# Patient Record
Sex: Female | Born: 1940 | Race: White | Hispanic: No | Marital: Married | Smoking: Never smoker
Health system: Southern US, Academic
[De-identification: ages and names within clinical notes are randomized; demographics above are authoritative.]

## PROBLEM LIST (undated history)

## (undated) DIAGNOSIS — I493 Ventricular premature depolarization: Secondary | ICD-10-CM

## (undated) DIAGNOSIS — I4891 Unspecified atrial fibrillation: Secondary | ICD-10-CM

## (undated) DIAGNOSIS — I480 Paroxysmal atrial fibrillation: Secondary | ICD-10-CM

## (undated) DIAGNOSIS — N189 Chronic kidney disease, unspecified: Secondary | ICD-10-CM

## (undated) DIAGNOSIS — Z87898 Personal history of other specified conditions: Secondary | ICD-10-CM

## (undated) DIAGNOSIS — M069 Rheumatoid arthritis, unspecified: Secondary | ICD-10-CM

## (undated) DIAGNOSIS — E782 Mixed hyperlipidemia: Secondary | ICD-10-CM

## (undated) DIAGNOSIS — M1990 Unspecified osteoarthritis, unspecified site: Secondary | ICD-10-CM

## (undated) DIAGNOSIS — Z8669 Personal history of other diseases of the nervous system and sense organs: Secondary | ICD-10-CM

## (undated) DIAGNOSIS — Z8739 Personal history of other diseases of the musculoskeletal system and connective tissue: Secondary | ICD-10-CM

## (undated) DIAGNOSIS — Z87442 Personal history of urinary calculi: Secondary | ICD-10-CM

## (undated) DIAGNOSIS — G4730 Sleep apnea, unspecified: Secondary | ICD-10-CM

## (undated) DIAGNOSIS — D649 Anemia, unspecified: Secondary | ICD-10-CM

## (undated) DIAGNOSIS — M810 Age-related osteoporosis without current pathological fracture: Secondary | ICD-10-CM

## (undated) DIAGNOSIS — I10 Essential (primary) hypertension: Secondary | ICD-10-CM

## (undated) DIAGNOSIS — N200 Calculus of kidney: Secondary | ICD-10-CM

## (undated) DIAGNOSIS — K5790 Diverticulosis of intestine, part unspecified, without perforation or abscess without bleeding: Secondary | ICD-10-CM

## (undated) DIAGNOSIS — L900 Lichen sclerosus et atrophicus: Secondary | ICD-10-CM

## (undated) DIAGNOSIS — E079 Disorder of thyroid, unspecified: Secondary | ICD-10-CM

## (undated) DIAGNOSIS — I482 Chronic atrial fibrillation, unspecified: Secondary | ICD-10-CM

## (undated) DIAGNOSIS — M797 Fibromyalgia: Secondary | ICD-10-CM

## (undated) HISTORY — PX: WRIST ARTHROSCOPY: SHX838

## (undated) HISTORY — PX: TONSILLECTOMY: SUR1361

## (undated) HISTORY — DX: Essential (primary) hypertension: I10

## (undated) HISTORY — DX: Age-related osteoporosis without current pathological fracture: M81.0

## (undated) HISTORY — DX: Personal history of other diseases of the nervous system and sense organs: Z86.69

## (undated) HISTORY — DX: Chronic kidney disease, unspecified: N18.9

## (undated) HISTORY — DX: Sleep apnea, unspecified: G47.30

## (undated) HISTORY — PX: HX PARATHYROIDECTOMY: SHX19

## (undated) HISTORY — PX: HX TUBAL LIGATION: SHX77

## (undated) HISTORY — DX: Personal history of other specified conditions: Z87.898

## (undated) HISTORY — DX: Paroxysmal atrial fibrillation: I48.0

## (undated) HISTORY — DX: Disorder of thyroid, unspecified: E07.9

## (undated) HISTORY — DX: Anemia, unspecified: D64.9

## (undated) HISTORY — DX: Rheumatoid arthritis, unspecified: M06.9

## (undated) HISTORY — PX: LITHOTRIPSY: SUR834

## (undated) HISTORY — DX: Personal history of urinary calculi: Z87.442

## (undated) HISTORY — DX: Unspecified osteoarthritis, unspecified site: M19.90

## (undated) HISTORY — DX: Unspecified atrial fibrillation: I48.91

## (undated) HISTORY — DX: Diverticulosis of intestine, part unspecified, without perforation or abscess without bleeding: K57.90

## (undated) HISTORY — DX: Lichen sclerosus et atrophicus: L90.0

## (undated) HISTORY — DX: Calculus of kidney: N20.0

## (undated) HISTORY — PX: HX TONSIL AND ADENOIDECTOMY: SHX28

## (undated) HISTORY — PX: HX PARTIAL HYSTERECTOMY: SHX80

## (undated) HISTORY — DX: Ventricular premature depolarization: I49.3

## (undated) HISTORY — DX: Personal history of other diseases of the musculoskeletal system and connective tissue: Z87.39

## (undated) HISTORY — PX: HX PARTIAL THYROIDECTOMY: SHX18

## (undated) HISTORY — PX: HX WRIST FRACTURE TX: SHX121

## (undated) HISTORY — PX: HX CYST INCISION AND DRAINAGE: SHX14

## (undated) HISTORY — DX: Mixed hyperlipidemia: E78.2

---

## 1978-12-01 HISTORY — PX: HX HYSTERECTOMY: SHX81

## 2000-12-01 HISTORY — PX: HX BREAST BIOPSY: SHX20

## 2010-10-22 ENCOUNTER — Encounter (INDEPENDENT_AMBULATORY_CARE_PROVIDER_SITE_OTHER): Payer: Medicare Other | Admitting: Endocrinology, Diabetes, & Metabolism

## 2010-11-04 NOTE — Progress Notes (Signed)
[facility]  [address]  [place] [address]  [phone]    Patient Name: Cynthia Barrera  MRN# [id number]    Date of Service: 11/05/2010    Chief Complaint: pt states has been on plaquenil x 10 years. Stopped taking meds after she starting having problems with her vision. Stopped it back in 9/11. Pt states that back in the spring time she got new RX with little help. Over the summer she notice seeing light and dark spots all the time. Pt notice having problems see in the dark. Had to stop driving at nighttime.     Past History  No current outpatient prescriptions on file.       Allergies not on file  No past medical history on file.  No past surgical history on file.  Family History  No family history on file.  Social History  History   Substance Use Topics   . Smoking status: Not on file   . Smokeless tobacco: Not on file   . Alcohol Use: Not on file       Review of [place], [place] 11/05/2010, 1:08 PM    Terisa Starr, MD 11/04/2010, 6:19 PM    A/P  Pt on Plaquenil, here for ganzfeld ERG per dr Lowell Guitar. 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   Has kidney problems, too much calcium in the blood with kidneystones. Could have predisposed to ? Toxicity, do not know what function is.   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    Pt has no family members with retinal dystrophies.   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  F/u with me in the summer, she prefers to come here, EOG and [place], will need GONIO upon return Letter to dr Lowell Guitar,  . Terisa Starr, MD 11/05/2010, 4:55 PM

## 2010-11-05 ENCOUNTER — Ambulatory Visit
Admission: RE | Admit: 2010-11-05 | Discharge: 2010-11-05 | Disposition: A | Discharge status: Home / Self Care | Payer: Medicare Other | Source: Ambulatory Visit | Attending: Ophthalmology | Admitting: Ophthalmology

## 2010-11-05 ENCOUNTER — Ambulatory Visit (HOSPITAL_COMMUNITY)
Admission: RE | Admit: 2010-11-05 | Discharge: 2010-11-05 | Disposition: A | Discharge status: Home / Self Care | Payer: Medicare Other | Source: Ambulatory Visit

## 2010-11-05 ENCOUNTER — Encounter (INDEPENDENT_AMBULATORY_CARE_PROVIDER_SITE_OTHER): Payer: Self-pay | Admitting: Ophthalmology

## 2010-11-05 ENCOUNTER — Ambulatory Visit (HOSPITAL_COMMUNITY)
Admission: RE | Admit: 2010-11-05 | Discharge: 2010-11-05 | Disposition: A | Discharge status: Home / Self Care | Payer: Medicare Other | Source: Ambulatory Visit | Attending: Ophthalmology | Admitting: Ophthalmology

## 2010-11-05 ENCOUNTER — Ambulatory Visit (INDEPENDENT_AMBULATORY_CARE_PROVIDER_SITE_OTHER): Payer: Medicare Other | Admitting: Ophthalmology

## 2010-11-05 DIAGNOSIS — M810 Age-related osteoporosis without current pathological fracture: Secondary | ICD-10-CM | POA: Insufficient documentation

## 2010-11-05 DIAGNOSIS — N200 Calculus of kidney: Secondary | ICD-10-CM | POA: Insufficient documentation

## 2010-11-05 DIAGNOSIS — E039 Hypothyroidism, unspecified: Secondary | ICD-10-CM | POA: Insufficient documentation

## 2010-11-05 DIAGNOSIS — I10 Essential (primary) hypertension: Secondary | ICD-10-CM | POA: Insufficient documentation

## 2010-11-05 DIAGNOSIS — Z5181 Encounter for therapeutic drug level monitoring: Secondary | ICD-10-CM | POA: Insufficient documentation

## 2010-11-05 DIAGNOSIS — E785 Hyperlipidemia, unspecified: Secondary | ICD-10-CM | POA: Insufficient documentation

## 2010-11-05 DIAGNOSIS — H5359 Other color vision deficiencies: Secondary | ICD-10-CM | POA: Insufficient documentation

## 2010-11-05 NOTE — Procedures (Signed)
Flying saucer sign, subtle RPE  Changes OU. No CME, no ERM, VMT OU. 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 Bull's eye appearance.Terisa Starr, MD 11/05/2010, 4:12 PM

## 2010-11-05 NOTE — Procedures (Signed)
Electrophysiology does show almost flat scotopic responses, about 15 % of normal. Generally normal for peak times. Light adapted within normal limits for amplitudes but delayed peak times. Terisa Starr, MD 11/05/2010, 6:36 PM

## 2010-11-05 NOTE — Procedures (Signed)
Blunted reflex OU. Normal optic nerve, no pigment migration, no bone spics.   Neil Crouch, MD 11/05/2010, 5:33 PM    I saw and examined the patient.  I reviewed the resident's note.  I agree with the findings and plan of care as documented in the resident's note.  Any exceptions/additions are edited/noted.    Terisa Starr, MD 11/05/2010, 6:31 PM

## 2010-12-12 ENCOUNTER — Ambulatory Visit (INDEPENDENT_AMBULATORY_CARE_PROVIDER_SITE_OTHER): Payer: Self-pay | Admitting: Ophthalmology

## 2011-01-06 ENCOUNTER — Encounter (INDEPENDENT_AMBULATORY_CARE_PROVIDER_SITE_OTHER): Payer: Medicare Other | Admitting: Ophthalmology

## 2011-01-20 ENCOUNTER — Encounter (INDEPENDENT_AMBULATORY_CARE_PROVIDER_SITE_OTHER): Payer: Medicare Other | Admitting: Endocrinology, Diabetes, & Metabolism

## 2011-01-24 ENCOUNTER — Encounter (INDEPENDENT_AMBULATORY_CARE_PROVIDER_SITE_OTHER): Payer: Medicare Other | Admitting: Endocrinology, Diabetes, & Metabolism

## 2011-04-25 ENCOUNTER — Encounter (INDEPENDENT_AMBULATORY_CARE_PROVIDER_SITE_OTHER): Payer: Medicare Other | Admitting: Endocrinology, Diabetes, & Metabolism

## 2011-05-08 ENCOUNTER — Encounter (INDEPENDENT_AMBULATORY_CARE_PROVIDER_SITE_OTHER): Payer: Medicare Other | Admitting: Endocrinology, Diabetes, & Metabolism

## 2011-05-09 ENCOUNTER — Encounter (INDEPENDENT_AMBULATORY_CARE_PROVIDER_SITE_OTHER): Payer: Medicare Other | Admitting: Endocrinology, Diabetes, & Metabolism

## 2011-05-13 ENCOUNTER — Encounter (INDEPENDENT_AMBULATORY_CARE_PROVIDER_SITE_OTHER): Payer: Medicare Other | Admitting: Ophthalmology

## 2011-05-20 ENCOUNTER — Encounter (INDEPENDENT_AMBULATORY_CARE_PROVIDER_SITE_OTHER): Payer: Medicare Other | Admitting: Ophthalmology

## 2011-08-07 ENCOUNTER — Encounter (INDEPENDENT_AMBULATORY_CARE_PROVIDER_SITE_OTHER): Payer: Self-pay | Admitting: Endocrinology, Diabetes, & Metabolism

## 2011-08-15 ENCOUNTER — Encounter (INDEPENDENT_AMBULATORY_CARE_PROVIDER_SITE_OTHER): Payer: Self-pay | Admitting: Endocrinology, Diabetes, & Metabolism

## 2011-08-15 ENCOUNTER — Ambulatory Visit (INDEPENDENT_AMBULATORY_CARE_PROVIDER_SITE_OTHER): Payer: Medicare Other | Admitting: Endocrinology, Diabetes, & Metabolism

## 2011-08-15 VITALS — BP 124/78 | HR 68 | Ht 63.0 in | Wt 179.4 lb

## 2011-08-15 MED ORDER — IBUPROFEN 200 MG TABLET
200.0000 mg | ORAL_TABLET | Freq: Two times a day (BID) | ORAL | Status: DC
Start: 2011-08-15 — End: 2012-09-17

## 2011-08-15 MED ORDER — CARVEDILOL 25 MG TABLET
25.0000 mg | ORAL_TABLET | Freq: Two times a day (BID) | ORAL | Status: DC
Start: 2011-08-15 — End: 2011-12-03

## 2011-08-15 MED ORDER — DILTIAZEM CD 240 MG CAPSULE,EXTENDED RELEASE 24 HR
240.0000 mg | ORAL_CAPSULE | Freq: Every day | ORAL | Status: DC
Start: 2011-08-15 — End: 2011-12-03

## 2011-08-15 MED ORDER — SYNTHROID 100 MCG TABLET
100.0000 ug | ORAL_TABLET | Freq: Every day | ORAL | Status: DC
Start: 2011-08-15 — End: 2011-12-03

## 2011-08-15 NOTE — Progress Notes (Signed)
1. Hypertension:    The patient returns for routine follow-up with home blood pressure recordings variable but generally acceptable on continued carvedilol 25 mg BID, Cardizem CD 240 mg and lisinopril 40 mg Qam. Some of the variability may be attributed to persistent use of ibuprofen 200 mg BID which has been utilized in place of plaquenil    2. Hypothyroid, continued Synthroid 100 mcg Qam, stable    3. 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 Plaquenil toxicity:    Cynthia Barrera notes no significant change in visual problems but feels that there may be some worsening. She is scheduled to return for follow-up at [facility] next week with a report to follow.    5. ? Glucose intolerance, random glucose 129 mg/dl    The study was on a non-fasting sample but raises minor concerns due to associated risk factors. Cynthia Barrera will monitor fasting and 2h post-prandial glucoses at home

## 2011-11-14 ENCOUNTER — Encounter (INDEPENDENT_AMBULATORY_CARE_PROVIDER_SITE_OTHER): Payer: Medicare Other | Admitting: Endocrinology, Diabetes, & Metabolism

## 2011-12-03 ENCOUNTER — Ambulatory Visit (INDEPENDENT_AMBULATORY_CARE_PROVIDER_SITE_OTHER): Payer: Medicare Other | Admitting: Endocrinology, Diabetes, & Metabolism

## 2011-12-03 MED ORDER — LISINOPRIL 40 MG TABLET
40.0000 mg | ORAL_TABLET | Freq: Every day | ORAL | Status: DC
Start: 2011-12-03 — End: 2012-03-12

## 2011-12-03 MED ORDER — SYNTHROID 100 MCG TABLET
100.0000 ug | ORAL_TABLET | Freq: Every day | ORAL | Status: DC
Start: 2011-12-03 — End: 2012-03-12

## 2011-12-03 MED ORDER — DILTIAZEM CD 240 MG CAPSULE,EXTENDED RELEASE 24 HR
240.0000 mg | ORAL_CAPSULE | Freq: Every day | ORAL | Status: DC
Start: 2011-12-03 — End: 2012-03-12

## 2011-12-03 MED ORDER — CARVEDILOL 25 MG TABLET
25.0000 mg | ORAL_TABLET | Freq: Two times a day (BID) | ORAL | Status: DC
Start: 2011-12-03 — End: 2012-10-11

## 2011-12-03 NOTE — Progress Notes (Signed)
1. Hypertension:   The patient returns for routine follow-up with home monitoring stable with generally normal pressures and occasional elevations thought to be secondary to increased salt intake such as ham, chocolate covered pretzels, etc. during the recent holidays. She continues carvedilol 25 mg BID, Cardizem CD 240 mg and lisinopril 40 mg Qam. Some of the variability may be attributed to persistent use of ibuprofen 200 mg BID which has been utilized in place of plaquenil. Weight was stable.   2. Hypothyroid, continued Synthroid 100 mcg Qam, stable   3. 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 Plaquenil toxicity:   Cynthia Barrera notes no significant change in visual problems but feels that there may be some worsening. She is scheduled to return for follow-up at [facility] next week with a report to follow (not received). Cynthia Barrera stated that there was "no change" and will return on a yearly basis with her next appointment in September 2013.  5. ? Glucose intolerance, random glucose 129 mg/dl   The study was on a non-fasting sample but raises minor concerns due to associated risk factors. Cynthia Barrera will monitor fasting and 2h post-prandial glucoses at home. The glucose log was reported anecdotally and considered "normal" by the patient. We'll continue weekly home monitoring and recording but also obtain HbA1c with the next lab draw.

## 2012-03-03 ENCOUNTER — Encounter (INDEPENDENT_AMBULATORY_CARE_PROVIDER_SITE_OTHER): Payer: Medicare Other | Admitting: Endocrinology, Diabetes, & Metabolism

## 2012-03-12 ENCOUNTER — Encounter (INDEPENDENT_AMBULATORY_CARE_PROVIDER_SITE_OTHER): Payer: Self-pay | Admitting: Endocrinology, Diabetes, & Metabolism

## 2012-03-12 ENCOUNTER — Ambulatory Visit (INDEPENDENT_AMBULATORY_CARE_PROVIDER_SITE_OTHER): Payer: Medicare Other | Admitting: Endocrinology, Diabetes, & Metabolism

## 2012-03-12 VITALS — BP 144/86 | HR 73 | Ht 64.0 in | Wt 182.0 lb

## 2012-03-12 MED ORDER — DILTIAZEM CD 240 MG CAPSULE,EXTENDED RELEASE 24 HR
240.0000 mg | ORAL_CAPSULE | Freq: Every day | ORAL | Status: DC
Start: 2012-03-12 — End: 2013-02-14

## 2012-03-12 MED ORDER — SYNTHROID 100 MCG TABLET
100.0000 ug | ORAL_TABLET | Freq: Every day | ORAL | Status: DC
Start: 2012-03-12 — End: 2013-02-14

## 2012-03-12 MED ORDER — LISINOPRIL 40 MG TABLET
40.0000 mg | ORAL_TABLET | Freq: Every day | ORAL | Status: DC
Start: 2012-03-12 — End: 2013-02-11

## 2012-03-12 NOTE — Progress Notes (Signed)
 1. Hypertension:   The patient returns for routine follow-up with home monitoring stable with generally normal pressures and occasional elevations thought to be secondary to increased salt intake such as ham, chocolate covered pretzels, etc. during the recent holidays. She continues carvedilol  25 mg BID, Cardizem  CD 240 mg and lisinopril  40 mg Qam. Some of the variability may be attributed to persistent use of ibuprofen  200 mg BID which has been utilized in place of plaquenil. Weight was stable but reflected a two pound increase.  2. Hypothyroid, continued Synthroid  100 mcg Qam, stable   3. Hyperparathyroid, stable, serum Ca++ 10.3 mg/dl   4. Plaquenil toxicity:   Sayana notes no significant change in visual problems but feels that there may be some worsening. She is scheduled to return for follow-up at [facility] with her next appointment in September 2013.  5. ? Glucose intolerance, random glucose 129 mg/dl   The study was on a non-fasting sample but raises minor concerns due to associated risk factors. Kalyani will monitor fasting and 2h post-prandial glucoses at home. The glucose log was reported anecdotally and considered normal by the patient. We'll continue weekly home monitoring and recording noting that HbA1c was reported as 5.8% and BMP was essentially normal with a random glucose of 89 mg/dl.  6. Osteoporosis:  DEXA scan obtained recently was less than ideal interpretation wise but was consistent with osteoporosis with a T score of -2.8 in the left hip area. She also had follow-up mammography with additional follow-up pending due to grey area in the left breast.

## 2012-06-11 ENCOUNTER — Encounter (INDEPENDENT_AMBULATORY_CARE_PROVIDER_SITE_OTHER): Payer: Self-pay | Admitting: Endocrinology, Diabetes, & Metabolism

## 2012-06-21 ENCOUNTER — Encounter (INDEPENDENT_AMBULATORY_CARE_PROVIDER_SITE_OTHER): Payer: Medicare Other | Admitting: Endocrinology, Diabetes, & Metabolism

## 2012-07-29 ENCOUNTER — Encounter (INDEPENDENT_AMBULATORY_CARE_PROVIDER_SITE_OTHER): Payer: Medicare Other | Admitting: Endocrinology, Diabetes, & Metabolism

## 2012-09-17 ENCOUNTER — Encounter (INDEPENDENT_AMBULATORY_CARE_PROVIDER_SITE_OTHER): Payer: Self-pay | Admitting: Endocrinology, Diabetes, & Metabolism

## 2012-09-17 ENCOUNTER — Ambulatory Visit (INDEPENDENT_AMBULATORY_CARE_PROVIDER_SITE_OTHER): Payer: Medicare Other | Admitting: Endocrinology, Diabetes, & Metabolism

## 2012-09-17 VITALS — BP 128/66 | HR 79 | Ht 64.0 in | Wt 180.6 lb

## 2012-09-17 NOTE — Progress Notes (Signed)
 1. Hypertension:   The patient returns for routine follow-up with home monitoring stable with generally normal pressures and occasional elevations thought to be secondary to increased salt intake such as ham, chocolate covered pretzels, etc. during the recent holidays. She continues carvedilol  25 mg BID, Cardizem  CD 240 mg and lisinopril  40 mg Qam. Some of the variability may be attributed to persistent use of ibuprofen  200 mg BID which has been utilized in place of plaquenil. Weight was stable but reflected a two pound increase.  2. Hypothyroid, continued Synthroid  100 mcg Qam, stable with annual studies to be obtained today  3. Hyperparathyroid, stable, serum Ca++ 10.3 mg/dl, status post renal calculi last year  4. Plaquenil toxicity:   Koriana notes no significant change in visual problems but feels that there may be some worsening. She is scheduled to return for follow-up at [facility] with her next appointment in September 2013.  5. ? Glucose intolerance, random glucose 129 mg/dl   The study was on a non-fasting sample but raises minor concerns due to associated risk factors. Shamona will monitor fasting and 2h post-prandial glucoses at home. The glucose log was reported anecdotally and considered normal by the patient. We'll continue weekly home monitoring and recording noting that HbA1c was reported as 5.8% and BMP was essentially normal with a random glucose of 89 mg/dl.  6. Osteoporosis:  DEXA scan obtained recently with less than an ideal interpretation but was consistent with osteoporosis with a T score of -2.8 in the left hip area. She also had follow-up mammography with additional follow-up pending due to grey area in the left breast. Dr. Dagostine suggested she discuss supplements with me during this visit. Vitamin D  levels should be measured but I seriously doubt that calcium supplement is indicated due to established hyperparathyroidism and a recent history of renal calculi

## 2012-10-09 ENCOUNTER — Other Ambulatory Visit (INDEPENDENT_AMBULATORY_CARE_PROVIDER_SITE_OTHER): Payer: Self-pay | Admitting: Endocrinology, Diabetes, & Metabolism

## 2013-02-11 ENCOUNTER — Other Ambulatory Visit (INDEPENDENT_AMBULATORY_CARE_PROVIDER_SITE_OTHER): Payer: Self-pay | Admitting: Endocrinology, Diabetes, & Metabolism

## 2013-02-11 MED ORDER — LISINOPRIL 40 MG TABLET
40.0000 mg | ORAL_TABLET | Freq: Every day | ORAL | Status: DC
Start: 2013-02-11 — End: 2013-08-02

## 2013-02-14 ENCOUNTER — Encounter (INDEPENDENT_AMBULATORY_CARE_PROVIDER_SITE_OTHER): Payer: Self-pay | Admitting: Endocrinology, Diabetes, & Metabolism

## 2013-02-14 ENCOUNTER — Ambulatory Visit (INDEPENDENT_AMBULATORY_CARE_PROVIDER_SITE_OTHER): Payer: Medicare Other | Admitting: Endocrinology, Diabetes, & Metabolism

## 2013-02-14 VITALS — BP 150/82 | HR 79 | Ht 64.0 in | Wt 174.4 lb

## 2013-02-14 NOTE — Progress Notes (Signed)
1. Hypertension:   The patient returns after recent hospitalization at [facility] for apparent bronchitis/pneumonia with the development of new onset atrial fibrillation. As a consequence several medications were changed including discontinuation of carvedilol, cardizem CD and lisinopril. Her husband stopped by the office on Friday (3/14) noting significant BP elevation and not feeling well. I restarted lisinopril and questioned why metoprolol had been substituted for carvedilol. ECHOcardiogram revealed an ejection fraction of 55% without valvular lesions. Medications prescribed for "bronchitis" included promethazine, benzonatate, loratadine and cefuroxime. Note also that serum K+ was 3.2 mEq/L on admission likely due to chronic furosemide use. She has been referred to Dr. Delia Chimes for further evaluation but his office wanted clarification of thyroid status before evaluation. EKG in the office was consistent with sinus bradycardia.  Chest X-ray raised the question of a right middle lobe and lingular infiltrate at [facility] but certainly did not confirm pneumonia. She has now completed her antibiotic course, feels better but still complains of a dry, non-productive cough. Repeat chest PA-lateral will be obtained today.  2. Hypothyroid:  Synthroid was reduced from 100 mcg Qam to 75 mcg Qam based on [facility] studies including TSH 0.474 mIU/mL (normal 0.370-4.420) and free thyroxine 2.10 ng/dL (normal 0.75-2.00). This is a considerably greater reduction than I would have recommended if a reduction was indicated at all. Since she has now taken the 75 mcg dosage for 2 weeks we'll defer testing until an additional six weeks have passed then re-evaluate.  3. Hyperparathyroid, stable, serum Ca++ 10.0 mg/dl, status post renal calculi last year  4. Plaquenil toxicity:    Storme notes no significant change in visual problems but feels that there may be some worsening. She is scheduled to return for follow-up at [facility] with her next appointment in September 2013.  5. ? Glucose intolerance, random glucose 129 mg/dl   The study was on a non-fasting sample but raises minor concerns due to associated risk factors. Mitsy will monitor fasting and 2h post-prandial glucoses at home. The glucose log was reported anecdotally and considered "normal" by the patient. We'll continue weekly home monitoring and recording noting that HbA1c was reported as 5.8% and BMP was essentially normal with a random glucose of 89 mg/dl.  6. Osteoporosis:  DEXA scan obtained recently with less than an ideal interpretation but was consistent with osteoporosis with a T score of -2.8 in the left hip area. She also had follow-up mammography with additional follow-up pending due to "grey" area in the left breast. Dr. Frederic Jericho suggested she discuss "supplements" with me during this visit. Vitamin D levels should be measured but I seriously doubt that calcium supplement is indicated due to established hyperparathyroidism and a recent history of renal calculi

## 2013-03-07 ENCOUNTER — Ambulatory Visit (INDEPENDENT_AMBULATORY_CARE_PROVIDER_SITE_OTHER): Payer: Medicare Other | Admitting: Endocrinology, Diabetes, & Metabolism

## 2013-03-07 ENCOUNTER — Encounter (INDEPENDENT_AMBULATORY_CARE_PROVIDER_SITE_OTHER): Payer: Self-pay | Admitting: Endocrinology, Diabetes, & Metabolism

## 2013-03-07 VITALS — BP 124/70 | HR 55 | Resp 20 | Ht 64.0 in | Wt 172.8 lb

## 2013-03-07 MED ORDER — LEVOTHYROXINE 75 MCG TABLET
75.0000 ug | ORAL_TABLET | Freq: Every day | ORAL | Status: DC
Start: 2013-03-07 — End: 2014-05-16

## 2013-03-07 NOTE — Progress Notes (Signed)
1. Hypertension:   The patient returns after recent hospitalization at [facility] for hypertensive urgency, atrial fibrillation and persistent nephrolithiasis. She has also had at least two falls recently that appeared to be orthostatic in nature. The most recent occurred in the hospital parking lot where she fell face forward when getting out of her car. As a consequence, medications have again been revised with initiation of clonidine 0.1 mg TID, sotalol 80 mg BID and digoxin 0.25 mg QD. Lisinopril 40 mg Qam was continued as was HCTz 25 mg daily and amlodipine 5 mg.  On this regimen, BP control became "too good" and she held the noon dosage of clonidine and the evening dosage of sotalol.   She has been referred to Dr. Delia Chimes for further evaluation but his office wanted clarification of thyroid status before evaluation and has initiated a cardiac monitor which she is currently using. Atrial fibrillation diagnosed during a previous admission to [facility] for bronchitis has not been a problem and sinus rhythm has been maintained. EKG in the office was consistent with sinus bradycardia.  2. Hypothyroid:  Synthroid was reduced from 100 mcg Qam to 75 mcg Qam based on [facility] [place] studies including TSH 0.474 mIU/mL (normal 0.370-4.420) and free thyroxine 2.10 ng/dL (normal 0.75-2.00). This is a considerably greater reduction than I would have recommended if a reduction was indicated at all. Since she has now taken the 75 mcg dosage for 8 weeks - the TSH obtained during the April hospitalization will suffice - TSH was normal at 2.13 uIU/mL.  3. Hyperparathyroid, stable, serum Ca++ 10.0 mg/dl, status post renal calculi last year, Braleigh is a surgical candidate for all intents and purposes but to date has not elected this avenue:  4. Plaquenil toxicity:    Arthi notes no significant change in visual problems but feels that there may be some worsening. She is scheduled to return for follow-up at [facility] with her next appointment in September 2013.  5. ? Glucose intolerance, random glucose 129 mg/dl   The study was on a non-fasting sample but raises minor concerns due to associated risk factors. Merisha will monitor fasting and 2h post-prandial glucoses at home. The glucose log was reported anecdotally and considered "normal" by the patient. We'll continue weekly home monitoring and recording noting that HbA1c was reported as 5.8% and BMP was essentially normal with a random glucose of 89 mg/dl.  6. Osteoporosis:  DEXA scan obtained recently with less than an ideal interpretation but was consistent with osteoporosis with a T score of -2.8 in the left hip area. She also had follow-up mammography with additional follow-up pending due to "grey" area in the left breast. Dr. Frederic Jericho suggested she discuss "supplements" with me during this visit. Vitamin D levels should be measured but I seriously doubt that calcium supplement is indicated due to established hyperparathyroidism and a recent history of renal calculi

## 2013-03-10 ENCOUNTER — Telehealth (INDEPENDENT_AMBULATORY_CARE_PROVIDER_SITE_OTHER): Payer: Self-pay | Admitting: Endocrinology, Diabetes, & Metabolism

## 2013-03-10 DIAGNOSIS — M6250 Muscle wasting and atrophy, not elsewhere classified, unspecified site: Secondary | ICD-10-CM

## 2013-03-10 DIAGNOSIS — R079 Chest pain, unspecified: Secondary | ICD-10-CM

## 2013-03-10 DIAGNOSIS — E860 Dehydration: Secondary | ICD-10-CM

## 2013-03-10 DIAGNOSIS — R112 Nausea with vomiting, unspecified: Secondary | ICD-10-CM

## 2013-03-10 DIAGNOSIS — I4891 Unspecified atrial fibrillation: Secondary | ICD-10-CM

## 2013-03-10 DIAGNOSIS — I10 Essential (primary) hypertension: Secondary | ICD-10-CM

## 2013-03-10 DIAGNOSIS — R109 Unspecified abdominal pain: Secondary | ICD-10-CM

## 2013-03-10 DIAGNOSIS — K5900 Constipation, unspecified: Secondary | ICD-10-CM

## 2013-03-10 NOTE — Telephone Encounter (Signed)
Patient's daughter called and wanted you to know that she is back in the hospital at [facility] in [phone]. They told her that her heart rate is erratic and her blood pressure is all over the place. She also has some inflammation around her pancreas. Her daughter was asking about her renal function, but has been unable to find out if they have checked it or not.

## 2013-03-10 NOTE — Telephone Encounter (Signed)
Melanie:    Baelynn's renal function has been checked several times during her recent hospitalization. On 03/01/13, her BUN was 18, creatinine 0.7 and the estimated GFR > 60 then on 03/09/13, the BUN was 18, creatinine 1.0 and estimated GFR 55. These studies suggested mild renal impairment (CKD III).    Through this office, studies were ordered on 02/14/13 as well as 09/17/13 - the last two times I saw Aislynne in the office. Neither of these studies were obtained after being ordered.     [place]

## 2013-03-15 ENCOUNTER — Telehealth (INDEPENDENT_AMBULATORY_CARE_PROVIDER_SITE_OTHER): Payer: Self-pay | Admitting: Endocrinology, Diabetes, & Metabolism

## 2013-03-15 NOTE — Telephone Encounter (Signed)
Spoke to patient for hospital follow up. She is doing some better. She doesn't need anything at the present time. She is trying to find a family doctor, but hasn't found one yet. She is aware of her follow up appt.

## 2013-03-18 ENCOUNTER — Encounter (INDEPENDENT_AMBULATORY_CARE_PROVIDER_SITE_OTHER): Payer: Medicare Other | Admitting: Endocrinology, Diabetes, & Metabolism

## 2013-03-18 ENCOUNTER — Ambulatory Visit (INDEPENDENT_AMBULATORY_CARE_PROVIDER_SITE_OTHER): Payer: Medicare Other | Admitting: Endocrinology, Diabetes, & Metabolism

## 2013-03-18 VITALS — BP 140/82 | HR 80 | Ht 64.0 in | Wt 171.8 lb

## 2013-03-18 DIAGNOSIS — E213 Hyperparathyroidism, unspecified: Secondary | ICD-10-CM

## 2013-03-18 LAB — BASIC METABOLIC PANEL
BUN: 17 mg/dL — NL (ref 7–18)
CALCIUM: 10.2 mg/dL — ABNORMAL HIGH (ref 8.5–10.1)
CARBON DIOXIDE: 29 mmol/L — NL (ref 21–32)
CHLORIDE: 99 mmoL/L (ref 98–107)
CREATININE: 1.2 mg/dL — NL (ref 0.6–1.3)
ESTIMATED GLOMERULAR FILTRATION RATE: 44 mL/min (ref >60)
GLUCOSE,NONFAST: 153 mg/dL — ABNORMAL HIGH (ref 74–106)
POTASSIUM: 3.4 mmol/L — ABNORMAL LOW (ref 3.5–5.1)
SODIUM: 134 mmol/L — ABNORMAL LOW (ref 136–145)

## 2013-03-18 NOTE — Progress Notes (Signed)
 1. Hypertension:   The patient returns after recent hospitalization at [facility] for hypertensive urgency, atrial fibrillation and persistent nephrolithiasis. She has also had at least two falls recently that appeared to be orthostatic in nature. The most recent occurred in the hospital parking lot where she fell face forward when getting out of her car. As a consequence, medications have again been revised with initiation of clonidine  0.1 mg TID, sotalol  80 mg BID and digoxin 0.25 mg QD. Lisinopril  40 mg Qam was continued as was HCTz 25 mg daily and amlodipine  5 mg. On this regimen, BP control became too good and she held the noon dosage of clonidine  and the evening dosage of sotalol .   She has been referred to Dr. Maurie for further evaluation but his office wanted clarification of thyroid  status before evaluation and has initiated a cardiac monitor which she is currently using. Atrial fibrillation diagnosed during a previous admission to [facility] for bronchitis has not been a problem and sinus rhythm has been maintained. EKG in the office was consistent with sinus bradycardia.  Since the above was recorded, Cynthia Barrera was again evaluated per ED with dig toxicity documented. She has since discontinued digoxin and done considerably better. BP, if anything, have been lower and Cynthia Barrera keeps jiggling her medications. Her husband is keeping a list of what she is not taking on a day-to-day basis. Hopefully, we can stabilize the medications once again and maintain satisfactory BP control. I've asked Cynthia Barrera to bring her BP cuff to her next appointment for comparison.  2. Hypothyroid:  Synthroid  was reduced from 100 mcg Qam to 75 mcg Qam based on [facility] studies including TSH 0.474 mIU/mL (normal 0.370-4.420) and free thyroxine 2.10 ng/dL (normal 9.24-7.99). This is a considerably greater reduction than I would have recommended if a reduction was indicated at all. Since she has now taken the 75 mcg dosage  for 8 weeks - the TSH obtained during the April hospitalization will suffice - TSH was normal at 2.13 uIU/mL.  3. Hyperparathyroid, stable:  Serum Ca++ 10.0 mg/dl, status post renal calculi last year with a recurrent stone awaiting lithotripsy. There is no question that Cynthia Barrera is a surgical candidate for all intents and purposes but to date has not elected this avenue. We'll repeat serum calcium today and evaluate renal function with potential parathyroid  scan on return.  4. Plaquenil toxicity:   Cynthia Barrera notes no significant change in visual problems but feels that there may be some worsening. She is scheduled to return for follow-up at [facility] with consideration for cataract removal if feasible  5. ? Glucose intolerance, random glucose 129 mg/dl   The study was on a non-fasting sample but raises minor concerns due to associated risk factors. Cynthia Barrera will monitor fasting and 2h post-prandial glucoses at home. The glucose log was reported anecdotally and considered normal by the patient. We'll continue weekly home monitoring and recording noting that HbA1c was reported as 5.8% and BMP was essentially normal with a random glucose of 89 mg/dl.  6. Osteoporosis:  DEXA scan obtained recently with less than an ideal interpretation but was consistent with osteoporosis with a T score of -2.8 in the left hip area. She also had follow-up mammography with additional follow-up pending due to grey area in the left breast. Dr. Dagostine suggested she discuss supplements with me during this visit. Vitamin D  levels should be measured but I seriously doubt that calcium supplement is indicated due to established hyperparathyroidism and a recent history of renal calculi

## 2013-04-08 ENCOUNTER — Other Ambulatory Visit (INDEPENDENT_AMBULATORY_CARE_PROVIDER_SITE_OTHER): Payer: Self-pay | Admitting: Endocrinology, Diabetes, & Metabolism

## 2013-04-08 MED ORDER — METOPROLOL SUCCINATE ER 25 MG TABLET,EXTENDED RELEASE 24 HR
25.0000 mg | ORAL_TABLET | Freq: Every day | ORAL | Status: DC
Start: 2013-04-08 — End: 2013-08-02

## 2013-04-08 MED ORDER — SOTALOL 80 MG TABLET
80.0000 mg | ORAL_TABLET | Freq: Two times a day (BID) | ORAL | Status: DC
Start: 2013-04-08 — End: 2016-03-27

## 2013-04-08 MED ORDER — HYDROCHLOROTHIAZIDE 25 MG TABLET
25.0000 mg | ORAL_TABLET | Freq: Every day | ORAL | Status: DC
Start: 2013-04-08 — End: 2014-10-10

## 2013-04-29 ENCOUNTER — Encounter (INDEPENDENT_AMBULATORY_CARE_PROVIDER_SITE_OTHER): Payer: Self-pay | Admitting: Endocrinology, Diabetes, & Metabolism

## 2013-04-29 ENCOUNTER — Ambulatory Visit (INDEPENDENT_AMBULATORY_CARE_PROVIDER_SITE_OTHER): Payer: Medicare Other | Admitting: Endocrinology, Diabetes, & Metabolism

## 2013-04-29 VITALS — BP 130/78 | HR 60 | Ht 64.0 in | Wt 171.4 lb

## 2013-04-29 NOTE — Progress Notes (Signed)
 1. Hypertension:   The patient returns after recent hospitalization at [facility] for hypertensive urgency, atrial fibrillation and persistent nephrolithiasis. Since her last visit, with medication adjustment, she has done considerably better with stable home BP monitoring and no further falls. Arayna continues sotalol  80 mg BID, lisinopril  40 mg Qam and HCTz 25 mg daily. Note that clonidine  and amlodipine  have been stopped. On this regimen, BP control reviewed through her daily log has essentially returned to normal with only a few, transient exeptions. She continues follow-up with Dr. Maurie for further evaluation but his office wanted clarification of thyroid  status before evaluation and has initiated a cardiac monitor which she is currently using. Atrial fibrillation diagnosed during a previous admission to [facility] for bronchitis has not been a problem and sinus rhythm has been maintained.   2. Hypothyroid:  Synthroid  was reduced from 100 mcg Qam to 75 mcg Qam based on [facility] studies including TSH 0.474 mIU/mL (normal 0.370-4.420) and free thyroxine 2.10 ng/dL (normal 9.24-7.99). This is a considerably greater reduction than I would have recommended if a reduction was indicated at all. Since she has now taken the 75 mcg dosage for 8 weeks - the TSH obtained during the April hospitalization will suffice - TSH was normal at 2.13 uIU/mL. Further confirmation will be considered on return.  3. Hyperparathyroid, stable, serum Ca++ 10.2 mg/dl, status post renal calculi last year, Skyleigh is a surgical candidate for all intents and purposes but to date has not elected this avenue:  4. Plaquenil toxicity:   Dominic notes no significant change in visual problems but feels that there may be some worsening. She is scheduled to return for follow-up at [facility] with her next appointment in September 2013.  5. ? Glucose intolerance, random glucose 129 mg/dl   The study was on a non-fasting sample but raises  minor concerns due to associated risk factors. Heidee will monitor fasting and 2h post-prandial glucoses at home. The glucose log was reported anecdotally and considered normal by the patient. We'll continue weekly home monitoring and recording noting that HbA1c was reported as 5.8% and BMP was essentially normal with a random glucose of 89 mg/dl.  6. Osteoporosis:  DEXA scan obtained recently with less than an ideal interpretation but was consistent with osteoporosis with a T score of -2.8 in the left hip area. She also had follow-up mammography with additional follow-up pending due to grey area in the left breast. Dr. Dagostine suggested she discuss supplements with me during this visit. Vitamin D  levels should be measured but I seriously doubt that calcium supplement is indicated due to established hyperparathyroidism and a recent history of renal calculi.  7. Renal calculi, secondary to hyperparathyroidism:  Grier is scheduled to undergo urethral dilatation as well as lithotripsy within the next week with antiplatelet therapy to be discussed with Dr. Belle office.

## 2013-06-09 ENCOUNTER — Other Ambulatory Visit (INDEPENDENT_AMBULATORY_CARE_PROVIDER_SITE_OTHER): Payer: Self-pay | Admitting: Endocrinology, Diabetes, & Metabolism

## 2013-06-09 MED ORDER — CLONIDINE HCL 0.1 MG TABLET
0.10 mg | ORAL_TABLET | Freq: Two times a day (BID) | ORAL | Status: DC
Start: 2013-06-09 — End: 2014-05-16

## 2013-06-30 ENCOUNTER — Ambulatory Visit (INDEPENDENT_AMBULATORY_CARE_PROVIDER_SITE_OTHER): Payer: Self-pay | Admitting: Internal Medicine

## 2013-08-02 ENCOUNTER — Ambulatory Visit (INDEPENDENT_AMBULATORY_CARE_PROVIDER_SITE_OTHER): Payer: Medicare Other | Admitting: Endocrinology, Diabetes, & Metabolism

## 2013-08-02 ENCOUNTER — Encounter (INDEPENDENT_AMBULATORY_CARE_PROVIDER_SITE_OTHER): Payer: Self-pay | Admitting: Endocrinology, Diabetes, & Metabolism

## 2013-08-02 VITALS — BP 122/80 | HR 83 | Ht 64.0 in | Wt 174.4 lb

## 2013-08-02 LAB — BASIC METABOLIC PANEL
BUN: 28 mg/dL — ABNORMAL HIGH (ref 7–18)
CALCIUM: 10.1 mg/dL (ref 8.5–10.1)
CARBON DIOXIDE: 27 mmoL/L (ref 21–32)
CHLORIDE: 102 mmol/L (ref 98–107)
CREATININE: 1.3 mg/dL (ref 0.6–1.3)
ESTIMATED GLOMERULAR FILTRATION RATE: 40 mL/min (ref >60)
GLUCOSE,NONFAST: 78 mg/dL (ref 74–106)
POTASSIUM: 4 mmol/L (ref 3.5–5.1)
SODIUM: 136 mmoL/L (ref 136–145)

## 2013-08-02 LAB — HGA1C (HEMOGLOBIN A1C WITH EST AVG GLUCOSE): HEMOGLOBIN A1C: 6.5 %

## 2013-08-02 NOTE — Progress Notes (Signed)
1. Hypertension:   The patient returns after recent hospitalization at [facility] for hypertensive urgency, atrial fibrillation and persistent nephrolithiasis. Since her last visit, with medication adjustment, she has done considerably better with stable home BP monitoring and no further falls. Cynthia Barrera continues sotalol 80 mg BID, lisinopril 40 mg Qam and HCTz 25 mg daily. Note that clonidine and amlodipine have been stopped. On this regimen, BP control reviewed through her daily log has essentially returned to normal with only a few, transient exeptions. She continues follow-up with Dr. Delia Chimes for further evaluation but his office wanted clarification of thyroid status before evaluation and has initiated a cardiac monitor which she is currently using. Atrial fibrillation diagnosed during a previous admission to [place] for bronchitis has not been a problem and sinus rhythm has been maintained.   Dr. Georg Ruddle office stopped HCTz, Toprol and lisinopril and started Tenoretic 50/25 1/2 tablet Qam, Azor 10/40 mg daily and Aldactone 25 mg 1/2 tab daily but when these were taken there was a hypotensive response therefore she stopped them and returned to [facility] and sotalol with clonidine on a prn basis when BP's are greater than 150/90 mmHg. Blood pressure today was 122/80 mmHg. In addition to her decision to change therapies, another factor was the discontinuation of soft drinks, i.e., diet coke, with a reduced sodium load. .  2. Hypothyroid:    Synthroid was reduced from 100 mcg Qam to 75 mcg Qam based on [place] studies including TSH 0.474 mIU/mL (normal 0.370-4.420) and free thyroxine 2.10 ng/dL (normal 0.75-2.00). This is a considerably greater reduction than I would have recommended if a reduction was indicated at all. Since she has now taken the 75 mcg dosage for 8 weeks - the TSH obtained during the April hospitalization will suffice - TSH was normal at 2.13 uIU/mL. Further confirmation will be considered on return.  3. 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 Plaquenil toxicity:   Cynthia Barrera notes no significant change in visual problems but feels that there may be some worsening. She is scheduled to return for follow-up at [facility] with her next appointment in September 2013.  5. ? Glucose intolerance, random glucose 129 mg/dl   The study was on a non-fasting sample but raises minor concerns due to associated risk factors. Cynthia Barrera will monitor fasting and 2h post-prandial glucoses at home. The glucose log was reported anecdotally and considered "normal" by the patient. We'll continue weekly home monitoring and recording noting that HbA1c was reported as 5.8% and BMP was essentially normal with a random glucose of 89 mg/dl.  6. Osteoporosis:  DEXA scan obtained recently with less than an ideal interpretation but was consistent with osteoporosis with a T score of -2.8 in the left hip area. She also had follow-up mammography with additional follow-up pending due to "grey" area in the left breast. Dr. Frederic Jericho suggested she discuss "supplements" with me during this visit. Vitamin D levels should be measured but I seriously doubt that calcium supplement is indicated due to established hyperparathyroidism and a recent history of renal calculi. A repeat serum calcium will be obtained today.   7. Renal calculi, secondary to hyperparathyroidism:  Cynthia Barrera was scheduled to undergo urethral dilatation as well as lithotripsy with antiplatelet therapy to be discussed with Dr. Georg Ruddle office.

## 2013-09-15 ENCOUNTER — Ambulatory Visit (INDEPENDENT_AMBULATORY_CARE_PROVIDER_SITE_OTHER): Payer: Medicare Other | Admitting: Internal Medicine

## 2013-09-15 ENCOUNTER — Encounter (INDEPENDENT_AMBULATORY_CARE_PROVIDER_SITE_OTHER): Payer: Self-pay | Admitting: Internal Medicine

## 2013-09-15 VITALS — BP 160/78 | HR 61 | Ht 64.0 in | Wt 180.0 lb

## 2013-09-15 DIAGNOSIS — R5383 Other fatigue: Secondary | ICD-10-CM | POA: Insufficient documentation

## 2013-09-15 DIAGNOSIS — E213 Hyperparathyroidism, unspecified: Secondary | ICD-10-CM

## 2013-09-15 DIAGNOSIS — I10 Essential (primary) hypertension: Secondary | ICD-10-CM

## 2013-09-15 DIAGNOSIS — I4891 Unspecified atrial fibrillation: Secondary | ICD-10-CM

## 2013-09-15 DIAGNOSIS — E039 Hypothyroidism, unspecified: Secondary | ICD-10-CM

## 2013-09-15 DIAGNOSIS — G4700 Insomnia, unspecified: Secondary | ICD-10-CM

## 2013-09-15 DIAGNOSIS — R5381 Other malaise: Secondary | ICD-10-CM

## 2013-09-15 DIAGNOSIS — M810 Age-related osteoporosis without current pathological fracture: Secondary | ICD-10-CM

## 2013-09-15 NOTE — Progress Notes (Signed)
 [facility] MEDICINE AND SPECIALTY OFFICE  [facility]    Cynthia Barrera  Date of Service: 09/15/2013    Chief Complaint:   Chief Complaint   Patient presents with   . Fatigue       History  HPI  Here for new pt visit     With BM's have cramping for 1-2 hrs about q 2 weeks. Bowels soft. S/p c-scope about 3 yrs ago neg x tics. By dr henry.  Cramping worse with coconut, corn  And nuts increase cramping.  BM's q 2-3 days. Prior to dx of Afib was having BM's daily and abdominal cramping q2-3 months.    Bed at 11pm fall asleep immediately, get up at 730-8am. Post falling asleep awake up q2 hrs not sure why 3/4 of the time can fall back to sleep easily, rest of the time takes 2-3 hrs to fall asleep.      Awake in the am fatigue.  Fall asleep if watch tv regardless of what watching.  Does not think she snores.     Poor energy for past 1-2 yrs.  .    ROS    Examination  Vitals: BP 160/78  Pulse 61  Ht 1.626 m (5' 4)  Wt 81.647 kg (180 lb)  BMI 30.88 kg/m2  Physical Exam   Constitutional: She is oriented to person, place, and time. She appears well-developed and well-nourished.   HENT:   Head: Normocephalic and atraumatic.   Right Ear: External ear normal.   Left Ear: External ear normal.   Small post pharnxy   Eyes: Conjunctivae and EOM are normal. Pupils are equal, round, and reactive to light. Right eye exhibits no discharge. Left eye exhibits no discharge.   Neck: Normal range of motion. Neck supple. No JVD present. No thyromegaly present.   Cardiovascular: Normal rate, regular rhythm and normal heart sounds.    No murmur heard.  Pulm:  Effort normal and breath sounds normal.    There are no wheezes present.    Abdomen:   Bowel sounds are normal. She exhibits no mass. Soft. No tenderness. She has no rebound.   Ortho/Musculoskeletal:   Normal range of motion. She exhibits no edema.   Lymphadenopathy:     She has no cervical adenopathy.   Neurological: She is oriented to person, place, and time.   Vitals  reviewed.    .    Results  8/14 bmp normal x ca 11.1  4/14 INR 0.96 cbc normal 3/14 c/t/h/l 143/83/47/79  3/14 A1C 6/2 TSH 2/07  Diagnosis and Plan  1. Fatigue    2. HTN (hypertension)    3. Insomnia    4. Hypothyroid    5. Osteoporosis    6. Hyperparathyroidism    7. Atrial fibrillation    1. Fatigue  Unclear source but with excessive day time fatigue check labs and refer to pulm for sleep study in [place] due to pt from their and pt prefers  - METHYLMALONIC ACID (MMA), QUANTITATIVE, PLASMA; Future  - TSH; Future  - Refer to Pulmonology  - METHYLMALONIC ACID (MMA), QUANTITATIVE, PLASMA  - TSH    2. HTN (hypertension)  Increase today, but new pt visit and pt nervious will have her check BP at home and let me know results  - TSH; Future  - LIPID PANEL; Future  - TSH  - LIPID PANEL    3. Insomnia  See number 1  - Refer to Pulmonology    4. Hypothyroid  Recheck tsh  - METHYLMALONIC ACID (MMA), QUANTITATIVE, PLASMA; Future  - METHYLMALONIC ACID (MMA), QUANTITATIVE, PLASMA    5. Osteoporosis  States talked to dr grubb but at that point plan was not to place on anything.  Pt with hx of hyper calcemia with hx of renal stone, to see dr nada in dec to d/w him about surgery for parathyroid .     6. Hyperparathyroidism  To d/w dr nada in dec    7. Atrial fibrillation  Reg rate today monitor.   Orders Placed This Encounter   . METHYLMALONIC ACID (MMA), QUANTITATIVE, PLASMA   . TSH   . LIPID PANEL   . Refer to Pulmonology       Cynthia Stank, DO

## 2013-09-23 LAB — LIPID PANEL
CHOLESTEROL: 164 mg/dL
HDL-CHOLESTEROL: 64 mg/dL
LDL (CALCULATED): 85 mg/dL
TRIGLYCERIDES: 75 mg/dL
VLDL (CALCULATED): 15 mg/dL

## 2013-09-23 LAB — THYROID STIMULATING HORMONE (SENSITIVE TSH): TSH: 1.49 u[IU]/mL (ref 0.350–5.550)

## 2013-09-28 ENCOUNTER — Telehealth (INDEPENDENT_AMBULATORY_CARE_PROVIDER_SITE_OTHER): Payer: Self-pay | Admitting: Internal Medicine

## 2013-09-28 NOTE — Telephone Encounter (Signed)
mma increased at 0.82 called pt to take B12 OTC 2000mg  po daily x 1 month trhen daily x 1 month may help with energy pt agree

## 2013-11-01 ENCOUNTER — Encounter (INDEPENDENT_AMBULATORY_CARE_PROVIDER_SITE_OTHER): Payer: Self-pay | Admitting: Endocrinology, Diabetes, & Metabolism

## 2013-11-01 ENCOUNTER — Ambulatory Visit (INDEPENDENT_AMBULATORY_CARE_PROVIDER_SITE_OTHER): Payer: Medicare Other | Admitting: Endocrinology, Diabetes, & Metabolism

## 2013-11-01 VITALS — BP 142/86 | HR 79 | Ht 64.0 in | Wt 177.0 lb

## 2013-11-01 MED ORDER — LISINOPRIL 40 MG TABLET
40.0000 mg | ORAL_TABLET | Freq: Every day | ORAL | Status: DC
Start: 2013-11-01 — End: 2014-10-10

## 2013-11-01 NOTE — Progress Notes (Signed)
1. Hypertension:   The patient returns after recent hospitalization at [facility] for hypertensive urgency, atrial fibrillation and persistent nephrolithiasis. Since her last visit, with medication adjustment, she has done considerably better with stable home BP monitoring and no further falls. Cynthia Barrera continues sotalol 80 mg BID, lisinopril 40 mg Qam and HCTz 25 mg daily. Note that clonidine and amlodipine have been stopped. On this regimen, BP control reviewed through her daily log has essentially returned to normal with only a few, transient exeptions. She continues follow-up with Dr. Delia Chimes for further evaluation but his office wanted clarification of thyroid status before evaluation and has initiated a cardiac monitor which she is currently using. Atrial fibrillation diagnosed during a previous admission to [place] for bronchitis has not been a problem and sinus rhythm has been maintained.   Dr. Georg Ruddle office stopped HCTz, Toprol and lisinopril and started Tenoretic 50/25 1/2 tablet Qam, Azor 10/40 mg daily and Aldactone 25 mg 1/2 tab daily but when these were taken there was a hypotensive response therefore she stopped them and returned to [facility] and sotalol with clonidine on a prn basis when BP's are greater than 150/90 mmHg. Blood pressure today was 142/86 mmHg. In addition to her decision to change therapies, another factor was the discontinuation of soft drinks, i.e., diet coke, with a reduced sodium load. .  Home monitoring has been obtained as much as five times daily with the expected variation - the trend however was for higher pressures in the evening than the morning. Cynthia Barrera also continues to use clonidine as a prn medication although I do not feel this is indicated and may contribute to "rebound" effects. The medication has also contributed to dryness of the mucous membranes as well as drowziness. We'll resume lisinopril 40 mg Qam, monitor her BMP and discontinue clonidine as a prn med.  2.  Hypothyroid:   Synthroid was reduced from 100 mcg Qam to 75 mcg Qam based on [place] studies including TSH 0.474 mIU/mL (normal 0.370-4.420) and free thyroxine 2.10 ng/dL (normal 0.75-2.00). This is a considerably greater reduction than I would have recommended if a reduction was indicated at all. Since she has now taken the 75 mcg dosage for 8 weeks - the TSH obtained during the April hospitalization will suffice - TSH was normal at 2.13 uIU/mL. Further confirmation will be considered on return.  3. 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 Plaquenil toxicity:   Cynthia Barrera notes no significant change in visual problems but feels that there may be some worsening. She is scheduled to return for follow-up at [facility] with her next appointment in September 2013.  5. ? Glucose intolerance, random glucose 129 mg/dl   The study was on a non-fasting sample but raises minor concerns due to associated risk factors. Cynthia Barrera will monitor fasting and 2h post-prandial glucoses at home. The glucose log was reported anecdotally and considered "normal" by the patient. We'll continue weekly home monitoring and recording noting that HbA1c was reported as 5.8% and BMP was essentially normal with a random glucose of 89 mg/dl.  6. Osteoporosis:  DEXA scan obtained recently with less than an ideal interpretation but was consistent with osteoporosis with a T score of -2.8 in the left hip area. She also had follow-up mammography with additional follow-up pending due to "grey" area in the left breast. Dr. Frederic Jericho suggested she discuss "  supplements" with me during this visit. Vitamin D levels should be measured but I seriously doubt that calcium supplement is indicated due to established hyperparathyroidism and a recent history of renal calculi. A repeat serum calcium will be obtained  today.  7. Renal calculi, secondary to hyperparathyroidism:  Cynthia Barrera was scheduled to undergo urethral dilatation as well as lithotripsy with antiplatelet therapy to be discussed with Dr. Georg Ruddle office.

## 2013-12-29 ENCOUNTER — Ambulatory Visit (INDEPENDENT_AMBULATORY_CARE_PROVIDER_SITE_OTHER): Payer: Medicare Other | Admitting: Internal Medicine

## 2013-12-29 VITALS — BP 138/80 | HR 63 | Resp 18 | Ht 65.0 in | Wt 179.0 lb

## 2013-12-29 DIAGNOSIS — M179 Osteoarthritis of knee, unspecified: Secondary | ICD-10-CM

## 2013-12-29 DIAGNOSIS — E538 Deficiency of other specified B group vitamins: Principal | ICD-10-CM

## 2013-12-29 DIAGNOSIS — R5383 Other fatigue: Secondary | ICD-10-CM

## 2013-12-29 DIAGNOSIS — R5381 Other malaise: Secondary | ICD-10-CM

## 2013-12-29 DIAGNOSIS — IMO0002 Reserved for concepts with insufficient information to code with codable children: Secondary | ICD-10-CM

## 2013-12-29 DIAGNOSIS — M1710 Unilateral primary osteoarthritis, unspecified knee: Secondary | ICD-10-CM

## 2013-12-29 MED ORDER — DICLOFENAC 1 % TOPICAL GEL
CUTANEOUS | Status: DC
Start: 2013-12-29 — End: 2014-05-18

## 2013-12-31 NOTE — Progress Notes (Signed)
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  Had titration study, but could not tol wearing CPAP to attempt titration study so study d/ced.    Pt has been taking B12 and feels a little better.  Pt states her son ws recently admitted due to sever B12 dec sym now doing better, but on B12 shots.    Has pain in bilat knees mostly when go up or down stairs or on ramps.  No pain with walking on level ground.  .    ROS    Examination  Vitals: BP 138/80   Pulse 63   Resp 18   Ht 1.651 m (5\' 5" )   Wt 81.194 kg (179 lb)   BMI 29.79 kg/m2   SpO2 99%  Physical Exam   Constitutional: She appears well-developed and well-nourished.   HENT:   Head: Normocephalic and atraumatic.   Neck: Neck supple. No tracheal deviation present.   Cardiovascular: Normal rate, regular rhythm and normal heart sounds.    No murmur heard.  Pulm:  Effort normal and breath sounds normal.    There are no wheezes present.    Abdomen:   Bowel sounds are normal. Soft. No tenderness.   Ortho/Musculoskeletal:   She exhibits tenderness. She exhibits no edema.   DJD changes of bilat knee no effusion. Lig intact.   Lymphadenopathy:     She has no cervical adenopathy.   Vitals reviewed.    .    Results    Diagnosis and Plan  1. B12 deficiency    2. 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 B12 deficiency  On oral B12, but with hx of son with sever B12 def reqiuring shots will recheck to see if mma decreasing. If not then will need B12 shots  - METHYLMALONIC ACID (MMA), QUANTITATIVE, PLASMA; Future  - METHYLMALONIC ACID (MMA), QUANTITATIVE, PLASMA    2. 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  - diclofenac sodium (VOLTAREN) 1 % Gel; Apply 2 gm of cream to each knee 3 x day for pain  Dispense: 200 g; Refill: 11  Orders Placed This Encounter    METHYLMALONIC ACID (MMA), QUANTITATIVE, PLASMA    diclofenac sodium (VOLTAREN) 1 % Gel       Darleen Crocker, DO

## 2013-12-31 NOTE — Progress Notes (Deleted)
[place]  Date of Service: 12/31/2013    Chief Complaint:   Chief Complaint   Patient presents with    Skin Problem       History  HPI  New pt visit pt with hx as list in the [place] with recent angiogram to check previously coiled and stented cerebral anyriysam  Past History  Current Outpatient Prescriptions   Medication Sig    apixaban (ELIQUIS) 5 mg Oral Tablet Take 5 mg by mouth Twice daily    aspirin (ECOTRIN) 81 mg Oral Tablet, Delayed Release (E.C.) Take 81 mg by mouth Once a day    cloNIDine (CATAPRES) 0.1 mg Oral Tablet Take 1 Tab (0.1 mg total) by mouth Twice daily    diclofenac sodium (VOLTAREN) 1 % Gel Apply 2 gm of cream to each knee 3 x day for pain    hydrochlorothiazide (HYDRODIURIL) 25 mg Oral Tablet Take 1 Tab (25 mg total) by mouth Once a day    levothyroxine (SYNTHROID) 75 mcg Oral Tablet Take 1 Tab (75 mcg total) by mouth Once a day    lisinopril (PRINIVIL) 40 mg Oral Tablet Take 1 Tab (40 mg total) by mouth Once a day    sotalol (BETAPACE) 80 mg Oral Tablet Take 1 Tab (80 mg total) by mouth Twice daily     Allergies   Allergen Reactions    Bactrim [Sulfamethoxazole-Trimethoprim]     Ciprofloxacin     Digoxin     Plaquenil [Hydroxychloroquine]     Simvastatin      Past Medical History   Diagnosis Date    Hypertension     Thyroid disease     Kidney stones     Atrial fibrillation      Past Surgical History   Procedure Laterality Date    Hx tonsil and adenoidectomy      Hx partial hysterectomy      Hx tubal ligation      Hx wrist fracture tx Left      about 2004     Hx cyst incision and drainage       about 2000     Family History   Problem Relation Age of Onset    Thyroid Disease Sister     Thyroid Disease Brother     Diabetes Daughter      History     Social History    Marital Status: Married     Spouse Name: N/A     Number of Children: N/A    Years of Education: N/A     Social History Main Topics    Smoking  status: Never Smoker     Smokeless tobacco: Not on file    Alcohol Use: No    Drug Use: No    Sexual Activity: Not on file     Other Topics Concern    Not on file     Social History Narrative    No narrative on file       ROS    Examination  Vitals: BP 138/80   Pulse 63   Resp 18   Ht 1.651 m (5\' 5" )   Wt 81.194 kg (179 lb)   BMI 29.79 kg/m2   SpO2 99%  Physical Exam  .    Results    Diagnosis and Plan  1. B12 deficiency    2. 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

## 2013-12-31 NOTE — Progress Notes (Deleted)
[place] OFFICE  [place]  Date of Service: 12/31/2013    Chief Complaint:   Chief Complaint   Patient presents with    Skin Problem       History  HPI    .    ROS    Examination  Vitals: BP 138/80   Pulse 63   Resp 18   Ht 1.651 m (5\' 5" )   Wt 81.194 kg (179 lb)   BMI 29.79 kg/m2   SpO2 99%  Physical Exam  .    Results    Diagnosis and Plan  1. B12 deficiency    2. 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

## 2014-02-14 ENCOUNTER — Encounter (INDEPENDENT_AMBULATORY_CARE_PROVIDER_SITE_OTHER): Payer: Self-pay | Admitting: Endocrinology, Diabetes, & Metabolism

## 2014-02-14 ENCOUNTER — Ambulatory Visit (INDEPENDENT_AMBULATORY_CARE_PROVIDER_SITE_OTHER): Payer: Medicare Other | Admitting: Endocrinology, Diabetes, & Metabolism

## 2014-02-14 VITALS — BP 136/84 | HR 79 | Ht 65.0 in | Wt 179.8 lb

## 2014-02-14 DIAGNOSIS — E039 Hypothyroidism, unspecified: Secondary | ICD-10-CM

## 2014-02-14 DIAGNOSIS — I10 Essential (primary) hypertension: Secondary | ICD-10-CM

## 2014-02-14 NOTE — Progress Notes (Signed)
1. Hypertension:   The patient returns after recent hospitalization at [facility] for hypertensive urgency, atrial fibrillation and persistent nephrolithiasis. Since her previous visit, with medication adjustment, she has done considerably better with stable home BP monitoring and no further arrhythmias. Cynthia Barrera continues sotalol 80 mg BID, lisinopril 40 mg Qam and HCTz 25 mg daily. Note that clonidine and amlodipine have been stopped. On this regimen, BP control reviewed through her daily log has essentially returned to normal with only a few, transient exceptions (usually prior to bedtime). She continues follow-up with Dr. Delia Chimes with a pending exercise stress test. Atrial fibrillation diagnosed during a previous admission to [place] for bronchitis has not been a problem and sinus rhythm has been maintained.   Dr. Georg Ruddle office stopped HCTz, Toprol and lisinopril and started Tenoretic 50/25 1/2 tablet Qam, Azor 10/40 mg daily and Aldactone 25 mg 1/2 tab daily but when these were taken there was a hypotensive response therefore she stopped them and returned to [facility] and sotalol with clonidine on a prn basis when BP's are greater than 150/90 mmHg. Blood pressure today was 136/84 mmHg. In addition to her decision to change therapies, another factor was the discontinuation of soft drinks, i.e., diet coke, with a reduced sodium load. .  Home monitoring has been obtained as much as five times daily with the expected variation - the trend however was for higher pressures in the evening than the morning. Scout also continues to use clonidine as a prn medication although I do not feel this is indicated and may contribute to "rebound" effects. The medication has also contributed to dryness of the mucous membranes as well as drowziness. We'll resume lisinopril 40 mg Qam, monitor her BMP and discontinue clonidine as a prn med. Rather than add additional medications to control evening BP elevation, a rearrangement of  timingmay be as helpful. We'll continue Sotalol in the morning, HCTz at noon and lisinopril 40 mg Qpm with a potential "split" dosage.  2. Hypothyroid:   Synthroid was reduced from 100 mcg Qam to 75 mcg Qam based on [place] studies including TSH 0.474 mIU/mL (normal 0.370-4.420) and free thyroxine 2.10 ng/dL (normal 0.75-2.00). This is a considerably greater reduction than I would have recommended if a reduction was indicated at all. Since she has now taken the 75 mcg dosage for 8 weeks - the TSH obtained during the April hospitalization will suffice - TSH was normal at 2.13 uIU/mL. Further confirmation will be considered on return.  3. 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 Plaquenil toxicity:   Ernie notes no significant change in visual problems but feels that there may be some worsening. She is scheduled to return for follow-up at [facility] with her next appointment in September 2013.  5. ? Glucose intolerance, random glucose 129 mg/dl   The study was on a non-fasting sample but raises minor concerns due to associated risk factors. Angelyca will monitor fasting and 2h post-prandial glucoses at home. The glucose log was reported anecdotally and considered "normal" by the patient. We'll continue weekly home monitoring and recording noting that HbA1c was reported as 5.8% and BMP was essentially normal with a random glucose of 89 mg/dl.  6. Osteoporosis:  DEXA scan obtained recently with less than an ideal interpretation but was consistent with osteoporosis with a T score of -2.8 in the left hip area.  She also had follow-up mammography with additional follow-up pending due to "grey" area in the left breast. Dr. Frederic Jericho suggested she discuss "supplements" with me during this visit. Vitamin D levels should be measured but I seriously doubt that calcium  supplement is indicated due to established hyperparathyroidism and a recent history of renal calculi. A repeat serum calcium will be obtained today.  7. Renal calculi, secondary to hyperparathyroidism:  Cynthia Barrera was scheduled to undergo urethral dilatation as well as lithotripsy with antiplatelet therapy to be discussed with Dr. Georg Ruddle office.  8. Vitamin B12 deficiency:  Cynthia Barrera was recently started on oral B12 therapy after an elevated MMA level was obtained - improvement was evident on a repeat study therefore oral replacement will continue.  Comment:  Over the years we have demonstrated numerous circumstances of what can only be described as "white coat hypertension". The blood pressure elevation is transient and situational with resolution within 15-30 minutes. I am also concerned about the PRN use of clonidine particularly in the evening hours - we may eventually do better with a BID schedule.

## 2014-03-14 ENCOUNTER — Encounter (INDEPENDENT_AMBULATORY_CARE_PROVIDER_SITE_OTHER): Payer: Self-pay | Admitting: Family

## 2014-03-14 ENCOUNTER — Ambulatory Visit (INDEPENDENT_AMBULATORY_CARE_PROVIDER_SITE_OTHER): Payer: Medicare Other | Admitting: Family

## 2014-03-14 VITALS — BP 138/82 | HR 76 | Ht 65.0 in | Wt 180.6 lb

## 2014-03-14 DIAGNOSIS — R5383 Other fatigue: Secondary | ICD-10-CM

## 2014-03-14 DIAGNOSIS — R5381 Other malaise: Secondary | ICD-10-CM

## 2014-03-14 DIAGNOSIS — D509 Iron deficiency anemia, unspecified: Secondary | ICD-10-CM

## 2014-03-14 NOTE — Patient Instructions (Signed)
Go get labs ordered by Dr. Delia Chimes.   Will review tomorrow.  Return to office as needed.   Report to the Emergency Room with worsening symptoms.  Will consider sleep aid after discussing with Dr. Candiss Norse.

## 2014-03-14 NOTE — Progress Notes (Signed)
[place]  INTERNAL MEDICINE SPECIALTY [facility]  [facility] MEDICINE AND SPECIALTY OFFICE  [address] [place]  [address] [place] [address]  [phone]        Patient name:  Cynthia Barrera  MRN:  [id number]  DOB:  06/02/1941  DATE:  03/14/2014        Jeannetta Ellis  Date of Service: 03/14/2014    Chief Complaint:   Chief Complaint   Patient presents with    Fatigue       History  HPI  Patient has had been tired for about 2 months.  She went to Dr. Georg Ruddle office.  They went over stress test results. He said they said they were ok. She has brought her lab work with her from Dr. Delia Chimes.  He has ordered another CBC and TIBC, Ferritin, and Iron.  Result to Dr. Candiss Norse.  Patient states she is also not sleeping well.  She has started taking Ferrous Sulfate 325mg  po daily OTC, 3 days ago.  She reports her daughter has been diagnosed with breast cancer and is having a breast removed next week and she is anxious about that. Denies, fevers, unintentional weight loss, night sweats, bloody or tarry stools.  Last colonoscopy 2011.     Marland Kitchen    Review of Systems   Constitutional: Negative for fever and chills.   HENT: Negative for congestion.    Cardiovascular: Negative for chest pain and leg swelling.   Gastrointestinal: Negative for nausea, vomiting, diarrhea and blood in stool.   Neurological: Negative for dizziness and headaches.       Examination  Vitals: BP 138/82   Pulse 76   Ht 1.651 m (5\' 5" )   Wt 81.92 kg (180 lb 9.6 oz)   BMI 30.05 kg/m2  Physical Exam   Constitutional: She is oriented to person, place, and time. She appears well-developed and well-nourished.   HENT:   Head: Normocephalic.   Right Ear: External ear normal.   Left Ear: External ear normal.   Eyes: Conjunctivae are normal. Pupils are equal, round, and reactive to light.   Neck: Normal range of motion. Neck supple. No thyromegaly present.   Cardiovascular: Normal rate, regular rhythm and normal heart  sounds.    Pulm:  Effort normal and breath sounds normal.  Observations:  no respiratory distress.    There are no wheezes present.    Abdomen:   Bowel sounds are normal. She exhibits no distension. Soft. No tenderness.   Ortho/Musculoskeletal:   Normal range of motion.   Lymphadenopathy:     She has no cervical adenopathy.   Neurological: She is alert and oriented to person, place, and time.   Skin: Skin is warm and dry.   Psychiatric: She has a normal mood and affect.   Vitals reviewed.    .    Results    Diagnosis and Plan  1. Microcytic anemia    2. Fatigue    Go get labs ordered by Dr. Delia Chimes.   Will review tomorrow.  Return to office as needed.   Report to the Emergency Room with worsening symptoms.  Will consider sleep aid after discussing with Dr. Candiss Norse.        Helen Hashimoto, APRN

## 2014-03-16 ENCOUNTER — Telehealth (INDEPENDENT_AMBULATORY_CARE_PROVIDER_SITE_OTHER): Payer: Self-pay | Admitting: Internal Medicine

## 2014-03-16 DIAGNOSIS — F419 Anxiety disorder, unspecified: Secondary | ICD-10-CM

## 2014-03-16 DIAGNOSIS — R739 Hyperglycemia, unspecified: Secondary | ICD-10-CM

## 2014-03-16 DIAGNOSIS — R5383 Other fatigue: Secondary | ICD-10-CM

## 2014-03-16 DIAGNOSIS — K759 Inflammatory liver disease, unspecified: Secondary | ICD-10-CM

## 2014-03-16 MED ORDER — HYDROXYZINE HCL 25 MG TABLET
25.0000 mg | ORAL_TABLET | Freq: Three times a day (TID) | ORAL | Status: DC | PRN
Start: 2014-03-16 — End: 2014-07-13

## 2014-03-16 NOTE — Telephone Encounter (Signed)
Pt with lab work done on 03/14/14 with iron level increased to 395 tibc 456 and %SAT 87. AND H/H 9.5/30.1 with mcv 78 rdw 17.2 but transferrin 318 pt with normal indicis in 2014.  Pt with hx of iron def 30+ yrs ago when menstruating but not post menopausal.      Many of her meds have been changed in the past yrs due to the Afib i particular sotolol and eloquest     At this point will check labs for liver and hemplysis for surces of the above. If normal then will need heme ref else treatment as indicated.  Pt agreeabole to have lab work done    Pt also recently found out family member with breast ca and increased anxiety and request something for her nerves. Will order atarax.

## 2014-03-21 LAB — COMPREHENSIVE METABOLIC PANEL, NON-FASTING
ALBUMIN: 3.3 gm/dL — ABNORMAL LOW (ref 3.4–5.0)
ALKALINE PHOSPHATASE: 89 U/L (ref 45–117)
ALT (SGPT): 18 U/L (ref 13–56)
AST (SGOT): 12 U/L — ABNORMAL LOW (ref 15–37)
BILIRUBIN, TOTAL: 0.3 mg/dL (ref 0.2–1.0)
BUN: 25 mg/dL — ABNORMAL HIGH (ref 7–18)
CALCIUM: 10.2 mg/dL — ABNORMAL HIGH (ref 8.5–10.1)
CARBON DIOXIDE: 27 mmol/L (ref 21–32)
CHLORIDE: 105 mmol/L (ref 98–107)
CREATININE: 1.1 mg/dL (ref 0.6–1.3)
CREATININE: 1.1 mg/dL (ref 0.6–1.3)
ESTIMATED GLOMERULAR FILTRATION RATE: 49 mL/min (ref >60)
GLUCOSE,NONFAST: 119 mg/dL — ABNORMAL HIGH (ref 74–106)
POTASSIUM: 3.9 mmol/L (ref 3.5–5.1)
SODIUM: 140 mmol/L (ref 136–145)
TOTAL PROTEIN: 7.1 g/dL (ref 6.4–8.2)

## 2014-03-21 LAB — HAPTOGLOBIN, SERUM: HAPTOGLOBIN: 182

## 2014-03-21 LAB — HGA1C (HEMOGLOBIN A1C WITH EST AVG GLUCOSE)
HEMOGLOBIN A1C: 5.8 %
HEMOGLOBIN A1C: 5.8 %

## 2014-03-21 LAB — GAMMA GT: GAMMA GT: 19 U/L (ref 5–55)

## 2014-03-21 LAB — LDH: LDH: 190 U/L (ref 84–246)

## 2014-03-21 LAB — FERRITIN: FERRITIN: 13.1 ng/mL (ref 8.0–252.0)

## 2014-03-22 ENCOUNTER — Telehealth (INDEPENDENT_AMBULATORY_CARE_PROVIDER_SITE_OTHER): Payer: Self-pay | Admitting: Internal Medicine

## 2014-03-22 DIAGNOSIS — D649 Anemia, unspecified: Secondary | ICD-10-CM

## 2014-03-22 NOTE — Telephone Encounter (Signed)
Labs reveal ferritin low normal at 13 haptoglobin normal and ldh normal not c/w hemolysis.  With ferritin low normal not c/w iron overload. Rect count at 24 %    At this point will recheck iron level and tibc and % and refer to hemitologist since above values not c/w iron overload.    Pt not home left message of labs to do and refer to heme doc.

## 2014-03-23 ENCOUNTER — Telehealth (INDEPENDENT_AMBULATORY_CARE_PROVIDER_SITE_OTHER): Payer: Self-pay | Admitting: Internal Medicine

## 2014-03-23 LAB — IRON STUDIES
IRON BINDING CAPACITY: 418 ug/dL (ref 250–450)
IRON SATURATION: 9 % — ABNORMAL LOW (ref 15–50)
IRON: 39 ug/dL — ABNORMAL LOW (ref 50–170)
TRANSFERRIN: 313 mg/dL (ref 200–360)

## 2014-03-23 NOTE — Telephone Encounter (Signed)
Spoke to pt to relay blood work inconsistet for iron overload, pt willing to repeat iron level test to see if first value was in error. Since ferritin low normal at 13 range.

## 2014-03-24 ENCOUNTER — Telehealth (INDEPENDENT_AMBULATORY_CARE_PROVIDER_SITE_OTHER): Payer: Self-pay | Admitting: Internal Medicine

## 2014-03-24 DIAGNOSIS — D509 Iron deficiency anemia, unspecified: Secondary | ICD-10-CM

## 2014-03-24 MED ORDER — POLYSACCHARIDE IRON COMPLEX 150 MG IRON CAPSULE
150.0000 mg | ORAL_CAPSULE | Freq: Every day | ORAL | Status: DC
Start: 2014-03-24 — End: 2014-10-04

## 2014-03-24 NOTE — Telephone Encounter (Signed)
Repeat iron studies return consistent with iron def.  Pt does have a hx of getting constipated so d/c iron sulfate and changed to iron polysac.150 daily. Will recheck ferritin and cbc in 6 wks to ensure improving.  Pt agreeable to this.  Will also cancel heme consult since repeat lab work all consistent.

## 2014-05-16 ENCOUNTER — Other Ambulatory Visit (INDEPENDENT_AMBULATORY_CARE_PROVIDER_SITE_OTHER): Payer: Self-pay | Admitting: Endocrinology, Diabetes, & Metabolism

## 2014-05-16 DIAGNOSIS — I10 Essential (primary) hypertension: Secondary | ICD-10-CM

## 2014-05-16 DIAGNOSIS — E039 Hypothyroidism, unspecified: Secondary | ICD-10-CM

## 2014-05-16 MED ORDER — LEVOTHYROXINE 75 MCG TABLET
75.0000 ug | ORAL_TABLET | Freq: Every day | ORAL | Status: DC
Start: 2014-05-16 — End: 2015-06-16

## 2014-05-16 MED ORDER — CLONIDINE HCL 0.1 MG TABLET
0.1000 mg | ORAL_TABLET | Freq: Two times a day (BID) | ORAL | Status: DC
Start: 2014-05-16 — End: 2015-07-17

## 2014-05-18 ENCOUNTER — Encounter (INDEPENDENT_AMBULATORY_CARE_PROVIDER_SITE_OTHER): Payer: Medicare Other | Admitting: Endocrinology, Diabetes, & Metabolism

## 2014-05-18 ENCOUNTER — Encounter (INDEPENDENT_AMBULATORY_CARE_PROVIDER_SITE_OTHER): Payer: Self-pay | Admitting: Endocrinology, Diabetes, & Metabolism

## 2014-05-18 ENCOUNTER — Ambulatory Visit (INDEPENDENT_AMBULATORY_CARE_PROVIDER_SITE_OTHER): Payer: Medicare Other | Admitting: Endocrinology, Diabetes, & Metabolism

## 2014-05-18 VITALS — BP 132/76 | Ht 65.0 in | Wt 184.8 lb

## 2014-05-18 DIAGNOSIS — I10 Essential (primary) hypertension: Secondary | ICD-10-CM

## 2014-05-18 NOTE — Progress Notes (Signed)
1. Hypertension:   Since her previous visit, with medication adjustment, she has done considerably better with stable home BP monitoring and no further arrhythmias. Cynthia Barrera continues sotalol 80 mg BID, lisinopril 40 mg Qam and HCTz 25 mg daily. Note that clonidine and amlodipine have been stopped. On this regimen, BP control reviewed through her daily log has essentially returned to normal with only a few, transient exceptions (usually prior to bedtime). She continues follow-up with Dr. Delia Chimes with a pending exercise stress test. Atrial fibrillation diagnosed during a previous admission to [facility] for bronchitis has not been a problem and sinus rhythm has been maintained.   Dr. Georg Ruddle office stopped HCTz, Toprol and lisinopril and started Tenoretic 50/25 1/2 tablet Qam, Azor 10/40 mg daily and Aldactone 25 mg 1/2 tab daily but when these were taken there was a hypotensive response therefore she stopped them and returned to [facility] and sotalol with clonidine on a prn basis when BP's are greater than 150/90 mmHg. Blood pressure today was 136/84 mmHg. In addition to her decision to change therapies, another factor was the discontinuation of soft drinks, i.e., diet coke, with a reduced sodium load. .  Home monitoring has been obtained as much as five times daily with the expected variation - the trend however was for higher pressures in the evening than the morning. Cynthia Barrera also continues to use clonidine as a prn medication although I do not feel this is indicated and may contribute to "rebound" effects. The medication has also contributed to dryness of the mucous membranes as well as drowziness. We'll resume lisinopril 40 mg Qam, monitor her BMP and discontinue clonidine as a prn med. Rather than add additional medications to control evening BP elevation, a rearrangement of timing may be as helpful. We'll continue Sotalol in the morning, HCTz at noon and lisinopril 40 mg Qpm with a potential "split" dosage.  2.  Hypothyroid:   Synthroid was reduced from 100 mcg Qam to 75 mcg Qam based on [place] studies including TSH 0.474 mIU/mL (normal 0.370-4.420) and free thyroxine 2.10 ng/dL (normal 0.75-2.00). This is a considerably greater reduction than I would have recommended if a reduction was indicated at all. Since she has now taken the 75 mcg dosage for 8 weeks - the TSH obtained during the April hospitalization will suffice - TSH was normal at 2.13 uIU/mL. Further confirmation will be considered on return.  Thyroid examination was performed since Dr. Frederic Jericho felt there was a "change" - on exam, the prominence of both lobes was again noted with nodularity over the isthmic region appearing unchanged from previous exams. Thyroid ultrasound will be considered.  3. 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 Plaquenil toxicity:   Cynthia Barrera notes no significant change in visual problems but feels that there may be some worsening. She is scheduled to return for follow-up at [facility] with her next appointment in September 2013.  5. ? Glucose intolerance, random glucose 129 mg/dl   The study was on a non-fasting sample but raises minor concerns due to associated risk factors. Cynthia Barrera will monitor fasting and 2h post-prandial glucoses at home. The glucose log was reported anecdotally and considered "normal" by the patient. We'll continue weekly home monitoring and recording noting that HbA1c was reported as 5.8% and BMP was essentially normal with a random glucose of 89 mg/dl.  6. Osteoporosis:  DEXA  scan obtained recently with less than an ideal interpretation but was consistent with osteoporosis with a T score of -2.8 in the left hip area. She also had follow-up mammography with additional follow-up pending due to "grey" area in the left breast. Dr. Frederic Jericho suggested she  discuss "supplements" with me during this visit. Vitamin D levels should be measured but I seriously doubt that calcium supplement is indicated due to established hyperparathyroidism and a recent history of renal calculi. A repeat serum calcium will be obtained today.  7. Renal calculi, secondary to hyperparathyroidism:  Cynthia Barrera was scheduled to undergo urethral dilatation as well as lithotripsy with antiplatelet therapy to be discussed with Dr. Georg Ruddle office.  8. Vitamin B12 deficiency:  Cynthia Barrera was recently started on oral B12 therapy after an elevated MMA level was obtained - improvement was evident on a repeat study therefore oral replacement will continue.  Comment:  Over the years we have demonstrated numerous circumstances of what can only be described as "white coat hypertension". The blood pressure elevation is transient and situational with resolution within 15-30 minutes. An example of this was evident today with BP recorded 132/76 mmHg with Cynthia Barrera still "groggy" from anaesthesia for a cystoscopy this morning. I am also concerned about the PRN use of clonidine particularly in the evening hours - we may eventually do better with a BID schedule.

## 2014-07-06 ENCOUNTER — Encounter (INDEPENDENT_AMBULATORY_CARE_PROVIDER_SITE_OTHER): Payer: Medicare Other | Admitting: Internal Medicine

## 2014-07-13 ENCOUNTER — Ambulatory Visit (INDEPENDENT_AMBULATORY_CARE_PROVIDER_SITE_OTHER): Payer: Medicare Other | Admitting: Internal Medicine

## 2014-07-13 ENCOUNTER — Encounter (INDEPENDENT_AMBULATORY_CARE_PROVIDER_SITE_OTHER): Payer: Self-pay | Admitting: Internal Medicine

## 2014-07-13 VITALS — BP 124/76 | HR 66 | Resp 20 | Ht 64.5 in | Wt 186.0 lb

## 2014-07-13 DIAGNOSIS — F411 Generalized anxiety disorder: Secondary | ICD-10-CM

## 2014-07-13 DIAGNOSIS — R5381 Other malaise: Secondary | ICD-10-CM

## 2014-07-13 DIAGNOSIS — G4733 Obstructive sleep apnea (adult) (pediatric): Secondary | ICD-10-CM

## 2014-07-13 DIAGNOSIS — R5383 Other fatigue: Secondary | ICD-10-CM

## 2014-07-13 DIAGNOSIS — F419 Anxiety disorder, unspecified: Secondary | ICD-10-CM

## 2014-07-13 MED ORDER — HYDROXYZINE HCL 25 MG TABLET
25.0000 mg | ORAL_TABLET | Freq: Three times a day (TID) | ORAL | Status: DC | PRN
Start: 2014-07-13 — End: 2015-03-15

## 2014-07-13 MED ORDER — DULOXETINE 30 MG CAPSULE,DELAYED RELEASE
30.0000 mg | DELAYED_RELEASE_CAPSULE | Freq: Every day | ORAL | Status: DC
Start: 2014-07-13 — End: 2014-10-10

## 2014-07-13 NOTE — Patient Instructions (Signed)
Add fiber to diet such as Fibercon or metamucil to help with cramping with bowel movements.

## 2014-07-13 NOTE — Progress Notes (Signed)
[place]  Date of Service: 07/13/2014    Chief Complaint:   Chief Complaint   Patient presents with    Fatigue       History  HPI    Pt found to be iron def has been taking iron pills and tol them.    Since last visit saw urologist had two cystoscopes done both were neg for stones .  First cysto had uriatal dilatation.    Still feels tired all the time. Does have increase stressor since her daughter was dx with breast ca.    Note pt with sleep study positive for osa but could not tol mask for titration study.  Has been taking atarax at night time and feels as if she sleeps better when she takes it.  However still feels fatigue throughout the day.        Current Outpatient Prescriptions   Medication Sig    amLODIPine (NORVASC) 5 mg Oral Tablet Take 5 mg by mouth Once a day    apixaban (ELIQUIS) 5 mg Oral Tablet Take 5 mg by mouth Twice daily    aspirin (ECOTRIN) 81 mg Oral Tablet, Delayed Release (E.C.) Take 81 mg by mouth Once a day    cloNIDine HCl (CATAPRES) 0.1 mg Oral Tablet Take 1 Tab (0.1 mg total) by mouth Twice daily    DULoxetine (CYMBALTA DR) 30 mg Oral Capsule, Delayed Release(E.C.) Take 1 Cap (30 mg total) by mouth Once a day    hydrochlorothiazide (HYDRODIURIL) 25 mg Oral Tablet Take 1 Tab (25 mg total) by mouth Once a day    hydrOXYzine HCl (ATARAX) 25 mg Oral Tablet Take 1 Tab (25 mg total) by mouth Three times a day as needed for Itching or Anxiety    levothyroxine (SYNTHROID) 75 mcg Oral Tablet Take 1 Tab (75 mcg total) by mouth Once a day    lisinopril (PRINIVIL) 40 mg Oral Tablet Take 1 Tab (40 mg total) by mouth Once a day    polysaccharide iron complex (FERREX 150) 150 mg iron Oral Capsule Take 1 Cap (150 mg total) by mouth Once a day    sotalol (BETAPACE) 80 mg Oral Tablet Take 1 Tab (80 mg total) by mouth Twice daily       Review of Systems    Examination  Vitals: BP 124/76 mmHg   Pulse 66   Resp 20   Ht 1.638 m (5'  4.5")   Wt 84.369 kg (186 lb)   BMI 31.45 kg/m2   SpO2 93%  Physical Exam   Constitutional: She appears well-nourished.   HENT:   Head: Normocephalic and atraumatic.   Neck: Neck supple.   Cardiovascular: Normal rate, regular rhythm and normal heart sounds.    Pulmonary/Chest: Effort normal and breath sounds normal. She has no wheezes.   Abdominal: Soft. Bowel sounds are normal.   Musculoskeletal: She exhibits no edema.   Vitals reviewed.    Ortho Exam    Results  1/15 mma 0.31      4/15 ldh 190  retic 2.4  Fer 13.1  Lab Results   Component Value Date/Time    IRONBINDCAP 418 03/23/2014 02:25 PM    IRONSAT 9* 03/23/2014 02:25 PM   iron 39 trans [id number]    Basic Metabolic Profile    Lab Results   Component Value Date/Time    SODIUM 140 03/21/2014 09:50 AM    POTASSIUM 3.9 03/21/2014 09:50  AM    CHLORIDE 105 03/21/2014 09:50 AM    CO2 27 03/21/2014 09:50 AM    Lab Results   Component Value Date/Time    BUN 25* 03/21/2014 09:50 AM    CREATININE 1.1 03/21/2014 09:50 AM    GLUCOSENF 119* 03/21/2014 09:50 AM            CBC  Diff   No results found for: WBC, WBCJ, HGB, HCT, PLTCNT, SEDRATE, RBC, MCV, MCHC, MCH, RDW, MPV No results found for: PMNS, LYMPHOCYTES, EOSINOPHIL, MONOCYTES, BASOPHILS, PMNABS, LYMPHSABS, EOSABS, MONOSABS, BASOSABS         6/15 cbc wbc 8.1 h/h 10/ 31.5 plt 284 mcv 80 rdw 19.9  Which is improved from 4/15    4/15 cbc h/h 9.5/30 mcv 78  Diagnosis and Plan  1. Anxiety    2. Fatigue    1. Anxiety  Atarax is helping with anxiety sym but add Cymbalta to see if can tx underlie source.  - hydrOXYzine HCl (ATARAX) 25 mg Oral Tablet; Take 1 Tab (25 mg total) by mouth Three times a day as needed for Itching or Anxiety  Dispense: 60 Tab; Refill: 2  - DULoxetine (CYMBALTA DR) 30 mg Oral Capsule, Delayed Release(E.C.); Take 1 Cap (30 mg total) by mouth Once a day  Dispense: 30 Cap; Refill: 3    2. Fatigue  With current tx cbc improving as iron deficiently improving hopefully this will help with fatigue sym.  But doubt  will be fully resolved due to unable to tx mild osa .  - hydrOXYzine HCl (ATARAX) 25 mg Oral Tablet; Take 1 Tab (25 mg total) by mouth Three times a day as needed for Itching or Anxiety  Dispense: 60 Tab; Refill: 2  - DULoxetine (CYMBALTA DR) 30 mg Oral Capsule, Delayed Release(E.C.); Take 1 Cap (30 mg total) by mouth Once a day  Dispense: 30 Cap; Refill: 3    3. OSA (obstructive sleep apnea)  Not tol CPAP trial.  Avoid sleep meds do to not want to make osa worse. Using atarax currently would like to have d/c this hopefully post Cymbalta will be able to wean this off.   Orders Placed This Encounter    hydrOXYzine HCl (ATARAX) 25 mg Oral Tablet    DULoxetine (CYMBALTA DR) 30 mg Oral Capsule, Delayed Release(E.C.)       Darleen Crocker, DO

## 2014-08-24 ENCOUNTER — Encounter (INDEPENDENT_AMBULATORY_CARE_PROVIDER_SITE_OTHER): Payer: Medicare Other | Admitting: Endocrinology, Diabetes, & Metabolism

## 2014-09-04 ENCOUNTER — Encounter (INDEPENDENT_AMBULATORY_CARE_PROVIDER_SITE_OTHER): Payer: Self-pay | Admitting: Endocrinology, Diabetes, & Metabolism

## 2014-09-04 ENCOUNTER — Ambulatory Visit (INDEPENDENT_AMBULATORY_CARE_PROVIDER_SITE_OTHER): Payer: Medicare Other | Admitting: Endocrinology, Diabetes, & Metabolism

## 2014-09-04 VITALS — BP 124/72 | HR 64 | Ht 64.5 in | Wt 187.2 lb

## 2014-09-04 DIAGNOSIS — I10 Essential (primary) hypertension: Principal | ICD-10-CM

## 2014-09-04 NOTE — Progress Notes (Signed)
1. Hypertension, BP 124/72 mmHg :   Since her previous visit, with medication adjustment, she has done considerably better with stable home BP monitoring and no further arrhythmias. Cynthia Barrera continues sotalol 80 mg BID, lisinopril 40 mg Qam and HCTz 25 mg daily. Note that clonidine and amlodipine have been stopped. On this regimen, BP control reviewed through her daily log has essentially returned to normal with only a few, transient exceptions (usually prior to bedtime). She continues follow-up with Dr. Delia Chimes with a pending exercise stress test. Atrial fibrillation diagnosed during a previous admission to [place] for bronchitis has not been a problem and sinus rhythm has been maintained. Her current maintenance on Eliquis and ASA remains.  Dr. Georg Ruddle office stopped HCTz, Toprol and lisinopril and started Tenoretic 50/25 1/2 tablet Qam, Azor 10/40 mg daily and Aldactone 25 mg 1/2 tab daily but when these were taken there was a hypotensive response therefore she stopped them and returned to [facility] and sotalol with clonidine on a prn basis when BP's are greater than 150/90 mmHg. Blood pressure today was 136/84 mmHg. In addition to her decision to change therapies, another factor was the discontinuation of soft drinks, i.e., diet coke, with a reduced sodium load. .  Home monitoring has been obtained as much as five times daily with the expected variation - the trend however was for higher pressures in the evening than the morning. Cynthia Barrera also continues to use clonidine as a prn medication although I do not feel this is indicated and may contribute to "rebound" effects. The medication has also contributed to dryness of the mucous membranes as well as drowziness. We'll resume lisinopril 40 mg Qam, monitor her BMP and discontinue clonidine as a prn med. Rather than add additional medications to control evening BP elevation, a rearrangement of timing may be as helpful. We'll continue Sotalol in the morning, HCTz  at noon and lisinopril 40 mg Qpm with a potential "split" dosage.  2. Hypothyroid:   Synthroid was reduced from 100 mcg Qam to 75 mcg Qam based on [place] studies including TSH 0.474 mIU/mL (normal 0.370-4.420) and free thyroxine 2.10 ng/dL (normal 0.75-2.00). This is a considerably greater reduction than I would have recommended if a reduction was indicated at all. Since she has now taken the 75 mcg dosage for 8 weeks - the TSH obtained during the April hospitalization will suffice - TSH was normal at 2.13 uIU/mL. Further confirmation will be considered on return.  Thyroid examination was performed since Dr. Frederic Jericho felt there was a "change" - on exam, the prominence of both lobes was again noted with nodularity over the isthmic region appearing unchanged from previous exams. Thyroid ultrasound will be considered.  3. 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 Plaquenil toxicity:   Cynthia Barrera notes no significant change in visual problems but feels that there may be some worsening. She is scheduled to return for follow-up at [facility] with her next appointment in October 2015 with follow-up pending.  5. ? Glucose intolerance, random glucose 129 mg/dl   The study was on a non-fasting sample but raises minor concerns due to associated risk factors. Cynthia Barrera will monitor fasting and 2h post-prandial glucoses at home. The glucose log was reported anecdotally and considered "normal" by the patient. We'll continue weekly home monitoring and recording noting that HbA1c was reported as 5.8% and BMP was  essentially normal with a random glucose of 89 mg/dl.  6. Osteoporosis:  DEXA scan obtained recently with less than an ideal interpretation but was consistent with osteoporosis with a T score of -2.8 in the left hip area. She also had follow-up mammography with  additional follow-up pending due to "grey" area in the left breast. Dr. Frederic Jericho suggested she discuss "supplements" with me during this visit. Vitamin D levels should be measured but I seriously doubt that calcium supplement is indicated due to established hyperparathyroidism and a recent history of renal calculi. A repeat serum calcium will be obtained today.  7. Renal calculi, secondary to hyperparathyroidism:  Cynthia Barrera was scheduled to undergo urethral dilatation as well as lithotripsy with antiplatelet therapy to be discussed with Dr. Georg Ruddle office.  8. Vitamin B12 deficiency:  Cynthia Barrera was recently started on oral B12 therapy after an elevated MMA level was obtained - improvement was evident on a repeat study therefore oral replacement will continue.  Comment:  Cynthia Barrera's daughter recently was diagnosed with breast cancer and has gone through chemotherapy to date with a good response to date. This has been another source of "stress" but appears to be handled well.  Over the years we have demonstrated numerous circumstances of what can only be described as "white coat hypertension". The blood pressure elevation is transient and situational with resolution within 15-30 minutes. An example of this was seen previously with BP recorded 132/76 mmHg with Cynthia Barrera still "groggy" from anaesthesia for a cystoscopy this morning. I am also concerned about the PRN use of clonidine particularly in the evening hours - we may eventually do better with a BID schedule or not at all.

## 2014-10-04 ENCOUNTER — Other Ambulatory Visit (INDEPENDENT_AMBULATORY_CARE_PROVIDER_SITE_OTHER): Payer: Self-pay | Admitting: Internal Medicine

## 2014-10-10 ENCOUNTER — Ambulatory Visit (INDEPENDENT_AMBULATORY_CARE_PROVIDER_SITE_OTHER): Payer: Medicare Other | Admitting: Internal Medicine

## 2014-10-10 VITALS — BP 150/82 | HR 70 | Resp 20 | Ht 65.0 in | Wt 189.6 lb

## 2014-10-10 DIAGNOSIS — Z0000 Encounter for general adult medical examination without abnormal findings: Secondary | ICD-10-CM

## 2014-10-10 DIAGNOSIS — R0982 Postnasal drip: Secondary | ICD-10-CM

## 2014-10-10 DIAGNOSIS — D509 Iron deficiency anemia, unspecified: Secondary | ICD-10-CM

## 2014-10-10 DIAGNOSIS — E039 Hypothyroidism, unspecified: Secondary | ICD-10-CM

## 2014-10-10 DIAGNOSIS — I10 Essential (primary) hypertension: Secondary | ICD-10-CM

## 2014-10-10 DIAGNOSIS — F419 Anxiety disorder, unspecified: Secondary | ICD-10-CM

## 2014-10-10 DIAGNOSIS — R600 Localized edema: Secondary | ICD-10-CM | POA: Insufficient documentation

## 2014-10-10 DIAGNOSIS — E213 Hyperparathyroidism, unspecified: Secondary | ICD-10-CM

## 2014-10-10 DIAGNOSIS — E785 Hyperlipidemia, unspecified: Secondary | ICD-10-CM

## 2014-10-10 DIAGNOSIS — R5383 Other fatigue: Secondary | ICD-10-CM

## 2014-10-10 DIAGNOSIS — Z23 Encounter for immunization: Secondary | ICD-10-CM

## 2014-10-10 MED ORDER — LISINOPRIL 40 MG TABLET
40.0000 mg | ORAL_TABLET | Freq: Every day | ORAL | Status: DC
Start: 2014-10-10 — End: 2015-12-01

## 2014-10-10 MED ORDER — HYDROCHLOROTHIAZIDE 25 MG TABLET
25.0000 mg | ORAL_TABLET | Freq: Every day | ORAL | Status: DC
Start: 2014-10-10 — End: 2016-01-07

## 2014-10-10 MED ORDER — FLUTICASONE PROPIONATE 50 MCG/ACTUATION NASAL SPRAY,SUSPENSION
2.0000 | Freq: Every day | NASAL | Status: DC
Start: 2014-10-10 — End: 2016-03-27

## 2014-10-10 MED ORDER — DULOXETINE 30 MG CAPSULE,DELAYED RELEASE
60.0000 mg | DELAYED_RELEASE_CAPSULE | Freq: Every day | ORAL | Status: DC
Start: 2014-10-10 — End: 2015-03-15

## 2014-10-10 MED ORDER — POLYSACCHARIDE IRON COMPLEX 150 MG IRON CAPSULE
ORAL_CAPSULE | ORAL | Status: DC
Start: 2014-10-10 — End: 2014-12-07

## 2014-10-10 NOTE — Progress Notes (Signed)
[facility]  Date of Service: 10/10/2014    Chief Complaint:   Chief Complaint   Patient presents with    Anxiety       History  HPI  2 episode of Afib one since the last visit and one episode about 1.5 yrs ago both night time while asleep.  lasted for 1 hr then resolved.     Is taking the Cymbalta 30 but taking at bed time and thinks is helping with anxiety.      Still fatigue when awake but not tol mask for osa    Has been having some post nasal dripping past 1 month      Current Outpatient Prescriptions   Medication Sig    amLODIPine (NORVASC) 5 mg Oral Tablet Take 5 mg by mouth Once a day    apixaban (ELIQUIS) 5 mg Oral Tablet Take 5 mg by mouth Twice daily    aspirin (ECOTRIN) 81 mg Oral Tablet, Delayed Release (E.C.) Take 81 mg by mouth Once a day    cloNIDine HCl (CATAPRES) 0.1 mg Oral Tablet Take 1 Tab (0.1 mg total) by mouth Twice daily    DULoxetine (CYMBALTA DR) 30 mg Oral Capsule, Delayed Release(E.C.) Take 2 Caps (60 mg total) by mouth Once a day    fluticasone (FLONASE) 50 mcg/actuation Nasal Spray, Suspension 2 Sprays by Each Nostril route Once a day    hydrochlorothiazide (HYDRODIURIL) 25 mg Oral Tablet Take 1 Tab (25 mg total) by mouth Once a day    hydrOXYzine HCl (ATARAX) 25 mg Oral Tablet Take 1 Tab (25 mg total) by mouth Three times a day as needed for Itching or Anxiety    levothyroxine (SYNTHROID) 75 mcg Oral Tablet Take 1 Tab (75 mcg total) by mouth Once a day    lisinopril (PRINIVIL) 40 mg Oral Tablet Take 1 Tab (40 mg total) by mouth Once a day    polysaccharide iron complex (FERREX 150) 150 mg iron Oral Capsule TAKE ONE CAPSULE BY MOUTH ONCE DAILY    sotalol (BETAPACE) 80 mg Oral Tablet Take 1 Tab (80 mg total) by mouth Twice daily       Review of Systems    Examination  Vitals: BP 150/82 mmHg   Pulse 70   Resp 20   Ht 1.651 m (5\' 5" )   Wt 86.002 kg (189 lb 9.6 oz)   BMI 31.55 kg/m2   SpO2 98%  Physical Exam      Constitutional: She appears well-developed and well-nourished.   HENT:   Head: Normocephalic and atraumatic.   Right Ear: External ear normal.   Left Ear: External ear normal.   Positive clear pnd   Eyes: Pupils are equal, round, and reactive to light.   Neck: Neck supple.   Cardiovascular: Normal rate, regular rhythm and normal heart sounds.    Pulmonary/Chest: Effort normal and breath sounds normal. She has no rales.   Coarse bs on rt mid lung field resolve post cough rest of lungs clear   Musculoskeletal: Edema: 2+ edema bilat legs to knees not bother her has had for many yrs.   Lymphadenopathy:     She has no cervical adenopathy.   Vitals reviewed.    Ortho Exam    Results    Diagnosis and Plan  1. Health care maintenance    2. Immunization due    3. Essential hypertension    4. Anxiety  5. Fatigue    6. PND (post-nasal drip)    7. Bilateral edema of lower extremity    8. Hyperparathyroidism    9. HLD (hyperlipidemia)    10. Iron deficiency anemia    11. Hypothyroid    1. Health care maintenance     - Influenza Vaccine IM Admin    2. Immunization due     - Influenza Vaccine IM Admin    3. Essential hypertension  Continue BP meds BP running slightly high today but pt states home BP lower then today's reading will ask pt to check home BP and let me know values.  - hydrochlorothiazide (HYDRODIURIL) 25 mg Oral Tablet; Take 1 Tab (25 mg total) by mouth Once a day  Dispense: 90 Tab; Refill: 3  - lisinopril (PRINIVIL) 40 mg Oral Tablet; Take 1 Tab (40 mg total) by mouth Once a day  Dispense: 90 Tab; Refill: 3    4. Anxiety  Suspect improved little on this will have pt take the Cymbalta in the morning rather then at bed time and increase the dose to 60 qam if not tol this dose then can decrease to 30 again.  Pt will let me know.    - DULoxetine (CYMBALTA DR) 30 mg Oral Capsule, Delayed Release(E.C.); Take 2 Caps (60 mg total) by mouth Once a day  Dispense: 60 Cap; Refill: 3    5. Fatigue  Hoping Cymbalta will help  with increase energy, but since does have osa and not tol c-pap do not feel this will improve until can decrease wt.      Pt with 2 episodes of Afib t night time while she was sleeping (awoke from sleep) suspect this is do to osa rather then other source  - DULoxetine (CYMBALTA DR) 30 mg Oral Capsule, Delayed Release(E.C.); Take 2 Caps (60 mg total) by mouth Once a day  Dispense: 60 Cap; Refill: 3  - CBC; Future  - COMPREHENSIVE METABOLIC PNL, FASTING; Future  - CBC  - COMPREHENSIVE METABOLIC PNL, FASTING    6. PND (post-nasal drip)     - fluticasone (FLONASE) 50 mcg/actuation Nasal Spray, Suspension; 2 Sprays by Each Nostril route Once a day  Dispense: 1 Bottle; Refill: 11    7. Bilateral edema of lower extremity  Suspect do to venous reflux not CHF not bothering pt so monitor    8. Hyperparathyroidism  Recheck ca level  - COMPREHENSIVE METABOLIC PNL, FASTING; Future  - COMPREHENSIVE METABOLIC PNL, FASTING    9. HLD (hyperlipidemia)  Recheck prior to next visit  - COMPREHENSIVE METABOLIC PNL, FASTING; Future  - LIPID PANEL; Future  - COMPREHENSIVE METABOLIC PNL, FASTING  - LIPID PANEL    10. Iron deficiency anemia  Recheck prior to next visit, tol the iron poly  - polysaccharide iron complex (FERREX 150) 150 mg iron Oral Capsule; TAKE ONE CAPSULE BY MOUTH ONCE DAILY  Dispense: 90 Cap; Refill: 3  - IRON STUDIES; Future  - FERRITIN; Future  - CBC; Future  - IRON STUDIES  - FERRITIN  - CBC    11. Hypothyroid  Recheck prior to next visit. In april  - TSH; Future  - TSH  Orders Placed This Encounter    Influenza Vaccine IM Admin    IRON STUDIES    FERRITIN    CBC    COMPREHENSIVE METABOLIC PNL, FASTING    LIPID PANEL    TSH    polysaccharide iron complex (FERREX 150) 150 mg iron Oral Capsule  hydrochlorothiazide (HYDRODIURIL) 25 mg Oral Tablet    lisinopril (PRINIVIL) 40 mg Oral Tablet    DULoxetine (CYMBALTA DR) 30 mg Oral Capsule, Delayed Release(E.C.)    fluticasone (FLONASE) 50 mcg/actuation Nasal  Spray, Suspension       Toriana Sponsel, DO

## 2014-10-12 ENCOUNTER — Encounter (INDEPENDENT_AMBULATORY_CARE_PROVIDER_SITE_OTHER): Payer: Medicare Other | Admitting: Internal Medicine

## 2014-12-01 DIAGNOSIS — L900 Lichen sclerosus et atrophicus: Secondary | ICD-10-CM

## 2014-12-01 HISTORY — DX: Lichen sclerosus et atrophicus: L90.0

## 2014-12-07 ENCOUNTER — Other Ambulatory Visit (INDEPENDENT_AMBULATORY_CARE_PROVIDER_SITE_OTHER): Payer: Self-pay | Admitting: Endocrinology, Diabetes, & Metabolism

## 2014-12-07 ENCOUNTER — Ambulatory Visit (INDEPENDENT_AMBULATORY_CARE_PROVIDER_SITE_OTHER): Payer: Medicare Other | Admitting: Endocrinology, Diabetes, & Metabolism

## 2014-12-07 ENCOUNTER — Encounter (INDEPENDENT_AMBULATORY_CARE_PROVIDER_SITE_OTHER): Payer: Self-pay | Admitting: Endocrinology, Diabetes, & Metabolism

## 2014-12-07 VITALS — BP 166/82 | HR 74 | Ht 65.0 in | Wt 186.5 lb

## 2014-12-07 DIAGNOSIS — E034 Atrophy of thyroid (acquired): Secondary | ICD-10-CM

## 2014-12-07 DIAGNOSIS — D509 Iron deficiency anemia, unspecified: Secondary | ICD-10-CM

## 2014-12-07 DIAGNOSIS — I10 Essential (primary) hypertension: Secondary | ICD-10-CM

## 2014-12-07 DIAGNOSIS — E038 Other specified hypothyroidism: Secondary | ICD-10-CM

## 2014-12-07 NOTE — Progress Notes (Signed)
1. Hypertension, BP 166/82 mmHg (medications only taken 30-45 minutes age):   Since her previous visit, with medication adjustment, she has done considerably better with stable home BP monitoring and no further arrhythmias. Cynthia Barrera continues sotalol 80 mg BID, lisinopril 40 mg Qam and HCTz 25 mg daily. Note that clonidine and amlodipine have been stopped. On this regimen, BP control reviewed through her daily log has essentially returned to normal with only a few, transient exceptions (usually prior to bedtime). She continues follow-up with Dr. Delia Chimes with a pending exercise stress test. Atrial fibrillation diagnosed during a previous admission to [place] [facility] for bronchitis has not been a problem and sinus rhythm has been maintained. Her current maintenance on Eliquis and ASA remains.  Dr. Georg Ruddle office stopped HCTz, Toprol and lisinopril and started Tenoretic 50/25 1/2 tablet Qam, Azor 10/40 mg daily and Aldactone 25 mg 1/2 tab daily but when these were taken there was a hypotensive response therefore she stopped them and returned to [facility] and sotalol with clonidine on a prn basis when BP's are greater than 150/90 mmHg. Blood pressure today was 136/84 mmHg. In addition to her decision to change therapies, another factor was the discontinuation of soft drinks, i.e., diet coke, with a reduced sodium load. .  Home monitoring has been obtained as much as five times daily with the expected variation - the trend however was for higher pressures in the evening than the morning. Cynthia Barrera also continues to use clonidine as a prn medication although I do not feel this is indicated and may contribute to "rebound" effects. The medication has also contributed to dryness of the mucous membranes as well as drowziness. We'll resume lisinopril 40 mg Qam, monitor her BMP and discontinue clonidine as a prn med. Rather than add additional medications to control evening BP elevation, a rearrangement of timing may be as helpful.  We'll continue Sotalol in the morning, HCTz at noon and lisinopril 40 mg Qpm with a potential "split" dosage.  2. Hypothyroid:   Synthroid was reduced from 100 mcg Qam to 75 mcg Qam based on [place] studies including TSH 0.474 mIU/mL (normal 0.370-4.420) and free thyroxine 2.10 ng/dL (normal 0.75-2.00). This is a considerably greater reduction than I would have recommended if a reduction was indicated at all. Since she has now taken the 75 mcg dosage for 8 weeks - the TSH obtained during the April hospitalization will suffice - TSH was normal at 2.13 uIU/mL. Further confirmation will be considered on return.  Thyroid examination was performed since Dr. Frederic Jericho felt there was a "change" - on exam, the prominence of both lobes was again noted with nodularity over the isthmic region appearing unchanged from previous exams - findings which were again confirmed. Thyroid ultrasound will be considered.  3. 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 Plaquenil toxicity:   Cynthia Barrera notes no significant change in visual problems but feels that there may be some worsening. She is scheduled to return for follow-up at [facility] with her next appointment in October 2015 with follow-up pending.  5. ? Glucose intolerance, random glucose 129 mg/dl   The study was on a non-fasting sample but raises minor concerns due to associated risk factors. Cynthia Barrera will monitor fasting and 2h post-prandial glucoses at home. The glucose log was reported anecdotally and considered "normal" by the patient. We'll continue weekly home monitoring and  recording noting that HbA1c was reported as 5.8% and BMP was essentially normal with a random glucose of 89 mg/dl.  6. Osteoporosis:  DEXA scan obtained recently with less than an ideal interpretation but was consistent with osteoporosis with a T  score of -2.8 in the left hip area. She also had follow-up mammography with additional follow-up pending due to "grey" area in the left breast. Dr. Frederic Jericho suggested she discuss "supplements" with me during this visit. Vitamin D levels should be measured but I seriously doubt that calcium supplement is indicated due to established hyperparathyroidism and a recent history of renal calculi. A repeat serum calcium will be obtained today.  7. Renal calculi, secondary to hyperparathyroidism:  Cynthia Barrera was scheduled to undergo urethral dilatation as well as lithotripsy with antiplatelet therapy to be discussed with Dr. Georg Ruddle office.  8. Vitamin B12 deficiency:  Cynthia Barrera was recently started on oral B12 therapy after an elevated MMA level was obtained - improvement was evident on a repeat study therefore oral replacement will continue.  Comment:  Cynthia Barrera's daughter recently was diagnosed with breast cancer and has gone through chemotherapy to date with a good response to date. This has been another source of "stress" but appears to be handled well although additional concern was raised recently when she was seen through [facility] with agreement only on the choice of chemotherapy.  Over the years we have demonstrated numerous circumstances of what can only be described as "white coat hypertension". The blood pressure elevation is transient and situational with resolution within 15-30 minutes. An example of this was seen previously with BP recorded 132/76 mmHg with Cynthia Barrera still "groggy" from anaesthesia for a cystoscopy this morning. I am also concerned about the PRN use of clonidine particularly in the evening hours - we may eventually do better with a BID schedule or not at all.  Laboratory studies ordered through Dr. Candiss Norse have not been obtained yet but when available will be reviewed without additional studies added.

## 2014-12-08 MED ORDER — POLYSACCHARIDE IRON COMPLEX 150 MG IRON CAPSULE
ORAL_CAPSULE | ORAL | Status: DC
Start: 2014-12-08 — End: 2015-03-15

## 2015-02-08 LAB — FERRITIN: FERRITIN: 22.6 ng/mL (ref 8.0–252.0)

## 2015-02-08 LAB — COMPREHENSIVE METABOLIC PNL, FASTING
ALBUMIN: 3.5 gm/dL (ref 3.4–5.0)
ALKALINE PHOSPHATASE: 85 U/L (ref 45–117)
ALT (SGPT): 14 U/L (ref 13–56)
AST (SGOT): 13 U/L — ABNORMAL LOW (ref 15–37)
BILIRUBIN, TOTAL: 0.4 mg/dL (ref 0.2–1.0)
BUN: 28 mg/dL — ABNORMAL HIGH (ref 7–18)
CALCIUM: 10 mg/dL (ref 8.5–10.1)
CALCIUM: 10 mg/dL (ref 8.5–10.1)
CARBON DIOXIDE: 25 mmoL/L (ref 21–32)
CHLORIDE: 107 mmol/L (ref 98–107)
CREATININE: 1.4 mg/dL — ABNORMAL HIGH (ref 0.6–1.0)
ESTIMATED GLOMERULAR FILTRATION RATE: 37 mL/min (ref >60)
GLUCOSE: 98 mg/dL (ref 74–106)
POTASSIUM: 3.9 mmol/L (ref 3.5–5.1)
SODIUM: 139 mmoL/L (ref 136–145)
TOTAL PROTEIN: 7 g/dL (ref 6.4–8.2)

## 2015-02-08 LAB — IRON STUDIES
IRON BINDING CAPACITY: 385 ug/dL (ref 250–450)
IRON SATURATION: 12 % — ABNORMAL LOW (ref 15–50)
IRON: 48 ug/dL — ABNORMAL LOW (ref 50–170)
TRANSFERRIN: 318 mg/dL (ref 200–360)

## 2015-02-08 LAB — CBC
BASOPHILS: 0.7 % (ref 0.0–2.0)
EOSINOPHIL: 4.3 % — ABNORMAL HIGH (ref 0.0–4.0)
HCT: 30.1 %{vol} — ABNORMAL LOW (ref 36.0–47.0)
HGB: 9.3 g/dL — ABNORMAL LOW (ref 12.0–15.0)
LYMPHOCYTES: 20.5 % (ref 20.0–35.0)
MCH: 23.1 pg — ABNORMAL LOW (ref 27.0–32.0)
MCHC: 30.8 g/dL — ABNORMAL LOW (ref 32.0–36.0)
MCV: 75 fL — ABNORMAL LOW (ref 80–100)
MONOCYTES: 9.4 % — ABNORMAL HIGH (ref 0.0–8.0)
MPV: 7.9 fL (ref 6.6–9.3)
PLATELET COUNT: 311 K/cu mm (ref 140–450)
PMN'S: 65.1 % (ref 50.0–78.0)
RBC: 4 10*6/uL — ABNORMAL LOW (ref 4.20–5.40)
RDW: 17.6 % — ABNORMAL HIGH (ref 12.0–15.0)
WBC: 6.4 K/cu mm (ref 4.8–10.8)

## 2015-02-08 LAB — LIPID PANEL
CHOLESTEROL: 167 mg/dL
HDL-CHOLESTEROL: 42 mg/dL
LDL (CALCULATED): 97 mg/dL
TRIGLYCERIDES: 139 mg/dL
VLDL (CALCULATED): 28 mg/dL

## 2015-03-14 ENCOUNTER — Encounter (INDEPENDENT_AMBULATORY_CARE_PROVIDER_SITE_OTHER): Payer: Medicare Other | Admitting: Endocrinology, Diabetes, & Metabolism

## 2015-03-15 ENCOUNTER — Encounter (INDEPENDENT_AMBULATORY_CARE_PROVIDER_SITE_OTHER): Payer: Self-pay | Admitting: Endocrinology, Diabetes, & Metabolism

## 2015-03-15 ENCOUNTER — Ambulatory Visit (INDEPENDENT_AMBULATORY_CARE_PROVIDER_SITE_OTHER): Payer: Medicare Other | Admitting: Internal Medicine

## 2015-03-15 ENCOUNTER — Encounter (INDEPENDENT_AMBULATORY_CARE_PROVIDER_SITE_OTHER): Payer: Self-pay | Admitting: Internal Medicine

## 2015-03-15 ENCOUNTER — Ambulatory Visit (INDEPENDENT_AMBULATORY_CARE_PROVIDER_SITE_OTHER): Payer: Medicare Other | Admitting: Endocrinology, Diabetes, & Metabolism

## 2015-03-15 VITALS — BP 152/76 | HR 92 | Ht 65.0 in | Wt 188.0 lb

## 2015-03-15 VITALS — BP 152/76 | HR 63 | Resp 20 | Ht 65.0 in | Wt 188.0 lb

## 2015-03-15 DIAGNOSIS — F419 Anxiety disorder, unspecified: Secondary | ICD-10-CM

## 2015-03-15 DIAGNOSIS — E034 Atrophy of thyroid (acquired): Principal | ICD-10-CM

## 2015-03-15 DIAGNOSIS — K5900 Constipation, unspecified: Secondary | ICD-10-CM

## 2015-03-15 DIAGNOSIS — G4733 Obstructive sleep apnea (adult) (pediatric): Secondary | ICD-10-CM

## 2015-03-15 DIAGNOSIS — R5383 Other fatigue: Secondary | ICD-10-CM

## 2015-03-15 DIAGNOSIS — D509 Iron deficiency anemia, unspecified: Secondary | ICD-10-CM | POA: Insufficient documentation

## 2015-03-15 DIAGNOSIS — E038 Other specified hypothyroidism: Secondary | ICD-10-CM

## 2015-03-15 DIAGNOSIS — J40 Bronchitis, not specified as acute or chronic: Secondary | ICD-10-CM

## 2015-03-15 MED ORDER — HELP MEDICATION
Status: DC
Start: 2015-03-15 — End: 2016-12-25

## 2015-03-15 MED ORDER — DULOXETINE 60 MG CAPSULE,DELAYED RELEASE
60.0000 mg | DELAYED_RELEASE_CAPSULE | Freq: Every day | ORAL | Status: DC
Start: 2015-03-15 — End: 2016-03-27

## 2015-03-15 MED ORDER — AMOXICILLIN 500 MG-POTASSIUM CLAVULANATE 125 MG TABLET
1.0000 | ORAL_TABLET | Freq: Two times a day (BID) | ORAL | Status: DC
Start: 2015-03-15 — End: 2015-06-21

## 2015-03-15 MED ORDER — HYDROXYZINE HCL 25 MG TABLET
25.0000 mg | ORAL_TABLET | Freq: Three times a day (TID) | ORAL | Status: DC | PRN
Start: 2015-03-15 — End: 2016-12-04

## 2015-03-15 MED ORDER — LACTULOSE 10 GRAM/15 ML ORAL SOLUTION
30.0000 mL | Freq: Every day | ORAL | Status: DC
Start: 2015-03-15 — End: 2016-03-27

## 2015-03-15 MED ORDER — POLYSACCHARIDE IRON COMPLEX 150 MG IRON CAPSULE
ORAL_CAPSULE | ORAL | Status: DC
Start: 2015-03-15 — End: 2016-03-26

## 2015-03-15 NOTE — Progress Notes (Signed)
[place]  Date of Service: 03/15/2015    Chief Complaint:   Chief Complaint   Patient presents with    Lethargy       History  HPI  Taking Cymbalta tol 60 mg and atarax once a day and helping with anxiety  Sleep same as before bed at 1030 fall asleep quickly, awake 2-3 x a night not sure why awake, awake in the morning fatigue.      Past 3 days increase coughing when lie down. No fever or chills     One episode of Afib x 3 hrs then resolved by self.  Seems to happen when she first lie down to sleep.  Has minimal sob and chest pressure when has these episodes.     Gets abdominal cramping for the past 3 years when has BM's.  But had an episode of cramping few days ago in the evening requiring her to go to the bathroom 4-5 x over a 74min period with stool with each episode then resolve.  States stool is soft.  Had c-scope 2 yrs ago which was normal x tics.    Current Outpatient Prescriptions   Medication Sig    amLODIPine (NORVASC) 5 mg Oral Tablet Take 5 mg by mouth Once a day    amoxicillin-pot clavulanate (AUGMENTIN) 500-125 mg Oral Tablet Take 1 Tab by mouth Every 12 hours    apixaban (ELIQUIS) 5 mg Oral Tablet Take 5 mg by mouth Twice daily    aspirin (ECOTRIN) 81 mg Oral Tablet, Delayed Release (E.C.) Take 81 mg by mouth Once a day    cloNIDine HCl (CATAPRES) 0.1 mg Oral Tablet Take 1 Tab (0.1 mg total) by mouth Twice daily    DULoxetine (CYMBALTA DR) 60 mg Oral Capsule, Delayed Release(E.C.) Take 1 Cap (60 mg total) by mouth Once a day    fluticasone (FLONASE) 50 mcg/actuation Nasal Spray, Suspension 2 Sprays by Each Nostril route Once a day    hydrochlorothiazide (HYDRODIURIL) 25 mg Oral Tablet Take 1 Tab (25 mg total) by mouth Once a day    hydrOXYzine HCl (ATARAX) 25 mg Oral Tablet Take 1 Tab (25 mg total) by mouth Three times a day as needed for Itching or Anxiety    lactulose (ENULOSE) 10 gram/15 mL Oral Solution Take 30 mL by mouth Once  a day As needed for constipation    levothyroxine (SYNTHROID) 75 mcg Oral Tablet Take 1 Tab (75 mcg total) by mouth Once a day    lisinopril (PRINIVIL) 40 mg Oral Tablet Take 1 Tab (40 mg total) by mouth Once a day    Non-Formulary/Special Preparation (MEDICATION HELP) provent starter pack    Sig 1 patch on each nostril at bedtime    polysaccharide iron complex (FERREX 150) 150 mg iron Oral Capsule TAKE ONE CAPSULE BY MOUTH ONCE DAILY    sotalol (BETAPACE) 80 mg Oral Tablet Take 1 Tab (80 mg total) by mouth Twice daily       Review of Systems    Examination  Vitals: BP 152/76 mmHg   Pulse 63   Resp 20   Ht 1.651 m (5\' 5" )   Wt 85.276 kg (188 lb)   BMI 31.28 kg/m2   SpO2 92%  Physical Exam   Constitutional: She appears well-developed and well-nourished.   HENT:   Head: Atraumatic.   Right Ear: External ear normal.   Left Ear: External ear normal.  Clear PND    Neck: Neck supple.   Cardiovascular: Normal rate, regular rhythm and normal heart sounds.    No murmur heard.  Pulmonary/Chest: Effort normal and breath sounds normal. She has no wheezes. She exhibits no tenderness.   Abdominal: Soft. Bowel sounds are normal.   Lymphadenopathy:     She has no cervical adenopathy.   Vitals reviewed.    Ortho Exam    Results  Comprehensive Metabolic Profile    Lab Results   Component Value Date/Time    SODIUM 139 02/08/2015 11:20 AM    POTASSIUM 3.9 02/08/2015 11:20 AM    CHLORIDE 107 02/08/2015 11:20 AM    CARBON DIOXIDE 25 02/08/2015 11:20 AM    BUN 28* 02/08/2015 11:20 AM    CREATININE 1.4* 02/08/2015 11:20 AM    GLUCOSE 98 02/08/2015 11:20 AM    Lab Results   Component Value Date/Time    CALCIUM 10.0 02/08/2015 11:20 AM    TOTAL PROTEIN 7.0 02/08/2015 11:20 AM    AST (SGOT) 13* 02/08/2015 11:20 AM    ALT (SGPT) 14 02/08/2015 11:20 AM        CBC  Diff   Lab Results   Component Value Date/Time    WBC 6.4 02/08/2015 11:20 AM    HGB 9.3* 02/08/2015 11:20 AM    HCT 30.1* 02/08/2015 11:20 AM    PLATELET COUNT 311 02/08/2015  11:20 AM    RBC 4.00* 02/08/2015 11:20 AM    MCV 75* 02/08/2015 11:20 AM    MCHC 30.8* 02/08/2015 11:20 AM    MCH 23.1* 02/08/2015 11:20 AM    RDW 17.6* 02/08/2015 11:20 AM    MPV 7.9 02/08/2015 11:20 AM    Lab Results   Component Value Date/Time    PMN'S 65.1 02/08/2015 11:20 AM    LYMPHOCYTES 20.5 02/08/2015 11:20 AM    EOSINOPHIL 4.3* 02/08/2015 11:20 AM    MONOCYTES 9.4* 02/08/2015 11:20 AM    BASOPHILS 0.7 02/08/2015 11:20 AM        Lab Results   Component Value Date    CHOLESTEROL 167 02/08/2015    HDLCHOL 42 02/08/2015    LDLCHOL 97 02/08/2015    TRIG 139 02/08/2015     Comprehensive Metabolic Profile    Lab Results   Component Value Date/Time    SODIUM 139 02/08/2015 11:20 AM    POTASSIUM 3.9 02/08/2015 11:20 AM    CHLORIDE 107 02/08/2015 11:20 AM    CARBON DIOXIDE 25 02/08/2015 11:20 AM    BUN 28* 02/08/2015 11:20 AM    CREATININE 1.4* 02/08/2015 11:20 AM    GLUCOSE 98 02/08/2015 11:20 AM    Lab Results   Component Value Date/Time    CALCIUM 10.0 02/08/2015 11:20 AM    TOTAL PROTEIN 7.0 02/08/2015 11:20 AM    AST (SGOT) 13* 02/08/2015 11:20 AM    ALT (SGPT) 14 02/08/2015 11:20 AM         Lab Results   Component Value Date/Time    IRON BINDING CAPACITY 385 02/08/2015 11:20 AM    IRON SATURATION 12* 02/08/2015 11:20 AM   trans fer 318  Iron 48    Fer 22.6 increase from previous  Diagnosis and Plan  1. Bronchitis    2. IDA (iron deficiency anemia)    3. OSA (obstructive sleep apnea)    4. Iron deficiency anemia    5. Anxiety    6. Fatigue    7. Constipation    1. Bronchitis  Resolving  if sym worsen then get antibiotics filled else continue using the flonase  - amoxicillin-pot clavulanate (AUGMENTIN) 500-125 mg Oral Tablet; Take 1 Tab by mouth Every 12 hours  Dispense: 20 Tab; Refill: 0    2. IDA (iron deficiency anemia)  Fer improving continue iron and will recheck in 3 months  - CBC; Future  - CBC    3. OSA (obstructive sleep apnea)  Not tol mask for CPAP continues with fatigue and have treated sources of  fatigue (ida) with continued fatigue therefore at this point have d/w pt trial of provent nasel patches to auto pap her at night  - Non-Formulary/Special Preparation (MEDICATION HELP); provent starter pack    Sig 1 patch on each nostril at bedtime  Dispense: 1 Each; Refill: 0    4. Iron deficiency anemia  Fer improving continue the iron  - polysaccharide iron complex (FERREX 150) 150 mg iron Oral Capsule; TAKE ONE CAPSULE BY MOUTH ONCE DAILY  Dispense: 90 Cap; Refill: 3    5. Anxiety  Stable   - hydrOXYzine HCl (ATARAX) 25 mg Oral Tablet; Take 1 Tab (25 mg total) by mouth Three times a day as needed for Itching or Anxiety  Dispense: 60 Tab; Refill: 5  - DULoxetine (CYMBALTA DR) 60 mg Oral Capsule, Delayed Release(E.C.); Take 1 Cap (60 mg total) by mouth Once a day  Dispense: 30 Cap; Refill: 5    6. Fatigue  Unchanged see osa above  - hydrOXYzine HCl (ATARAX) 25 mg Oral Tablet; Take 1 Tab (25 mg total) by mouth Three times a day as needed for Itching or Anxiety  Dispense: 60 Tab; Refill: 5  - DULoxetine (CYMBALTA DR) 60 mg Oral Capsule, Delayed Release(E.C.); Take 1 Cap (60 mg total) by mouth Once a day  Dispense: 30 Cap; Refill: 5    7. Constipation  Abd cramps suspect not evacuating completely therefore will add lactulose to help evacuate all stool  - lactulose (ENULOSE) 10 gram/15 mL Oral Solution; Take 30 mL by mouth Once a day As needed for constipation  Dispense: 473 mL; Refill: 5    8. Elevated BP to see dr Raquel Sarna who manages this.  Orders Placed This Encounter    CBC    amoxicillin-pot clavulanate (AUGMENTIN) 500-125 mg Oral Tablet    Non-Formulary/Special Preparation (MEDICATION HELP)    polysaccharide iron complex (FERREX 150) 150 mg iron Oral Capsule    hydrOXYzine HCl (ATARAX) 25 mg Oral Tablet    DULoxetine (CYMBALTA DR) 60 mg Oral Capsule, Delayed Release(E.C.)    lactulose (ENULOSE) 10 gram/15 mL Oral Solution       Darleen Crocker, DO

## 2015-03-15 NOTE — Progress Notes (Signed)
1. Hypertension, BP 152/76 mmHg:   Since her previous visit, with medication adjustment, she has done considerably better with stable home BP monitoring and only one further episode of a. Fib. Cynthia Barrera continues sotalol 80 mg BID, lisinopril 40 mg Qam and HCTz 25 mg daily. Note that clonidine and amlodipine remain in her medication list. On this regimen, BP control reviewed through her daily log has essentially returned to normal with only a few, transient exceptions (usually prior to bedtime). She continues follow-up with Dr. Delia Chimes with a pending exercise stress test. Atrial fibrillation diagnosed during a previous admission to [place] for bronchitis has not been a problem and sinus rhythm has been maintained. Her current maintenance on Eliquis and ASA remains.  Home monitoring has been obtained as much as five times daily with the expected variation - the trend however was for higher pressures in the evening than the morning. Cynthia Barrera also continues to use clonidine as a prn medication although I do not feel this is indicated and may contribute to "rebound" effects. The medication has also contributed to dryness of the mucous membranes as well as drowziness. We'll resume lisinopril 40 mg Qam, monitor her BMP and discontinue clonidine as a prn med (?). Rather than add additional medications to control evening BP elevation, a rearrangement of timing may be as helpful. We'll continue Sotalol in the morning, HCTz at noon and lisinopril 40 mg Qpm with a potential "split" dosage.  March 2016 labs included BUN 28, creatinine 1.4, eGFR 37 ml/min. Lipids were reported as cholesterol 167, triglycerides 139, HDL 42 and LDL 97.  2. Hypothyroid:   Synthroid was reduced from 100 mcg Qam to 75 mcg Qam based on [place] studies including TSH 0.474 mIU/mL (normal 0.370-4.420) and free thyroxine 2.10 ng/dL (normal 0.75-2.00). This is a considerably greater reduction than I would have recommended if a reduction was  indicated at all. Since she has now taken the 75 mcg dosage for 8 weeks - the TSH obtained during the April hospitalization will suffice - TSH was normal at 2.13 uIU/mL. Further confirmation will be considered on return with TSH and free thyroxine ordered for her next visit.  Thyroid examination was performed since Dr. Frederic Jericho felt there was a "change" - on exam, the prominence of both lobes was again noted with nodularity over the isthmic region appearing unchanged from previous exams - findings which were again confirmed. Thyroid ultrasound will be considered on return.  3. 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 Plaquenil toxicity:   Cynthia Barrera notes no significant change in visual problems but feels that there may be some worsening. She is scheduled to return for follow-up at [facility] with her next appointment in October 2015 with follow-up pending.  5. ? Glucose intolerance, random glucose 129 mg/dl   The study was on a non-fasting sample but raises minor concerns due to associated risk factors. Cynthia Barrera will monitor fasting and 2h post-prandial glucoses at home. The glucose log was reported anecdotally and considered "normal" by the patient. We'll continue weekly home monitoring and recording noting that HbA1c was reported as 5.8% and BMP was essentially normal with a random glucose of 89 mg/dl.  6. Osteoporosis:  DEXA scan obtained recently with less than an ideal interpretation but was consistent with osteoporosis with a T score of -2.8 in the left hip area. She also had  follow-up mammography with additional follow-up pending due to "grey" area in the left breast. Dr. Frederic Jericho suggested she discuss "supplements" with me during this visit. Vitamin D levels should be measured but I seriously doubt that calcium supplement is indicated due to established  hyperparathyroidism and a recent history of renal calculi. A repeat serum calcium will be obtained today.  7. Renal calculi, secondary to hyperparathyroidism:  Cynthia Barrera was scheduled to undergo urethral dilatation as well as lithotripsy with antiplatelet therapy to be discussed with Dr. Georg Ruddle office.  8. Vitamin B12 deficiency:  Cynthia Barrera was recently started on oral B12 therapy after an elevated MMA level was obtained - improvement was evident on a repeat study therefore oral replacement will continue.  9. Anemia, iron deficiency:  Studies through Dr. Candiss Norse in March 2016 revealed serum iron 48, % saturation 12% and ferritin 22.6 (8.0-252) with Hgb 9.3 gm/dl and Hct 30.1%.  Comment:  Cynthia Barrera's daughter recently was diagnosed with breast cancer and has gone through chemotherapy to date with a good response to date. This has been another source of "stress" but appears to be handled well although additional concern was raised recently when she was seen through [facility] with agreement only on the choice of chemotherapy.  Over the years we have demonstrated numerous circumstances of what can only be described as "white coat hypertension". The blood pressure elevation is transient and situational with resolution within 15-30 minutes. An example of this was seen previously with BP recorded 132/76 mmHg with Cynthia Barrera still "groggy" from anaesthesia for a cystoscopy this morning. I am also concerned about the PRN use of clonidine particularly in the evening hours - we may eventually do better with a BID schedule or not at all.

## 2015-06-16 ENCOUNTER — Telehealth (INDEPENDENT_AMBULATORY_CARE_PROVIDER_SITE_OTHER): Payer: Self-pay | Admitting: Endocrinology, Diabetes, & Metabolism

## 2015-06-16 DIAGNOSIS — N189 Chronic kidney disease, unspecified: Secondary | ICD-10-CM

## 2015-06-16 DIAGNOSIS — E034 Atrophy of thyroid (acquired): Secondary | ICD-10-CM

## 2015-06-16 DIAGNOSIS — D509 Iron deficiency anemia, unspecified: Secondary | ICD-10-CM

## 2015-06-21 ENCOUNTER — Ambulatory Visit (INDEPENDENT_AMBULATORY_CARE_PROVIDER_SITE_OTHER): Payer: Medicare Other | Admitting: Internal Medicine

## 2015-06-21 ENCOUNTER — Encounter (INDEPENDENT_AMBULATORY_CARE_PROVIDER_SITE_OTHER): Payer: Self-pay | Admitting: Internal Medicine

## 2015-06-21 ENCOUNTER — Ambulatory Visit (INDEPENDENT_AMBULATORY_CARE_PROVIDER_SITE_OTHER): Payer: Medicare Other | Admitting: Endocrinology, Diabetes, & Metabolism

## 2015-06-21 VITALS — BP 124/72 | HR 83 | Ht 65.0 in | Wt 188.6 lb

## 2015-06-21 VITALS — BP 124/72 | HR 86 | Ht 65.0 in | Wt 188.6 lb

## 2015-06-21 DIAGNOSIS — E039 Hypothyroidism, unspecified: Secondary | ICD-10-CM

## 2015-06-21 DIAGNOSIS — E213 Hyperparathyroidism, unspecified: Secondary | ICD-10-CM

## 2015-06-21 DIAGNOSIS — E034 Atrophy of thyroid (acquired): Secondary | ICD-10-CM

## 2015-06-21 DIAGNOSIS — R5383 Other fatigue: Secondary | ICD-10-CM

## 2015-06-21 DIAGNOSIS — D509 Iron deficiency anemia, unspecified: Secondary | ICD-10-CM

## 2015-06-21 DIAGNOSIS — E559 Vitamin D deficiency, unspecified: Secondary | ICD-10-CM

## 2015-06-21 DIAGNOSIS — E538 Deficiency of other specified B group vitamins: Secondary | ICD-10-CM

## 2015-06-21 DIAGNOSIS — E038 Other specified hypothyroidism: Secondary | ICD-10-CM

## 2015-06-21 DIAGNOSIS — N289 Disorder of kidney and ureter, unspecified: Secondary | ICD-10-CM

## 2015-06-21 DIAGNOSIS — I4891 Unspecified atrial fibrillation: Secondary | ICD-10-CM

## 2015-06-21 DIAGNOSIS — I10 Essential (primary) hypertension: Secondary | ICD-10-CM

## 2015-06-21 DIAGNOSIS — G4733 Obstructive sleep apnea (adult) (pediatric): Secondary | ICD-10-CM

## 2015-06-21 NOTE — Progress Notes (Signed)
Ravalli  Date of Service: 06/21/2015    Chief Complaint:   Chief Complaint   Patient presents with    Fatigue       History  HPI  Saw dr Barkley Bruns cards ordered blood test and stress test on this Monday and referred to see pulm later this month due to fatigue unclear source.    If walk long distance or vacume or raise arm over head feel like has to have BM's.    Sit in the chair and  Fall sleep a lot fatigue not improve     Thinks took Cymbalta for 1 month. But not sure review of med rec did have this med refilled, but only one rx filled in April so at most took it for 1 month if data correct.        Review of Systems    Examination  Vitals: BP 124/72 mmHg   Pulse 83   Ht 1.651 m (5\' 5" )   Wt 85.548 kg (188 lb 9.6 oz)   BMI 31.38 kg/m2  Physical Exam   Constitutional: She appears well-developed and well-nourished.   HENT:   Head: Normocephalic and atraumatic.   Eyes: Pupils are equal, round, and reactive to light.   Neck: Neck supple.   Cardiovascular: Normal rate, regular rhythm and normal heart sounds.    No murmur heard.  Pulmonary/Chest: Effort normal and breath sounds normal. She has no wheezes.   Abdominal: Soft. Bowel sounds are normal. She exhibits no mass.   Musculoskeletal: She exhibits no edema or tenderness.   Lymphadenopathy:     She has no cervical adenopathy.   Vitals reviewed.    Ortho Exam    Results  06/14/15 cmp normal x bun 31, creatine 1.5 ca 10.3  Cbc h/h 10.4/33.1 mcv 74 rest normal  TSH 1.490 B12 1942  Diagnosis and Plan  1. IDA (iron deficiency anemia)    2. OSA (obstructive sleep apnea)    3. Fatigue    4. Atrial fibrillation    5. Hyperparathyroidism    6. Hypothyroidism    7. Renal insufficiency    8. B12 deficiency    1. IDA (iron deficiency anemia)  Hx of ida with repeat blood work with minimal change in mcv but with slight improvement in h/h will rechech fer to see if adequate or still low, if still low then will need to set her  up for venifer 300 mg infusion since taking oral iron reg.  Pt to be out of town for some family reunion late next week and would like it to be scheduled for after the 1st weekend of aug if need.    2. OSA (obstructive sleep apnea)  Not tol CPAP previously, could be source of some of the fatigue, but look for other sources. To see pulm alter this month    3. Fatigue  Review of previous labs pt with B12 def   - Phosphorus; Future  - METHYLMALONIC ACID (MMA), QUANTITATIVE, PLASMA; Future  - Phosphorus  - METHYLMALONIC ACID (MMA), QUANTITATIVE, PLASMA    4. Atrial fibrillation  On sotalol and reg today    5. Hyperparathyroidism  Recheck ? If increasing fatique  - VITAMIN D 25 HYDROXY; Future  - IONIZED CALCIUM; Future  - PARATHYROID HORMONE (PTH) INTACT - ESOTERIX; Future  - VITAMIN D 25 HYDROXY  - IONIZED CALCIUM  - PARATHYROID HORMONE (PTH) INTACT - ESOTERIX  6. Hypothyroidism  repeat TSH and free t4 normal so not source of fatigue.    7. Renal insufficiency  Increase creat and bun recent labs have left message at pt's home phone to decrease the HCTZ to 1/2 a pill and recheck labs next week  - BASIC METABOLIC PANEL; Future  - Uric Acid; Future  - BASIC METABOLIC PANEL  - Uric Acid    8. B12 deficiency  Even though serum B12 increase check mma to ensure is replaced if not then may need to change to shots.  - METHYLMALONIC ACID (MMA), QUANTITATIVE, PLASMA; Future  - METHYLMALONIC ACID (MMA), QUANTITATIVE, PLASMA    Reviewed her previous labs on 06/22/15 due to sorian connection issues on date of exam, have then ordered the below labs and left message at pt's home since no one home when I called on 06/22/15  Orders Placed This Encounter    Phosphorus    VITAMIN D 25 HYDROXY    IONIZED CALCIUM    METHYLMALONIC ACID (MMA), QUANTITATIVE, PLASMA    PARATHYROID HORMONE (PTH) INTACT - ESOTERIX    BASIC METABOLIC PANEL    Uric Acid    FERRITIN       Javon Hupfer, DO

## 2015-06-21 NOTE — Progress Notes (Signed)
[facility]  [address]  [place] [address]  [phone]        Patient name:  Cynthia Barrera  MRN:  [id number]  DOB:  1940/12/30  DATE:  06/21/2015      Filed Vitals:    06/21/15 1156   BP: 124/72   Pulse: 86   Height: 1.651 m ('5\' 5"' )   Weight: 85.548 kg (188 lb 9.6 oz)       1. Hypertension, BP 124/72 mmHg:   Since her previous visit, with medication adjustment, she has done considerably better with stable home BP monitoring. Essynce continues sotalol 80 mg BID, lisinopril 40 mg Qam and HCTz 25 mg daily. Note that clonidine and amlodipine remain in her medication list. On this regimen, BP control reviewed through her daily log has essentially returned to normal with only a few, transient exceptions (usually prior to bedtime). She continues follow-up with Dr. Delia Chimes with a pending exercise stress test. Atrial fibrillation diagnosed during a previous admission to [place] for bronchitis has not been a problem at the time of her last visit and sinus rhythm had been maintained until recently. Her current maintenance on Eliquis and ASA remains.  Rakia notes that she has had two episodes of recurrent atrial fibrillation with current heart monitor being worn and an ECHO and stress test planned for the first of the week. She has also noted shortness of breath as well as continued fatigue - the pending studies should help clarify the situation.  Home monitoring has been obtained as much as five times daily with the expected variation - the trend however was for higher pressures in the evening than the morning. Debra also continues to use clonidine as a prn medication although I do not feel this is indicated and may contribute to "rebound" effects. The medication has also contributed to dryness of the mucous membranes as well as drowziness. We'll resume lisinopril 40 mg Qam, monitor her BMP and discontinue clonidine as a prn med (?). Rather than  add additional medications to control evening BP elevation, a rearrangement of timing may be as helpful. We'll continue Sotalol in the morning, HCTz at noon and lisinopril 40 mg Qpm with a potential "split" dosage.  March 2016 labs included BUN 31, creatinine 1.5, eGFR 34 ml/min. Lipids were reported as cholesterol 167, triglycerides 139, HDL 42 and LDL 97.  2. Hypothyroid:   Synthroid was reduced from 100 mcg Qam to 75 mcg Qam based on [place] studies including TSH 0.474 mIU/mL (normal 0.370-4.420) and free thyroxine 2.10 ng/dL (normal 0.75-2.00). This is a considerably greater reduction than I would have recommended if a reduction was indicated at all. Since she has now taken the 75 mcg dosage for 8 weeks - the TSH obtained during the April hospitalization will suffice - TSH was normal at 2.13 uIU/mL. Further confirmation was obtained with TSH of 1.490 uIU/ml and free thyroxine 1.4 ng/dl from July 2016 through Dr Jonnie Finner office.  Thyroid examination was performed since Dr. Frederic Jericho felt there was a "change" - on exam, the prominence of both lobes was again noted with nodularity over the isthmic region appearing unchanged from previous exams - findings which were again confirmed. Thyroid ultrasound will be considered on return but deferred fue to current cardiac evaluation.  3. Hyperparathyroid, stable, serum Ca++ 10.2 mg/dl, status post renal calculi last year, Liz is a surgical candidate for all intents and purposes but to date has  not elected this avenue:  4. Plaquenil toxicity:   Zeriah notes no significant change in visual problems but feels that there may be some worsening. She is scheduled to return for follow-up at [facility] with her next appointment in October 2015 with follow-up pending.  5. ? Glucose intolerance, random glucose 129 mg/dl   The study was on a non-fasting sample but raises minor concerns due to associated risk factors. Carlee will monitor fasting and 2h post-prandial  glucoses at home. The glucose log was reported anecdotally and considered "normal" by the patient. We'll continue weekly home monitoring and recording noting that HbA1c was reported as 5.8% and BMP was essentially normal with a random glucose of 89 mg/dl.  6. Osteoporosis:  DEXA scan obtained recently with less than an ideal interpretation but was consistent with osteoporosis with a T score of -2.8 in the left hip area. She also had follow-up mammography with additional follow-up pending due to "grey" area in the left breast. Dr. Frederic Jericho suggested she discuss "supplements" with me during this visit. Vitamin D levels should be measured but I seriously doubt that calcium supplement is indicated due to established hyperparathyroidism and a recent history of renal calculi. A repeat serum calcium was reported as 10.2 mg/dl.  7. Renal calculi, secondary to hyperparathyroidism:  Amando was scheduled to undergo urethral dilatation as well as lithotripsy with antiplatelet therapy to be discussed with Dr. Georg Ruddle office.  8. Vitamin B12 deficiency:  Dorreen was recently started on oral B12 therapy after an elevated MMA level was obtained - improvement was evident on a repeat study therefore oral replacement will continue.  9. Anemia, iron deficiency:  Studies through Dr. Candiss Norse in March 2016 revealed serum iron 48, % saturation 12% and ferritin 22.6 (8.0-252) with Hgb 9.3 gm/dl and Hct 30.1%.  Comment:  Greenley's daughter recently was diagnosed with breast cancer and has gone through chemotherapy to date with a good response to date. This has been another source of "stress" but appears to be handled well although additional concern was raised recently when she was seen through [facility] with agreement only on the choice of chemotherapy.  Over the years we have demonstrated numerous circumstances of what can only be described as "white coat hypertension". The blood pressure elevation is transient and situational with  resolution within 15-30 minutes. An example of this was seen previously with BP recorded 132/76 mmHg with Inez Catalina still "groggy" from anaesthesia for a cystoscopy this morning. I am also concerned about the PRN use of clonidine particularly in the evening hours - we may eventually do better with a BID schedule or not at all.    Problem List Items Addressed This Visit     Essential hypertension    Hypothyroidism    Vitamin D deficiency    Hyperparathyroidism    Atrial fibrillation    IDA (iron deficiency anemia) - Primary          Bobbe Medico, MD  06/21/2015, 12:14

## 2015-06-22 DIAGNOSIS — E538 Deficiency of other specified B group vitamins: Secondary | ICD-10-CM | POA: Insufficient documentation

## 2015-06-25 LAB — BASIC METABOLIC PANEL
BUN: 38 mg/dL — ABNORMAL HIGH (ref 7–18)
CALCIUM: 10.2 mg/dL — ABNORMAL HIGH (ref 8.5–10.1)
CHLORIDE: 108 mmol/L — ABNORMAL HIGH (ref 98–107)
CO2 TOTAL: 24 mmoL/L (ref 21–32)
CO2 TOTAL: 24 mmol/L (ref 21–32)
CREATININE: 1.7 mg/dL — ABNORMAL HIGH (ref 0.6–1.0)
ESTIMATED GFR: 29 mL/min (ref >60)
GLUCOSE: 134 mg/dL — ABNORMAL HIGH (ref 74–106)
POTASSIUM: 3.7 mmol/L (ref 3.5–5.1)
SODIUM: 141 mmol/L (ref 136–145)

## 2015-06-25 LAB — URIC ACID: URIC ACID: 9.7 mg/dL — ABNORMAL HIGH (ref 2.6–6.0)

## 2015-06-25 LAB — PHOSPHORUS: PHOSPHORUS: 2.4 mg/dL — ABNORMAL LOW (ref 2.5–4.9)

## 2015-06-25 LAB — VITAMIN D 25 HYDROXY: VITAMIN D 25 HYDROXY: 19.2 ng/mL — ABNORMAL LOW (ref 30.0–100.0)

## 2015-06-25 LAB — IONIZED CALCIUM: IONIZED CALCIUM: 1.43 mmoL/L — ABNORMAL HIGH (ref 1.12–1.32)

## 2015-06-25 LAB — FERRITIN: FERRITIN: 7.8 ng/mL — ABNORMAL LOW (ref 8.0–252.0)

## 2015-06-26 ENCOUNTER — Other Ambulatory Visit (INDEPENDENT_AMBULATORY_CARE_PROVIDER_SITE_OTHER): Payer: Self-pay

## 2015-06-26 MED ORDER — IRON SUCROSE 100 MG IRON/5 ML INTRAVENOUS SOLUTION
300.0000 mg | Freq: Once | INTRAVENOUS | Status: AC
Start: 2015-06-26 — End: 2015-06-26

## 2015-06-26 NOTE — Telephone Encounter (Signed)
Labs return fer low at 7 range, has been taking oral iron previous scopes neg for source of bleed will set pt up for venifer 300 mg infusion [facility] after the 1st weekend of aug per pt request.    Ca increased, but PTH not back yet creat and bun increase dispite decreasing HCTZ.  Will have pt d/c HCTZ.

## 2015-06-27 ENCOUNTER — Other Ambulatory Visit (INDEPENDENT_AMBULATORY_CARE_PROVIDER_SITE_OTHER): Payer: Self-pay | Admitting: Internal Medicine

## 2015-06-27 DIAGNOSIS — D509 Iron deficiency anemia, unspecified: Secondary | ICD-10-CM

## 2015-06-27 LAB — PARATHYROID HORMONE (PTH) INTACT - REFERENCE LAB

## 2015-06-27 NOTE — Telephone Encounter (Signed)
ordering venifer 300 mg infusion x 3 weeks

## 2015-07-04 LAB — BASIC METABOLIC PANEL
BUN: 28 mg/dL — ABNORMAL HIGH (ref 7–18)
CALCIUM: 10.2 mg/dL — ABNORMAL HIGH (ref 8.5–10.1)
CHLORIDE: 109 mmol/L — ABNORMAL HIGH (ref 98–107)
CO2 TOTAL: 25 mmoL/L (ref 21–32)
CREATININE: 1.4 mg/dL — ABNORMAL HIGH (ref 0.6–1.0)
ESTIMATED GFR: 37 mL/min (ref >60)
GLUCOSE: 113 mg/dL — ABNORMAL HIGH (ref 74–106)
POTASSIUM: 4.3 mmol/L (ref 3.5–5.1)
SODIUM: 141 mmoL/L (ref 136–145)

## 2015-07-16 ENCOUNTER — Telehealth (INDEPENDENT_AMBULATORY_CARE_PROVIDER_SITE_OTHER): Payer: Self-pay | Admitting: Internal Medicine

## 2015-07-16 NOTE — Telephone Encounter (Signed)
Patient states her blood pressure has been up all weekend. Saturday 193/102, Sunday 174/95, Monday 182/102.  Per Ms Kaas at her last visit Dr Candiss Norse and Dr Raquel Sarna both took her off of her blood pressure medications.

## 2015-07-16 NOTE — Telephone Encounter (Signed)
Cynthia Barrera:    In reviewing the clinical notes, I could find no evidence whatsoever that either of [place] had discontinued [name]'s blood pressure medications. I did have her "re-arrange" the timing of her meds with sotalol taken in the morning and evening, HCT at noon and lisinopril 40 mg in the evening. She also had clonidine as a "prn" medication based on her BP readings.    [name]

## 2015-07-17 ENCOUNTER — Other Ambulatory Visit (INDEPENDENT_AMBULATORY_CARE_PROVIDER_SITE_OTHER): Payer: Self-pay | Admitting: Endocrinology, Diabetes, & Metabolism

## 2015-07-17 DIAGNOSIS — I10 Essential (primary) hypertension: Secondary | ICD-10-CM

## 2015-07-17 MED ORDER — CLONIDINE HCL 0.1 MG TABLET
0.1000 mg | ORAL_TABLET | Freq: Two times a day (BID) | ORAL | Status: DC
Start: 2015-07-17 — End: 2016-03-26

## 2015-07-17 NOTE — Telephone Encounter (Signed)
Patient advised but wants to speak with Dr Raquel Sarna about her medications.

## 2015-07-19 ENCOUNTER — Telehealth (INDEPENDENT_AMBULATORY_CARE_PROVIDER_SITE_OTHER): Payer: Self-pay | Admitting: Endocrinology, Diabetes, & Metabolism

## 2015-07-19 DIAGNOSIS — D509 Iron deficiency anemia, unspecified: Secondary | ICD-10-CM

## 2015-07-19 DIAGNOSIS — I10 Essential (primary) hypertension: Secondary | ICD-10-CM

## 2015-07-19 NOTE — Telephone Encounter (Signed)
Had 2 iron infusion, last one this past tue. And hand swelled post infusion but resolved by self.  States feels like going to pass out after the infusions but not pass out.    Pt wondering when to recheck labs post infusion.    Since pt with 2 infusions of venifer complete will stopp at this point will recheck cbc and fer in one month after 08/17/15 to see if need any more.    Also have d/c HCTZ due to increase in creat and repeat creat 7/25 with improved creat and decrease  In bun will recheck in 08/17/15    Pt agreeable to above

## 2015-07-19 NOTE — Telephone Encounter (Signed)
Patient is very confused about her blood work she has had recently. She is thinking she was supposed to have had her "blood levels" checked such as hemoglobin and iron. She is confused as to what blood work was done and wasn't done and what should've been done. She doesn't understand any of this. After her most recent iron infusion, she had swelling in her right hand and numbness in both hands that lasted up into the middle of that following night. She is anxious about having another infusion on Tuesday. [phone].

## 2015-07-20 NOTE — Telephone Encounter (Signed)
Lab orders mailed to patient.

## 2015-07-20 NOTE — Telephone Encounter (Signed)
Please mail lab orders for cbc bmp and fer to pt please put a note in it to remind her to do the blood work after 08/17/15  Thanks Per Dr. Candiss Norse.

## 2015-08-16 ENCOUNTER — Encounter (INDEPENDENT_AMBULATORY_CARE_PROVIDER_SITE_OTHER): Payer: Self-pay | Admitting: Internal Medicine

## 2015-08-29 ENCOUNTER — Encounter (INDEPENDENT_AMBULATORY_CARE_PROVIDER_SITE_OTHER): Payer: Self-pay | Admitting: Endocrinology, Diabetes, & Metabolism

## 2015-08-30 ENCOUNTER — Encounter (INDEPENDENT_AMBULATORY_CARE_PROVIDER_SITE_OTHER): Payer: Self-pay | Admitting: Internal Medicine

## 2015-09-27 ENCOUNTER — Encounter (INDEPENDENT_AMBULATORY_CARE_PROVIDER_SITE_OTHER): Payer: Medicare Other | Admitting: Internal Medicine

## 2015-10-01 ENCOUNTER — Encounter (INDEPENDENT_AMBULATORY_CARE_PROVIDER_SITE_OTHER): Payer: Self-pay | Admitting: Internal Medicine

## 2015-10-04 ENCOUNTER — Encounter (INDEPENDENT_AMBULATORY_CARE_PROVIDER_SITE_OTHER): Payer: Self-pay | Admitting: Endocrinology, Diabetes, & Metabolism

## 2015-10-04 ENCOUNTER — Ambulatory Visit (INDEPENDENT_AMBULATORY_CARE_PROVIDER_SITE_OTHER): Payer: Medicare Other | Admitting: Internal Medicine

## 2015-10-04 ENCOUNTER — Encounter (INDEPENDENT_AMBULATORY_CARE_PROVIDER_SITE_OTHER): Payer: Self-pay | Admitting: Internal Medicine

## 2015-10-04 ENCOUNTER — Ambulatory Visit (INDEPENDENT_AMBULATORY_CARE_PROVIDER_SITE_OTHER): Payer: Medicare Other | Admitting: Endocrinology, Diabetes, & Metabolism

## 2015-10-04 VITALS — BP 150/84 | HR 57 | Ht 64.0 in | Wt 188.2 lb

## 2015-10-04 VITALS — BP 150/84 | HR 57 | Resp 20 | Ht 64.0 in | Wt 188.2 lb

## 2015-10-04 DIAGNOSIS — D509 Iron deficiency anemia, unspecified: Secondary | ICD-10-CM

## 2015-10-04 DIAGNOSIS — Z9989 Dependence on other enabling machines and devices: Secondary | ICD-10-CM | POA: Insufficient documentation

## 2015-10-04 DIAGNOSIS — I480 Paroxysmal atrial fibrillation: Secondary | ICD-10-CM

## 2015-10-04 DIAGNOSIS — E213 Hyperparathyroidism, unspecified: Secondary | ICD-10-CM

## 2015-10-04 DIAGNOSIS — R5383 Other fatigue: Secondary | ICD-10-CM

## 2015-10-04 DIAGNOSIS — E785 Hyperlipidemia, unspecified: Secondary | ICD-10-CM

## 2015-10-04 DIAGNOSIS — E559 Vitamin D deficiency, unspecified: Principal | ICD-10-CM

## 2015-10-04 DIAGNOSIS — G4733 Obstructive sleep apnea (adult) (pediatric): Secondary | ICD-10-CM | POA: Insufficient documentation

## 2015-10-04 DIAGNOSIS — I10 Essential (primary) hypertension: Secondary | ICD-10-CM

## 2015-10-04 DIAGNOSIS — E039 Hypothyroidism, unspecified: Secondary | ICD-10-CM

## 2015-10-04 NOTE — Progress Notes (Signed)
[facility]  [address]  [place] [address]  [phone]        Patient name:  Cynthia Barrera  MRN:  [id number]  DOB:  09/21/1941  DATE:  10/04/2015      Filed Vitals:    10/04/15 1239   BP: 150/84   Pulse: 57   Height: 1.626 m ('5\' 4"' )   Weight: 85.367 kg (188 lb 3.2 oz)       1. Hypertension, BP 150/84 mmHg:   Since her previous visit, with medication adjustment, she has done considerably better with stable home BP monitoring. Cynthia Barrera continues sotalol 80 mg BID, lisinopril 40 mg Qam and HCTz 25 mg daily. Note that clonidine and amlodipine remain in her medication list. On this regimen, BP control reviewed through her daily log has essentially returned to normal with only a few, transient exceptions (usually prior to bedtime). She continues follow-up with Dr. Delia Chimes with a pending exercise stress test. Atrial fibrillation diagnosed during a previous admission to [place] for bronchitis has not been a problem at the time of her last visit and sinus rhythm had been maintained until recently. Her current maintenance on Eliquis and ASA remains.  Cynthia Barrera notes that she has had two episodes of recurrent atrial fibrillation with current heart monitor being worn and an ECHO and stress test planned for the first of the week. She has also noted shortness of breath as well as continued fatigue - the pending studies should help clarify the situation.  Home monitoring has been obtained as much as five times daily with the expected variation - the trend however was for higher pressures in the evening than the morning. Cynthia Barrera also continues to use clonidine as a prn medication although I do not feel this is indicated and may contribute to "rebound" effects. The medication has also contributed to dryness of the mucous membranes as well as drowziness. We'll resume lisinopril 40 mg Qam, monitor her BMP and discontinue clonidine as a prn med (?). Rather than  add additional medications to control evening BP elevation, a rearrangement of timing may be as helpful. We'll continue Sotalol in the morning, HCTz at noon and lisinopril 40 mg Qpm with a potential "split" dosage. The above BP is considerably higher than those recorded at home but will continue to be monitored closely.  March 2016 labs included BUN 31, creatinine 1.5, eGFR 34 ml/min. Lipids were reported as cholesterol 167, triglycerides 139, HDL 42 and LDL 97.  2. Hypothyroid:   Synthroid was reduced from 100 mcg Qam to 75 mcg Qam based on [place] studies including TSH 0.474 mIU/mL (normal 0.370-4.420) and free thyroxine 2.10 ng/dL (normal 0.75-2.00). This is a considerably greater reduction than I would have recommended if a reduction was indicated at all. Since she has now taken the 75 mcg dosage for 8 weeks - the TSH obtained during the April hospitalization will suffice - TSH was normal at 2.13 uIU/mL. Further confirmation was obtained with TSH of 1.490 uIU/ml and free thyroxine 1.4 ng/dl from July 2016 through Dr Jonnie Finner office.  Thyroid examination was performed since Dr. Frederic Jericho felt there was a "change" - on exam, the prominence of both lobes was again noted with nodularity over the isthmic region appearing unchanged from previous exams - findings which were again confirmed. Thyroid ultrasound will be considered on return but deferred fue to current cardiac evaluation.  3. Hyperparathyroid, stable:  Serum Ca++ 10.2 mg/dl, status  post renal calculi last year with iPTH in July 2016 of 159 pg/ml and vitamin D of 19.2 ng/mL. The ionized calcium was 1.43 mmol/L (normal 1.12-1.32). With prior history of renal calculi, osteoporosis and evident chronic renal disease, Cynthia Barrera is a surgical candidate for all intents and purposes but to date has not elected this avenue.  4. Plaquenil toxicity:   Cynthia Barrera notes no significant change in visual problems but feels that there may be some worsening. She is  scheduled to return for follow-up at [facility] with her next appointment in October 2015 with follow-up pending.  5. ? Glucose intolerance, random glucose 129 mg/dl   The study was on a non-fasting sample but raises minor concerns due to associated risk factors. Cynthia Barrera will monitor fasting and 2h post-prandial glucoses at home. The glucose log was reported anecdotally and considered "normal" by the patient. We'll continue weekly home monitoring and recording noting that HbA1c was reported as 5.8% and BMP was essentially normal with a random glucose of 89 mg/dl.  6. Osteoporosis:  DEXA scan obtained recently with less than an ideal interpretation but was consistent with osteoporosis with a T score of -2.8 in the left hip area. She also had follow-up mammography with additional follow-up pending due to "grey" area in the left breast. Dr. Frederic Jericho suggested she discuss "supplements" with me during this visit. Vitamin D levels should be measured but I seriously doubt that calcium supplement is indicated due to established hyperparathyroidism and a recent history of renal calculi. A repeat serum calcium was reported as 10.2 mg/dl.  7. Renal calculi, secondary to hyperparathyroidism:  Cynthia Barrera was scheduled to undergo urethral dilatation as well as lithotripsy with antiplatelet therapy to be discussed with Dr. Georg Ruddle office.  8. Vitamin B12 deficiency:  Cynthia Barrera was recently started on oral B12 therapy after an elevated MMA level was obtained - improvement was evident on a repeat study therefore oral replacement will continue.  9. Anemia, iron deficiency:  Studies through Dr. Candiss Norse in March 2016 revealed serum iron 48, % saturation 12% and ferritin 22.6 (8.0-252) with Hgb 9.3 gm/dl and Hct 30.1%. Since then she has had two iron infusions but she almost "passed out" after the last infusion - she recovered without in approximately one hour with no additional problems.  10. Sleep apnea, with full mask  tolerated:  Comment:  Cynthia Barrera's daughter recently was diagnosed with breast cancer and has gone through chemotherapy to date with a good response to date. This has been another source of "stress" but appears to be handled well although additional concern was raised recently when she was seen through [facility] with agreement only on the choice of chemotherapy.  Over the years we have demonstrated numerous circumstances of what can only be described as "white coat hypertension". The blood pressure elevation is transient and situational with resolution within 15-30 minutes. An example of this was seen previously with BP recorded 132/76 mmHg with Cynthia Barrera still "groggy" from anaesthesia for a cystoscopy this morning. I am also concerned about the PRN use of clonidine particularly in the evening hours - we may eventually do better with a BID schedule or not at all.     Problem List Items Addressed This Visit     HLD (hyperlipidemia)    Essential hypertension    Hypothyroidism    Vitamin D deficiency - Primary    Relevant Orders    VITAMIN D 25 HYDROXY    Hyperparathyroidism ([place])  Bobbe Medico, MD  10/04/2015, 12:32

## 2015-10-04 NOTE — Progress Notes (Signed)
[facility] MEDICINE & SPECIALTY OFFICE  [address] [place] [address]  [place]  Date of Service: 10/04/2015    Chief Complaint:   Chief Complaint   Patient presents with    Hypertension       History  HPI    Since last visit has been on CPAP x 3 weeks. With the full face mask, feels sleeping better waking [place] 1-2 x a night.     Since the last iron transfusion have hard stools and BM's q 3 days not use the lactulose since not able to swallow liquid medicine.    thinks she is feeling a little better then prior to CPAP.  Not have as many episodes of Afib since been on cpap    BP always higher in the clinic then at home    Review of Systems    Examination  Vitals: BP 150/84 mmHg   Pulse 57   Resp 20   Ht 1.626 m (5\' 4" )   Wt 85.367 kg (188 lb 3.2 oz)   BMI 32.29 kg/m2   SpO2 99%  Physical Exam   Constitutional: She appears well-developed and well-nourished.   HENT:   Head: Normocephalic and atraumatic.   Neck: Neck supple.   Cardiovascular: Normal rate, regular rhythm and normal heart sounds.    No murmur heard.  Pulmonary/Chest: Effort normal and breath sounds normal. She has no wheezes.   Abdominal: Soft. Bowel sounds are normal. There is no tenderness.   Vitals reviewed.    Ortho Exam    Results    Diagnosis and Plan  1. Iron deficiency anemia, unspecified iron deficiency anemia type    2. Fatigue, unspecified type    3. Paroxysmal atrial fibrillation (HCC)    4. OSA on CPAP    1. Iron deficiency anemia, unspecified iron deficiency anemia type  Recheck fer if in the 20 range then ok to stop the iron pill and monitor suspect the iron pill increasing the constipation.  Pt will use Miralax for now to help with the constipation.      Pt not use the lactulose due to liq meds make her gag but no trouble with swallowing liquid food (pt beleaves it is a head thing)  Senakot caused carping.  - FERRITIN; Future  - FERRITIN    2. Fatigue, unspecified type  Slowing improving  continue cpap    3. Paroxysmal atrial fibrillation (HCC)  Less since using CPAP continue to monitor    4. OSA on CPAP  Continue to encourage CPAP use.   Orders Placed This Encounter    FERRITIN       Darleen Crocker, DO

## 2015-10-12 ENCOUNTER — Other Ambulatory Visit (INDEPENDENT_AMBULATORY_CARE_PROVIDER_SITE_OTHER): Payer: Self-pay | Admitting: Endocrinology, Diabetes, & Metabolism

## 2015-10-12 DIAGNOSIS — E039 Hypothyroidism, unspecified: Secondary | ICD-10-CM

## 2015-12-01 ENCOUNTER — Other Ambulatory Visit (INDEPENDENT_AMBULATORY_CARE_PROVIDER_SITE_OTHER): Payer: Self-pay | Admitting: Internal Medicine

## 2015-12-01 DIAGNOSIS — I10 Essential (primary) hypertension: Secondary | ICD-10-CM

## 2015-12-07 ENCOUNTER — Other Ambulatory Visit (INDEPENDENT_AMBULATORY_CARE_PROVIDER_SITE_OTHER): Payer: Self-pay | Admitting: Internal Medicine

## 2015-12-07 DIAGNOSIS — I10 Essential (primary) hypertension: Secondary | ICD-10-CM

## 2016-01-07 ENCOUNTER — Encounter (INDEPENDENT_AMBULATORY_CARE_PROVIDER_SITE_OTHER): Payer: Self-pay | Admitting: Endocrinology, Diabetes, & Metabolism

## 2016-01-07 ENCOUNTER — Ambulatory Visit (INDEPENDENT_AMBULATORY_CARE_PROVIDER_SITE_OTHER): Payer: Medicare Other | Admitting: Endocrinology, Diabetes, & Metabolism

## 2016-01-07 VITALS — BP 220/94 | HR 76 | Ht 64.0 in | Wt 179.0 lb

## 2016-01-07 DIAGNOSIS — E785 Hyperlipidemia, unspecified: Secondary | ICD-10-CM

## 2016-01-07 DIAGNOSIS — I10 Essential (primary) hypertension: Secondary | ICD-10-CM

## 2016-01-07 DIAGNOSIS — E559 Vitamin D deficiency, unspecified: Secondary | ICD-10-CM

## 2016-01-07 DIAGNOSIS — E213 Hyperparathyroidism, unspecified: Secondary | ICD-10-CM

## 2016-01-07 DIAGNOSIS — E039 Hypothyroidism, unspecified: Principal | ICD-10-CM

## 2016-01-07 DIAGNOSIS — G4733 Obstructive sleep apnea (adult) (pediatric): Secondary | ICD-10-CM

## 2016-01-07 DIAGNOSIS — M810 Age-related osteoporosis without current pathological fracture: Secondary | ICD-10-CM | POA: Diagnosis not present

## 2016-01-07 DIAGNOSIS — M8588 Other specified disorders of bone density and structure, other site: Secondary | ICD-10-CM | POA: Diagnosis not present

## 2016-01-07 MED ORDER — HYDROCHLOROTHIAZIDE 12.5 MG CAPSULE
12.5000 mg | ORAL_CAPSULE | Freq: Every day | ORAL | 3 refills | Status: DC
Start: 2016-01-07 — End: 2016-07-10

## 2016-01-07 NOTE — Progress Notes (Signed)
[facility]  [address]  [place] [address]  [phone]        Patient name:  Cynthia Barrera  MRN:  [id number]  DOB:  [birth year]  DATE:  01/07/2016      Vitals:    01/07/16 1254   BP: (!) 220/94   Pulse: 76   Weight: 81.2 kg (179 lb)   Height: 1.626 m (5' 4")       1. Hypertension, BP 150/84 mmHg:   Since her previous visit, with medication adjustment, she has done considerably better with stable home BP monitoring. Skylie continues sotalol 80 mg BID, lisinopril 40 mg Qam and HCTz 25 mg daily. Note that clonidine and amlodipine remain in her medication list. On this regimen, BP control reviewed through her daily log has essentially returned to normal with only a few, transient exceptions (usually prior to bedtime). She continues follow-up with Dr. Delia Chimes with a pending exercise stress test. Atrial fibrillation diagnosed during a previous admission to [place] for bronchitis has not been a problem at the time of her last visit and sinus rhythm had been maintained until recently. Her current maintenance on Eliquis and ASA remains. The above improvement while noted has deteriorated since a fall while visiting [facility] with a scalp laceration and an ED visit in November 2016.  Home monitoring has been obtained as often as twice daily with the expected variation - since January 2017 Jyllian has noted higher pressures with systolics in the 062 mmHg range and diastolics > 90 mmHg interspersed with lower pressures at other times. Alexandrya continues clonidine 0.1 mg BID, lisinopril 40 mg daily and sotalol 80 mg BID but amlodipine 5 mg and HCTz 25 mg are no longer taken. The medication has also contributed to dryness of the mucous membranes as well as drowziness. We'll resume HCTz 12.5 mg Qam, monitor her BMP and consider changing to a ARB/CCB combination. The above BP is considerably higher than those recorded at home but will continue to be  monitored closely.  March 2016 labs included BUN 31, creatinine 1.5, eGFR 34 ml/min. Lipids were reported as cholesterol 167, triglycerides 139, HDL 42 and LDL 97.  2. Hypothyroid:   Synthroid was reduced from 100 mcg Qam to 75 mcg Qam based on [place] studies including TSH 0.474 mIU/mL (normal 0.370-4.420) and free thyroxine 2.10 ng/dL (normal 0.75-2.00). This is a considerably greater reduction than I would have recommended if a reduction was indicated at all. Since she has now taken the 75 mcg dosage for 8 weeks - the TSH obtained during the April hospitalization will suffice - TSH was normal at 2.13 uIU/mL. Further confirmation was obtained with TSH of 1.490 uIU/ml and free thyroxine 1.4 ng/dl from July 2016 through Dr Jonnie Finner office.  Thyroid examination was performed since Dr. Frederic Jericho felt there was a "change" - on exam, the prominence of both lobes was again noted with nodularity over the isthmic region appearing unchanged from previous exams - findings which were again confirmed. Thyroid ultrasound will be considered on return but deferred due to current cardiac evaluation.  3. Hyperparathyroid, stable:  Serum Ca++ 10.2 mg/dl, status post renal calculi last year with iPTH in July 2016 of 159 pg/ml and vitamin D of 19.2 ng/mL. The ionized calcium was 1.43 mmol/L (normal 1.12-1.32). With prior history of renal calculi, osteoporosis and evident chronic renal disease, Tomekia is a surgical candidate for all intents and purposes but to date  has not elected this avenue.  4. Plaquenil toxicity:   Obera notes no significant change in visual problems but feels that there may be some worsening. She is scheduled to return for follow-up at [facility] with her next appointment in October 2015 with follow-up pending.  5. ? Glucose intolerance, random glucose 129 mg/dl   The study was on a non-fasting sample but raises minor concerns due to associated risk factors. Rachael will monitor fasting and 2h  post-prandial glucoses at home. The glucose log was reported anecdotally and considered "normal" by the patient. We'll continue weekly home monitoring and recording noting that HbA1c was reported as 5.8% and BMP was essentially normal with a random glucose of 89 mg/dl.  6. Osteoporosis:  DEXA scan obtained recently with less than an ideal interpretation but was consistent with osteoporosis with a T score of -2.8 in the left hip area. She also had follow-up mammography with additional follow-up pending due to "grey" area in the left breast. Dr. Frederic Jericho suggested she discuss "supplements" with me during this visit. Vitamin D levels should be measured but I seriously doubt that calcium supplement is indicated due to established hyperparathyroidism and a recent history of renal calculi. A repeat serum calcium was reported as 10.2 mg/dl.  7. Renal calculi, secondary to hyperparathyroidism:  Diantha was scheduled to undergo urethral dilatation as well as lithotripsy with antiplatelet therapy to be discussed with Dr. Georg Ruddle office.  8. Vitamin B12 deficiency:  Armani was recently started on oral B12 therapy after an elevated MMA level was obtained - improvement was evident on a repeat study therefore oral replacement will continue.  9. Anemia, iron deficiency:  Studies through Dr. Candiss Norse in March 2016 revealed serum iron 48, % saturation 12% and ferritin 22.6 (8.0-252) with Hgb 9.3 gm/dl and Hct 30.1%. Since then she has had two iron infusions but she almost "passed out" after the last infusion - she recovered without in approximately one hour with no additional problems.  10. Sleep apnea, with full mask tolerated:  Erik has been using C-Pap for the last three months with significant improvement in sleep patterns  Comment:  Folashade's daughter recently was diagnosed with breast cancer and has gone through chemotherapy to date with a good response to date. This has been another source of "stress" but appears to be handled  well although additional concern was raised recently when she was seen through [facility] with agreement only on the choice of chemotherapy.  Over the years we have demonstrated numerous circumstances of what can only be described as "white coat hypertension". The blood pressure elevation is transient and situational with resolution within 15-30 minutes. This was gain demonstrated this afternoon when the initial pressure of 220/94 mmHg decreased after only ten minutes to 205/86 mmHg. There are continued stresses that cna likely contribute to the elevation including her daughter's health status with additional surgery recently. On the other hand, Gailene's new massage chair received at Christmas as well as her renewal of C-pap should help stabilize the rather mercurial BP swings.    Problem List Items Addressed This Visit     HLD (hyperlipidemia)    Essential hypertension    Relevant Medications    hydroCHLOROthiazide (MICROZIDE) 12.5 mg Oral Capsule    Other Relevant Orders    BASIC METABOLIC PANEL    Hypothyroidism - Primary    Vitamin D deficiency    Hyperparathyroidism (HCC)    OSA (obstructive sleep apnea)  Bobbe Medico, MD  01/07/2016, 13:05

## 2016-02-06 DIAGNOSIS — E782 Mixed hyperlipidemia: Secondary | ICD-10-CM | POA: Diagnosis not present

## 2016-02-06 DIAGNOSIS — M810 Age-related osteoporosis without current pathological fracture: Secondary | ICD-10-CM | POA: Diagnosis not present

## 2016-02-06 DIAGNOSIS — I10 Essential (primary) hypertension: Secondary | ICD-10-CM | POA: Diagnosis not present

## 2016-02-06 DIAGNOSIS — R5383 Other fatigue: Secondary | ICD-10-CM | POA: Diagnosis not present

## 2016-02-06 DIAGNOSIS — I480 Paroxysmal atrial fibrillation: Secondary | ICD-10-CM | POA: Diagnosis not present

## 2016-02-19 DIAGNOSIS — E039 Hypothyroidism, unspecified: Secondary | ICD-10-CM | POA: Diagnosis not present

## 2016-02-19 DIAGNOSIS — R5383 Other fatigue: Secondary | ICD-10-CM | POA: Diagnosis not present

## 2016-02-19 DIAGNOSIS — I480 Paroxysmal atrial fibrillation: Secondary | ICD-10-CM | POA: Diagnosis not present

## 2016-02-19 DIAGNOSIS — G4733 Obstructive sleep apnea (adult) (pediatric): Secondary | ICD-10-CM | POA: Diagnosis not present

## 2016-02-19 DIAGNOSIS — M069 Rheumatoid arthritis, unspecified: Secondary | ICD-10-CM | POA: Diagnosis not present

## 2016-02-19 DIAGNOSIS — D649 Anemia, unspecified: Secondary | ICD-10-CM | POA: Diagnosis not present

## 2016-02-19 DIAGNOSIS — R5382 Chronic fatigue, unspecified: Secondary | ICD-10-CM | POA: Diagnosis not present

## 2016-02-19 DIAGNOSIS — Z87442 Personal history of urinary calculi: Secondary | ICD-10-CM | POA: Diagnosis not present

## 2016-02-19 DIAGNOSIS — I10 Essential (primary) hypertension: Secondary | ICD-10-CM | POA: Diagnosis not present

## 2016-02-19 DIAGNOSIS — Z7901 Long term (current) use of anticoagulants: Secondary | ICD-10-CM | POA: Diagnosis not present

## 2016-02-19 DIAGNOSIS — R001 Bradycardia, unspecified: Secondary | ICD-10-CM | POA: Diagnosis not present

## 2016-02-19 DIAGNOSIS — Z9981 Dependence on supplemental oxygen: Secondary | ICD-10-CM | POA: Diagnosis not present

## 2016-02-20 DIAGNOSIS — Z7901 Long term (current) use of anticoagulants: Secondary | ICD-10-CM | POA: Diagnosis not present

## 2016-02-20 DIAGNOSIS — E039 Hypothyroidism, unspecified: Secondary | ICD-10-CM | POA: Diagnosis not present

## 2016-02-20 DIAGNOSIS — I10 Essential (primary) hypertension: Secondary | ICD-10-CM | POA: Diagnosis not present

## 2016-02-20 DIAGNOSIS — Z87442 Personal history of urinary calculi: Secondary | ICD-10-CM | POA: Diagnosis not present

## 2016-02-20 DIAGNOSIS — I480 Paroxysmal atrial fibrillation: Secondary | ICD-10-CM | POA: Diagnosis not present

## 2016-02-20 DIAGNOSIS — R001 Bradycardia, unspecified: Secondary | ICD-10-CM | POA: Diagnosis not present

## 2016-02-20 DIAGNOSIS — R5383 Other fatigue: Secondary | ICD-10-CM | POA: Diagnosis not present

## 2016-02-20 DIAGNOSIS — R5382 Chronic fatigue, unspecified: Secondary | ICD-10-CM | POA: Diagnosis not present

## 2016-02-20 DIAGNOSIS — M069 Rheumatoid arthritis, unspecified: Secondary | ICD-10-CM | POA: Diagnosis not present

## 2016-02-20 DIAGNOSIS — Z9981 Dependence on supplemental oxygen: Secondary | ICD-10-CM | POA: Diagnosis not present

## 2016-02-20 DIAGNOSIS — G4733 Obstructive sleep apnea (adult) (pediatric): Secondary | ICD-10-CM | POA: Diagnosis not present

## 2016-02-20 DIAGNOSIS — D649 Anemia, unspecified: Secondary | ICD-10-CM | POA: Diagnosis not present

## 2016-02-22 DIAGNOSIS — I480 Paroxysmal atrial fibrillation: Secondary | ICD-10-CM | POA: Diagnosis not present

## 2016-03-12 DIAGNOSIS — I10 Essential (primary) hypertension: Secondary | ICD-10-CM | POA: Diagnosis not present

## 2016-03-12 DIAGNOSIS — R944 Abnormal results of kidney function studies: Secondary | ICD-10-CM | POA: Diagnosis not present

## 2016-03-12 DIAGNOSIS — M810 Age-related osteoporosis without current pathological fracture: Secondary | ICD-10-CM | POA: Diagnosis not present

## 2016-03-18 DIAGNOSIS — R0602 Shortness of breath: Secondary | ICD-10-CM | POA: Diagnosis not present

## 2016-03-18 DIAGNOSIS — Z7901 Long term (current) use of anticoagulants: Secondary | ICD-10-CM | POA: Diagnosis not present

## 2016-03-18 DIAGNOSIS — G4733 Obstructive sleep apnea (adult) (pediatric): Secondary | ICD-10-CM | POA: Diagnosis not present

## 2016-03-18 DIAGNOSIS — J984 Other disorders of lung: Secondary | ICD-10-CM | POA: Diagnosis not present

## 2016-03-19 ENCOUNTER — Other Ambulatory Visit (INDEPENDENT_AMBULATORY_CARE_PROVIDER_SITE_OTHER): Payer: Self-pay | Admitting: Internal Medicine

## 2016-03-19 DIAGNOSIS — F419 Anxiety disorder, unspecified: Secondary | ICD-10-CM

## 2016-03-19 DIAGNOSIS — R5383 Other fatigue: Secondary | ICD-10-CM

## 2016-03-19 NOTE — Telephone Encounter (Signed)
I did not approve hydroxyzine- there is potential interaction w/ hydroxyzine and amiodarone  (QTc prolongation) - patient on hydroxyzine w/ betapace prior -   Has appt next week w/ dr Candiss Norse  Advised try benadryl 25 mg for sleep prn. (no interaction)  Consider EKG -    Recent hospital stay that changed betapace to amiodarone

## 2016-03-22 DIAGNOSIS — I480 Paroxysmal atrial fibrillation: Secondary | ICD-10-CM | POA: Diagnosis not present

## 2016-03-26 ENCOUNTER — Other Ambulatory Visit (INDEPENDENT_AMBULATORY_CARE_PROVIDER_SITE_OTHER): Payer: Self-pay | Admitting: Internal Medicine

## 2016-03-26 ENCOUNTER — Other Ambulatory Visit (INDEPENDENT_AMBULATORY_CARE_PROVIDER_SITE_OTHER): Payer: Self-pay | Admitting: Endocrinology, Diabetes, & Metabolism

## 2016-03-26 DIAGNOSIS — I10 Essential (primary) hypertension: Secondary | ICD-10-CM

## 2016-03-26 DIAGNOSIS — D509 Iron deficiency anemia, unspecified: Secondary | ICD-10-CM

## 2016-03-26 DIAGNOSIS — N281 Cyst of kidney, acquired: Secondary | ICD-10-CM | POA: Diagnosis not present

## 2016-03-26 DIAGNOSIS — R944 Abnormal results of kidney function studies: Secondary | ICD-10-CM | POA: Diagnosis not present

## 2016-03-26 MED ORDER — POLYSACCHARIDE IRON COMPLEX 150 MG IRON CAPSULE
ORAL_CAPSULE | ORAL | 3 refills | Status: DC
Start: 2016-03-26 — End: 2017-03-16

## 2016-03-26 MED ORDER — CLONIDINE HCL 0.1 MG TABLET
0.1000 mg | ORAL_TABLET | Freq: Two times a day (BID) | ORAL | 3 refills | Status: DC
Start: 2016-03-26 — End: 2016-10-15

## 2016-03-27 ENCOUNTER — Ambulatory Visit (INDEPENDENT_AMBULATORY_CARE_PROVIDER_SITE_OTHER): Payer: Medicare Other | Admitting: Internal Medicine

## 2016-03-27 ENCOUNTER — Encounter (INDEPENDENT_AMBULATORY_CARE_PROVIDER_SITE_OTHER): Payer: Medicare Other | Admitting: Internal Medicine

## 2016-03-27 ENCOUNTER — Encounter (INDEPENDENT_AMBULATORY_CARE_PROVIDER_SITE_OTHER): Payer: Self-pay | Admitting: Internal Medicine

## 2016-03-27 VITALS — BP 134/72 | HR 90 | Ht 64.0 in | Wt 173.2 lb

## 2016-03-27 DIAGNOSIS — I480 Paroxysmal atrial fibrillation: Secondary | ICD-10-CM

## 2016-03-27 DIAGNOSIS — D509 Iron deficiency anemia, unspecified: Secondary | ICD-10-CM

## 2016-03-27 DIAGNOSIS — E039 Hypothyroidism, unspecified: Secondary | ICD-10-CM

## 2016-03-27 DIAGNOSIS — G4700 Insomnia, unspecified: Secondary | ICD-10-CM

## 2016-03-27 DIAGNOSIS — F419 Anxiety disorder, unspecified: Secondary | ICD-10-CM

## 2016-03-27 DIAGNOSIS — I10 Essential (primary) hypertension: Secondary | ICD-10-CM

## 2016-03-27 MED ORDER — TRAZODONE 50 MG TABLET
25.0000 mg | ORAL_TABLET | Freq: Every evening | ORAL | 5 refills | Status: DC
Start: 2016-03-27 — End: 2016-06-26

## 2016-03-27 NOTE — Progress Notes (Signed)
[facility] OFFICE  [address] Cynthia Barrera [place] [address]  [place]  Date of Service: 03/27/2016    Chief Complaint:   Chief Complaint   Patient presents with    Medication Update       History  HPI    Continued to take the iron and constipation resolved.    Energy still low even though on the CPAP    Change from sotalol to amiodarone about 5 weeks ago due to heart rate in the 50's energy improved for a little bit then decreased when decreased when decrease to 1 pill of amino a day and heart rate increase to low 90-100.  Finished heart monitor last Friday did it for about 29 days results pending. To see dr Delia Chimes on monday    Was placed on norvas about 2 weeks ago due to BP bening high.    daughter one sick in icu (daugter in law)in sepsis and other with breat ca undergoing tx    Unable to sleep due to above and mind racing can not take topamax due to hx of renal stone.  Unable to sleep due to stressors and above        Review of Systems    Examination  Vitals: BP 134/72   Pulse 90   Ht 1.626 m (5\' 4" )   Wt 78.6 kg (173 lb 3.2 oz)   BMI 29.73 kg/m2  Physical Exam   Constitutional: She appears well-developed and well-nourished.   Neck: Neck supple.   Cardiovascular: Normal rate.    irr irr rhythm   Pulmonary/Chest: Effort normal and breath sounds normal. She has no wheezes.   Abdominal: Soft. Bowel sounds are normal. She exhibits no mass.   Musculoskeletal: She exhibits no edema.   Vitals reviewed.    Ortho Exam    Results    Diagnosis and Plan  1. Essential hypertension    2. Anxiety    3. Insomnia, unspecified type    4. IDA (iron deficiency anemia)    5. Hypothyroidism, unspecified type    6. Paroxysmal atrial fibrillation (HCC)    1. Essential hypertension  BP controlled     2. Anxiety  Add trazodone low dose and titrate to effect will help with both anxiety and insomnia suspect will need to agument with seroquel type of agent but hold off until further  clarify Afib meds  - traZODone (DESYREL) 50 mg Oral Tablet; Take 0.5 Tabs (25 mg total) by mouth Every night  Dispense: 30 Tab; Refill: 5  - THYROID STIMULATING HORMONE (SENSITIVE TSH); Future  - Free T4; Future  - THYROID STIMULATING HORMONE (SENSITIVE TSH)  - Free T4    3. Insomnia, unspecified type  See above  - traZODone (DESYREL) 50 mg Oral Tablet; Take 0.5 Tabs (25 mg total) by mouth Every night  Dispense: 30 Tab; Refill: 5  - THYROID STIMULATING HORMONE (SENSITIVE TSH); Future  - Free T4; Future  - THYROID STIMULATING HORMONE (SENSITIVE TSH)  - Free T4    4. IDA (iron deficiency anemia)  Recheck fer to see if replaced  - FERRITIN; Future  - FERRITIN    5. Hypothyroidism, unspecified type  Recheck TSH and free t4 since on amnio now which can affect these levels   - THYROID STIMULATING HORMONE (SENSITIVE TSH); Future  - Free T4; Future  - THYROID STIMULATING HORMONE (SENSITIVE TSH)  - Free T4    6.  Paroxysmal atrial fibrillation ([place])  Await f/u with cards but if hr stays in th 90-100 then will consider adding in coreg to help with hr and BP  Suspect pt will do the best with hr on the 70-80 at rest.  Orders Placed This Encounter    FERRITIN    THYROID STIMULATING HORMONE (SENSITIVE TSH)    Free T4    traZODone (DESYREL) 50 mg Oral Tablet       Darleen Crocker, DO

## 2016-03-31 DIAGNOSIS — I480 Paroxysmal atrial fibrillation: Secondary | ICD-10-CM | POA: Diagnosis not present

## 2016-03-31 DIAGNOSIS — R5383 Other fatigue: Secondary | ICD-10-CM | POA: Diagnosis not present

## 2016-03-31 DIAGNOSIS — I10 Essential (primary) hypertension: Secondary | ICD-10-CM | POA: Diagnosis not present

## 2016-03-31 DIAGNOSIS — Z1389 Encounter for screening for other disorder: Secondary | ICD-10-CM | POA: Diagnosis not present

## 2016-03-31 LAB — THYROXINE, FREE (FREE T4): THYROXINE, FREE (FREE T4): 1.6 ng/dL (ref 0.9–1.8)

## 2016-03-31 LAB — THYROID STIMULATING HORMONE (SENSITIVE TSH): TSH: 1.18 mcIU/mL (ref 0.350–5.550)

## 2016-04-01 ENCOUNTER — Telehealth (INDEPENDENT_AMBULATORY_CARE_PROVIDER_SITE_OTHER): Payer: Self-pay | Admitting: Internal Medicine

## 2016-04-01 NOTE — Telephone Encounter (Signed)
TSH and free t4 WNL  Fer improved to 16 from 7.5 previously continue on iron replacement . But if having difficulty with taking iron then can set her up for venifer infusion.    Pt not home left message of above.

## 2016-04-03 ENCOUNTER — Encounter (INDEPENDENT_AMBULATORY_CARE_PROVIDER_SITE_OTHER): Payer: Medicare Other | Admitting: Internal Medicine

## 2016-04-03 ENCOUNTER — Encounter (INDEPENDENT_AMBULATORY_CARE_PROVIDER_SITE_OTHER): Payer: Self-pay

## 2016-04-09 ENCOUNTER — Encounter (INDEPENDENT_AMBULATORY_CARE_PROVIDER_SITE_OTHER): Payer: Medicare Other | Admitting: Endocrinology, Diabetes, & Metabolism

## 2016-04-09 DIAGNOSIS — R944 Abnormal results of kidney function studies: Secondary | ICD-10-CM | POA: Diagnosis not present

## 2016-04-09 DIAGNOSIS — I10 Essential (primary) hypertension: Secondary | ICD-10-CM | POA: Diagnosis not present

## 2016-04-09 DIAGNOSIS — M810 Age-related osteoporosis without current pathological fracture: Secondary | ICD-10-CM | POA: Diagnosis not present

## 2016-04-09 DIAGNOSIS — I701 Atherosclerosis of renal artery: Secondary | ICD-10-CM | POA: Diagnosis not present

## 2016-04-10 ENCOUNTER — Ambulatory Visit (INDEPENDENT_AMBULATORY_CARE_PROVIDER_SITE_OTHER): Payer: Medicare Other | Admitting: Endocrinology, Diabetes, & Metabolism

## 2016-04-10 ENCOUNTER — Encounter (INDEPENDENT_AMBULATORY_CARE_PROVIDER_SITE_OTHER): Payer: Self-pay | Admitting: Endocrinology, Diabetes, & Metabolism

## 2016-04-10 VITALS — BP 138/72 | HR 75 | Resp 20 | Ht 64.0 in | Wt 177.6 lb

## 2016-04-10 DIAGNOSIS — D509 Iron deficiency anemia, unspecified: Secondary | ICD-10-CM

## 2016-04-10 NOTE — Progress Notes (Signed)
[facility] [facility]  [address]  [place] [address]  [phone]        Patient name:  Cynthia Barrera  MRN:  [id number]  DOB:  [birth year]  DATE:  04/10/2016      Vitals:    04/10/16 1345   BP: 138/72   Pulse: 75   Resp: 20   SpO2: 96%   Weight: 80.6 kg (177 lb 9.6 oz)   Height: 1.626 m (_0 )       1. Hypertension, BP 138/72 mmHg:   Since her previous visit, with medication adjustment, she has done considerably better with stable home BP monitoring. Cynthia Barrera discontinued sotalol 80 mg BID (through Dr. Georg Ruddle office), lisinopril 40 mg Qam, Tenormin 12.5 and HCTz 25 mg daily. Note that clonidine and amlodipine remain in her medication list. On this regimen, BP control reviewed through her daily log has essentially returned to normal with only a few, transient exceptions (usually prior to bedtime). She continues follow-up with Dr. Delia Chimes with a pending exercise stress test. Atrial fibrillation diagnosed during a previous admission to [facility] for bronchitis has not been a problem at the time of her last visit and sinus rhythm had been maintained until recently. Her current maintenance on Eliquis and ASA remains.   Home monitoring has been obtained as often as twice daily with the expected variation - since January 2017 Cynthia Barrera has noted higher pressures with systolics in the 865 mmHg range and diastolics > 90 mmHg interspersed with lower pressures at other times. The medication has also contributed to dryness of the mucous membranes as well as drowziness. We'll resume HCTz 12.5 mg Qam, monitor her BMP and consider changing to a ARB/CCB combination. May 2017 labs included BUN 26 (31), creatinine 1.3 (1.5), eGFR 40 (34) ml/min. Lipids were reported as cholesterol 167, triglycerides 139, HDL 42 and LDL 97.  2. Hypothyroid:   Synthroid was reduced from 100 mcg Qam to 75 mcg Qam based on [place] studies including TSH 0.474 mIU/mL (normal  0.370-4.420) and free thyroxine 2.10 ng/dL (normal 0.75-2.00). This is a considerably greater reduction than I would have recommended if a reduction was indicated at all. Since she has now taken the 75 mcg dosage for 8 weeks - the TSH obtained during the April hospitalization will suffice - TSH was normal at 2.13 uIU/mL. Further confirmation was obtained with the May 2017 labs with TSH of 1.180 uIU/ml and free thyroxine 1.6 ng/dl.   Thyroid examination was performed since Dr. Frederic Jericho felt there was a "change" - on exam, the prominence of both lobes was again noted with nodularity over the isthmic region appearing unchanged from previous exams - findings which were again confirmed. Thyroid ultrasound will be considered on return but deferred due to current cardiac evaluation.  3. Hyperparathyroid, stable:  Serum Ca++ 10.2-10.6 mg/dl, status post renal calculi last year with iPTH in July 2016 of 159 pg/ml and vitamin D of 19.2 ng/mL. The ionized calcium was 1.43 mmol/L (normal 1.12-1.32). With prior history of renal calculi, osteoporosis and evident chronic renal disease, Cynthia Barrera is a surgical candidate for all intents and purposes but to date has not elected this avenue.  4. Plaquenil toxicity:   Cynthia Barrera notes no significant change in visual problems but feels that there may be some worsening. She is scheduled to return for follow-up at [facility] with her next appointment in October 2015 with follow-up pending.  5. ? Glucose intolerance, random  glucose 129 mg/dl   The study was on a non-fasting sample but raises minor concerns due to associated risk factors. Cynthia Barrera will monitor fasting and 2h post-prandial glucoses at home. The glucose log was reported anecdotally and considered "normal" by the patient. We'll continue weekly home monitoring and recording noting that HbA1c was reported as 5.8% and BMP was essentially normal with a random glucose of 89 mg/dl.  6. Osteoporosis:  DEXA scan obtained recently with less  than an ideal interpretation but was consistent with osteoporosis with a T score of -2.8 in the left hip area. She also had follow-up mammography with additional follow-up pending due to "grey" area in the left breast. Dr. Frederic Jericho suggested she discuss "supplements" with me during this visit. Vitamin D levels should be measured but I seriously doubt that calcium supplement is indicated due to established hyperparathyroidism and a recent history of renal calculi. A repeat serum calcium was reported as 10.6 mg/dl.  7. Renal calculi, secondary to hyperparathyroidism:  Cynthia Barrera was scheduled to undergo urethral dilatation as well as lithotripsy with antiplatelet therapy (Eliquis) to be discussed with Dr. Georg Ruddle office.  8. Vitamin B12 deficiency:  Cynthia Barrera was recently started on oral B12 therapy after an elevated MMA level was obtained - improvement was evident on a repeat study therefore oral replacement will continue.  9. Anemia, iron deficiency:  Studies through Dr. Candiss Norse in March 2016 revealed serum iron 48, % saturation 12% and ferritin 22.6 (8.0-252) with Hgb 9.3 gm/dl and Hct 30.1%. Since then she has had two iron infusions but she almost "passed out" after the last infusion - she recovered without in approximately one hour with no additional problems.  10. Sleep apnea, with full mask tolerated:  Cynthia Barrera has been using C-Pap for the last three months with significant improvement in sleep patterns  11. Paroxysmal atrial fibrillation, amiodarone started:  Cynthia Barrera's previous stability on sotalol 80 mg BID was flawed after "breakthrough" tachycardia occurred - she was seen through Dr. Normand Sloop with sotalol stopped and amiodarone 200 mg daily instituted - with established, underlying thyroid disease, monitoring will be maintained on a consistent every 3 months basis.  Comment:  Cynthia Barrera's daughter recently was diagnosed with breast cancer and has gone through chemotherapy to date with a good response to date. This has been  another source of "stress" but appears to be handled well although additional concern was raised recently when she was seen through [facility] with agreement only on the choice of chemotherapy.  Over the years we have demonstrated numerous circumstances of what can only be described as "white coat hypertension". The blood pressure elevation is transient and situational with resolution within 15-30 minutes. This was gain demonstrated this afternoon when the initial pressure of 220/94 mmHg decreased after only ten minutes to 205/86 mmHg. There are continued stresses that cna likely contribute to the elevation including her daughter's health status with additional surgery recently. On the other hand, Cynthia Barrera's new massage chair received at Christmas as well as her renewal of C-pap should help stabilize the rather mercurial BP swings.    Problem List Items Addressed This Visit     None          Bobbe Medico, MD  04/10/2016, 13:44

## 2016-04-16 DIAGNOSIS — M810 Age-related osteoporosis without current pathological fracture: Secondary | ICD-10-CM | POA: Diagnosis not present

## 2016-04-16 DIAGNOSIS — D509 Iron deficiency anemia, unspecified: Secondary | ICD-10-CM | POA: Diagnosis not present

## 2016-04-16 DIAGNOSIS — R944 Abnormal results of kidney function studies: Secondary | ICD-10-CM | POA: Diagnosis not present

## 2016-04-16 DIAGNOSIS — I10 Essential (primary) hypertension: Secondary | ICD-10-CM | POA: Diagnosis not present

## 2016-04-16 LAB — CBC/DIFF
ABSOLUTE NEUTROPHIL COUNT: 5.1 x10 (ref 2.40–8.42)
BASOPHILS: 0.4 % (ref 0.0–2.0)
BASOS ABS: 0 x10 (ref 0.00–0.22)
EOS ABS: 0.2 x10 (ref 0.00–0.43)
EOSINOPHIL: 2.9 % (ref 0.0–4.0)
HCT: 32 % — ABNORMAL LOW (ref 36.0–47.0)
HGB: 10.9 g/dL — ABNORMAL LOW (ref 12.0–15.0)
LYMPHOCYTES: 12.9 % — ABNORMAL LOW (ref 20.0–35.0)
LYMPHS ABS: 0.9 x10 — ABNORMAL LOW (ref 0.96–3.78)
MCH: 28.5 pg (ref 27.0–32.0)
MCHC: 33.9 g/dL (ref 32.0–36.0)
MCV: 84.1 fL (ref 80.0–100.0)
MONOCYTES: 13 % — ABNORMAL HIGH (ref 0.0–8.0)
MONOS ABS: 0.9 x10 — ABNORMAL HIGH (ref 0.00–0.86)
MPV: 8.9 fL (ref 6.6–9.3)
PLATELET COUNT: 240 x10 (ref 140–450)
PMN'S: 70.8 % (ref 50.0–78.0)
RBC: 3.81 x10 — ABNORMAL LOW (ref 4.20–5.40)
RDW: 16.2 % — ABNORMAL HIGH (ref 12.0–15.0)
WBC: 7.2 x10 (ref 4.8–10.8)

## 2016-04-30 DIAGNOSIS — N183 Chronic kidney disease, stage 3 (moderate): Secondary | ICD-10-CM | POA: Diagnosis not present

## 2016-04-30 DIAGNOSIS — I129 Hypertensive chronic kidney disease with stage 1 through stage 4 chronic kidney disease, or unspecified chronic kidney disease: Secondary | ICD-10-CM | POA: Diagnosis not present

## 2016-04-30 DIAGNOSIS — N281 Cyst of kidney, acquired: Secondary | ICD-10-CM | POA: Diagnosis not present

## 2016-05-08 DIAGNOSIS — R944 Abnormal results of kidney function studies: Secondary | ICD-10-CM | POA: Diagnosis not present

## 2016-05-08 DIAGNOSIS — I129 Hypertensive chronic kidney disease with stage 1 through stage 4 chronic kidney disease, or unspecified chronic kidney disease: Secondary | ICD-10-CM | POA: Diagnosis not present

## 2016-05-08 DIAGNOSIS — N281 Cyst of kidney, acquired: Secondary | ICD-10-CM | POA: Diagnosis not present

## 2016-05-08 DIAGNOSIS — N183 Chronic kidney disease, stage 3 (moderate): Secondary | ICD-10-CM | POA: Diagnosis not present

## 2016-05-08 DIAGNOSIS — M810 Age-related osteoporosis without current pathological fracture: Secondary | ICD-10-CM | POA: Diagnosis not present

## 2016-05-11 DIAGNOSIS — S01411A Laceration without foreign body of right cheek and temporomandibular area, initial encounter: Secondary | ICD-10-CM | POA: Diagnosis not present

## 2016-05-11 DIAGNOSIS — Z043 Encounter for examination and observation following other accident: Secondary | ICD-10-CM | POA: Diagnosis not present

## 2016-05-11 DIAGNOSIS — Z23 Encounter for immunization: Secondary | ICD-10-CM | POA: Diagnosis not present

## 2016-05-11 DIAGNOSIS — Z7901 Long term (current) use of anticoagulants: Secondary | ICD-10-CM | POA: Diagnosis not present

## 2016-05-11 DIAGNOSIS — S0031XA Abrasion of nose, initial encounter: Secondary | ICD-10-CM | POA: Diagnosis not present

## 2016-05-11 DIAGNOSIS — S0181XA Laceration without foreign body of other part of head, initial encounter: Secondary | ICD-10-CM | POA: Diagnosis not present

## 2016-05-22 ENCOUNTER — Encounter (INDEPENDENT_AMBULATORY_CARE_PROVIDER_SITE_OTHER): Payer: Medicare Other | Admitting: Internal Medicine

## 2016-05-26 DIAGNOSIS — H40033 Anatomical narrow angle, bilateral: Secondary | ICD-10-CM | POA: Diagnosis not present

## 2016-05-26 DIAGNOSIS — H16223 Keratoconjunctivitis sicca, not specified as Sjogren's, bilateral: Secondary | ICD-10-CM | POA: Diagnosis not present

## 2016-05-26 DIAGNOSIS — H35383 Toxic maculopathy, bilateral: Secondary | ICD-10-CM | POA: Diagnosis not present

## 2016-05-26 DIAGNOSIS — D3132 Benign neoplasm of left choroid: Secondary | ICD-10-CM | POA: Diagnosis not present

## 2016-05-26 DIAGNOSIS — H25813 Combined forms of age-related cataract, bilateral: Secondary | ICD-10-CM | POA: Diagnosis not present

## 2016-06-05 DIAGNOSIS — N281 Cyst of kidney, acquired: Secondary | ICD-10-CM | POA: Diagnosis not present

## 2016-06-05 DIAGNOSIS — N183 Chronic kidney disease, stage 3 (moderate): Secondary | ICD-10-CM | POA: Diagnosis not present

## 2016-06-05 DIAGNOSIS — I129 Hypertensive chronic kidney disease with stage 1 through stage 4 chronic kidney disease, or unspecified chronic kidney disease: Secondary | ICD-10-CM | POA: Diagnosis not present

## 2016-06-10 ENCOUNTER — Other Ambulatory Visit (INDEPENDENT_AMBULATORY_CARE_PROVIDER_SITE_OTHER): Payer: Self-pay | Admitting: Internal Medicine

## 2016-06-10 NOTE — Telephone Encounter (Signed)
Opened in error

## 2016-06-11 ENCOUNTER — Emergency Department (HOSPITAL_COMMUNITY): Payer: Self-pay

## 2016-06-11 DIAGNOSIS — E860 Dehydration: Secondary | ICD-10-CM | POA: Diagnosis not present

## 2016-06-11 DIAGNOSIS — B9789 Other viral agents as the cause of diseases classified elsewhere: Secondary | ICD-10-CM | POA: Diagnosis not present

## 2016-06-11 DIAGNOSIS — N289 Disorder of kidney and ureter, unspecified: Secondary | ICD-10-CM | POA: Diagnosis not present

## 2016-06-11 DIAGNOSIS — Z7901 Long term (current) use of anticoagulants: Secondary | ICD-10-CM | POA: Diagnosis not present

## 2016-06-11 DIAGNOSIS — J069 Acute upper respiratory infection, unspecified: Secondary | ICD-10-CM | POA: Diagnosis not present

## 2016-06-11 DIAGNOSIS — R531 Weakness: Secondary | ICD-10-CM | POA: Diagnosis not present

## 2016-06-11 DIAGNOSIS — I10 Essential (primary) hypertension: Secondary | ICD-10-CM | POA: Diagnosis not present

## 2016-06-11 DIAGNOSIS — I4891 Unspecified atrial fibrillation: Secondary | ICD-10-CM | POA: Diagnosis not present

## 2016-06-11 DIAGNOSIS — J929 Pleural plaque without asbestos: Secondary | ICD-10-CM | POA: Diagnosis not present

## 2016-06-13 ENCOUNTER — Encounter (INDEPENDENT_AMBULATORY_CARE_PROVIDER_SITE_OTHER): Payer: Self-pay | Admitting: Internal Medicine

## 2016-06-14 DIAGNOSIS — J019 Acute sinusitis, unspecified: Secondary | ICD-10-CM | POA: Diagnosis not present

## 2016-06-19 ENCOUNTER — Encounter (INDEPENDENT_AMBULATORY_CARE_PROVIDER_SITE_OTHER): Payer: Self-pay | Admitting: Internal Medicine

## 2016-06-26 ENCOUNTER — Ambulatory Visit (INDEPENDENT_AMBULATORY_CARE_PROVIDER_SITE_OTHER): Payer: Medicare Other | Admitting: Internal Medicine

## 2016-06-26 ENCOUNTER — Encounter (INDEPENDENT_AMBULATORY_CARE_PROVIDER_SITE_OTHER): Payer: Self-pay | Admitting: Internal Medicine

## 2016-06-26 VITALS — BP 138/70 | HR 60 | Resp 20 | Ht 64.5 in | Wt 178.4 lb

## 2016-06-26 DIAGNOSIS — F419 Anxiety disorder, unspecified: Principal | ICD-10-CM

## 2016-06-26 DIAGNOSIS — N179 Acute kidney failure, unspecified: Secondary | ICD-10-CM

## 2016-06-26 DIAGNOSIS — G4700 Insomnia, unspecified: Secondary | ICD-10-CM

## 2016-06-26 MED ORDER — TRAZODONE 50 MG TABLET
50.0000 mg | ORAL_TABLET | Freq: Every evening | ORAL | 5 refills | Status: DC
Start: 2016-06-26 — End: 2016-12-04

## 2016-06-26 NOTE — Progress Notes (Signed)
[place] OFFICE  [address] [place] [address]  [place]  Date of Service: 06/26/2016    Chief Complaint:   Chief Complaint   Patient presents with    Fatigue       History  HPI  Has been taking the trazodone 50 1/2 pill post last visit still getting up 2-3 x a night but better then getting up every 2 hrs. Wake up ok but get fatigue in the afternoon for the past 1 month.    Fell at church end of June due to missed the last step and had to get stiches.    Two weeks later cough and increased fatigue went to er [place] given fluids but no antibiotic went to clinic about 2 weeks ago and got antibiotics  And starting to feel better yesterday.      Saw nephro last month and placed her on hydralazine due to BP being elevated.  Review of Systems    Examination  Vitals: BP 138/70   Pulse 60   Resp 20   Ht 1.638 m (5' 4.5")   Wt 80.9 kg (178 lb 6.4 oz)   LMP  (Exact Date)   SpO2 94%   Breastfeeding? No   BMI 30.15 kg/m2  Physical Exam   Constitutional: She appears well-developed and well-nourished.   HENT:   Head: Normocephalic and atraumatic.   Neck: Neck supple.   Cardiovascular: Normal rate, regular rhythm and normal heart sounds.    No murmur heard.  Pulmonary/Chest: Effort normal and breath sounds normal. She has no wheezes.   Abdominal: Soft. Bowel sounds are normal. She exhibits no mass. There is no tenderness.   Vitals reviewed.    Ortho Exam    Results  CBC  Diff   Lab Results   Component Value Date/Time    WBC 7.2 04/16/2016 10:25 AM    HGB 10.9 (L) 04/16/2016 10:25 AM    HCT 32.0 (L) 04/16/2016 10:25 AM    PLTCNT 240 04/16/2016 10:25 AM    RBC 3.81 (L) 04/16/2016 10:25 AM    MCV 84.1 04/16/2016 10:25 AM    MCHC 33.9 04/16/2016 10:25 AM    MCH 28.5 04/16/2016 10:25 AM    RDW 16.2 (H) 04/16/2016 10:25 AM    MPV 8.9 04/16/2016 10:25 AM    Lab Results   Component Value Date/Time    PMNS 70.8 04/16/2016 10:25 AM    LYMPHOCYTES 12.9 (L) 04/16/2016  10:25 AM    EOSINOPHIL 2.9 04/16/2016 10:25 AM    MONOCYTES 13.0 (H) 04/16/2016 10:25 AM    BASOPHILS 0.4 04/16/2016 10:25 AM    LYMPHSABS 0.90 (L) 04/16/2016 10:25 AM    EOSABS 0.20 04/16/2016 10:25 AM    MONOSABS 0.90 (H) 04/16/2016 10:25 AM    BASOSABS 0.00 04/16/2016 10:25 AM        TSH 1.18  Free t4 1.6  Fer 16 up from 7.8 on 7/16        04/16/16   Na 135  k 3.9  Cl 103  Bicarb 27  Glucose 91  Cal 10.6  Bun 28  Creat 1.7        Basic Metabolic Profile    Lab Results   Component Value Date/Time    SODIUM 141 07/04/2015 10:24 AM    SODIUM 139 02/08/2015 11:20 AM    POTASSIUM 4.3 07/04/2015 10:24 AM    POTASSIUM 3.9 02/08/2015  11:20 AM    CHLORIDE 109 (H) 07/04/2015 10:24 AM    CHLORIDE 107 02/08/2015 11:20 AM    CO2 25 07/04/2015 10:24 AM    CO2 25 02/08/2015 11:20 AM    Lab Results   Component Value Date/Time    BUN 28 (H) 07/04/2015 10:24 AM    BUN 28 (H) 02/08/2015 11:20 AM    CREATININE 1.4 (H) 07/04/2015 10:24 AM    CREATININE 1.4 (H) 02/08/2015 11:20 AM    GLUCOSENF 113 (H) 07/04/2015 10:24 AM    GLUCOSENF 119 (H) 03/21/2014 09:50 AM              02/21/16 PFT's read by wade ordered by cards due to starting amino   spirometry suggests restriction with broncho dilator response  Lung vol normal   Diffusion capacity severely decreased and only minimally corrects for hemo    Note dl/va 82 % normal  Diagnosis and Plan  1. Anxiety    2. Insomnia, unspecified type    3. AKI (acute kidney injury) ([place])    1. Anxiety  With insomnia improved some will increase dose to 50 mg qhs to see if help with sleep more if sleep improves but energy not improve then will add ssri  - traZODone (DESYREL) 50 mg Oral Tablet; Take 1 Tab (50 mg total) by mouth Every night  Dispense: 30 Tab; Refill: 5    2. Insomnia, unspecified type  See above   - traZODone (DESYREL) 50 mg Oral Tablet; Take 1 Tab (50 mg total) by mouth Every night  Dispense: 30 Tab; Refill: 5    3. AKI (acute kidney injury) ([place])  Creat increase with recheck bmi but  according to nephro note has had this creat previously  - BASIC METABOLIC PANEL; Future  Orders Placed This Encounter    BASIC METABOLIC PANEL    traZODone (DESYREL) 50 mg Oral Tablet       Darleen Crocker, DO

## 2016-06-26 NOTE — Patient Instructions (Signed)
Please call me in 1 month to let me know how you are sleeping and how your energy is doing after increasing your trazodone to 1 full pill at bed time.

## 2016-07-10 ENCOUNTER — Ambulatory Visit (INDEPENDENT_AMBULATORY_CARE_PROVIDER_SITE_OTHER): Payer: Medicare Other | Admitting: Endocrinology, Diabetes, & Metabolism

## 2016-07-10 ENCOUNTER — Encounter (INDEPENDENT_AMBULATORY_CARE_PROVIDER_SITE_OTHER): Payer: Self-pay | Admitting: Endocrinology, Diabetes, & Metabolism

## 2016-07-10 VITALS — BP 136/70 | HR 65 | Resp 20 | Ht 64.5 in | Wt 176.0 lb

## 2016-07-10 DIAGNOSIS — I10 Essential (primary) hypertension: Secondary | ICD-10-CM

## 2016-07-10 DIAGNOSIS — E039 Hypothyroidism, unspecified: Secondary | ICD-10-CM

## 2016-07-10 DIAGNOSIS — R7302 Impaired glucose tolerance (oral): Secondary | ICD-10-CM

## 2016-07-10 DIAGNOSIS — E538 Deficiency of other specified B group vitamins: Secondary | ICD-10-CM

## 2016-07-10 DIAGNOSIS — R600 Localized edema: Secondary | ICD-10-CM

## 2016-07-10 DIAGNOSIS — E213 Hyperparathyroidism, unspecified: Secondary | ICD-10-CM

## 2016-07-10 DIAGNOSIS — J40 Bronchitis, not specified as acute or chronic: Secondary | ICD-10-CM

## 2016-07-10 DIAGNOSIS — M810 Age-related osteoporosis without current pathological fracture: Secondary | ICD-10-CM

## 2016-07-10 DIAGNOSIS — I480 Paroxysmal atrial fibrillation: Secondary | ICD-10-CM

## 2016-07-10 LAB — BASIC METABOLIC PANEL
BUN: 25 mg/dL — ABNORMAL HIGH (ref 7–18)
CALCIUM: 10.3 mg/dL — ABNORMAL HIGH (ref 8.5–10.1)
CHLORIDE: 103 mmol/L (ref 98–107)
CO2 TOTAL: 28 mmol/L (ref 21–32)
CREATININE: 1.5 mg/dL — ABNORMAL HIGH (ref 0.6–1.0)
EGFR AA: 41
EGFR NON-AFRICAN AMERICAN: 34
GLUCOSE: 102 mg/dL (ref 74–106)
POTASSIUM: 3.9 mmol/L (ref 3.5–5.1)
SODIUM: 139 mmol/L (ref 136–145)

## 2016-07-10 NOTE — Progress Notes (Signed)
[facility] OFFICE  [address]  [place] [address]  [phone]        Patient name:  Cynthia Barrera  MRN:  [id number]  DOB:  October 24, 1941  DATE:  07/10/2016      Vitals:    07/10/16 1322   BP: 136/70   Pulse: 65   Resp: 20   SpO2: 93%   Weight: 79.8 kg (176 lb)   Height: 1.638 m (5' 4.5")       1. Hypertension, BP 138/72 mmHg:   Since her previous visit, with medication adjustment, she has done considerably better with stable home BP monitoring. Orit discontinued sotalol 80 mg BID (through Dr. Georg Ruddle office), lisinopril 40 mg Qam, Tenormin 12.5 and HCTz 25 mg daily. Note that clonidine and amlodipine remain in her medication list. On this regimen, BP control reviewed through her daily log has essentially returned to normal with only a few, transient exceptions (usually prior to bedtime). She continues follow-up with Dr. Delia Chimes with a pending exercise stress test. Atrial fibrillation diagnosed during a previous admission to [facility] for bronchitis has not been a problem at the time of her last visit and sinus rhythm had been maintained until recently. Her current maintenance on Eliquis and ASA remains.   Home monitoring has been obtained as often as twice daily with the expected variation - since January 2017 Cynthia Barrera has noted higher pressures with systolics in the 209 mmHg range and diastolics > 90 mmHg interspersed with lower pressures at other times. The medication has also contributed to dryness of the mucous membranes as well as drowziness. We'll resume HCTz 12.5 mg Qam, monitor her BMP and consider changing to a ARB/CCB combination. May 2017 labs included BUN 26 (31), creatinine 1.3 (1.5), eGFR 40 (34) ml/min. Lipids were reported as cholesterol 167, triglycerides 139, HDL 42 and LDL 97. Cynthia Barrera's current ten day monitoring results are quite stable with no changes indicated.  2. Hypothyroid:   Synthroid was reduced from 100 mcg Qam to 75 mcg  Qam based on [place] studies including TSH 0.474 mIU/mL (normal 0.370-4.420) and free thyroxine 2.10 ng/dL (normal 0.75-2.00). This is a considerably greater reduction than I would have recommended if a reduction was indicated at all. Since she has now taken the 75 mcg dosage for 8 weeks - the TSH obtained during the April hospitalization will suffice - TSH was normal at 2.13 uIU/mL. Further confirmation was obtained with the May 2017 labs with TSH of 1.180 uIU/ml and free thyroxine 1.6 ng/dl.   Thyroid examination was performed since Dr. Frederic Jericho felt there was a "change" - on exam, the prominence of both lobes was again noted with nodularity over the isthmic region appearing unchanged from previous exams - findings which were again confirmed. Thyroid ultrasound will be considered on return but deferred due to current cardiac evaluation.  3. Hyperparathyroid, stable:  Serum Ca++ 10.2-10.6 mg/dl, status post renal calculi last year with iPTH in July 2016 of 159 pg/ml and vitamin D of 19.2 ng/mL. The ionized calcium was 1.43 mmol/L (normal 1.12-1.32). With prior history of renal calculi, osteoporosis and evident chronic renal disease, Cynthia Barrera is a surgical candidate for all intents and purposes but to date has not elected this avenue. We'll again check serum calcium levels and ultimately discuss options further.  4. Plaquenil toxicity:   Cynthia Barrera notes no significant change in visual problems but feels that there may be some worsening. She is scheduled to  return for follow-up at [facility] with her next appointment in October 2015 with follow-up pending.  5. ? Glucose intolerance, random glucose 129 mg/dl   The study was on a non-fasting sample but raises minor concerns due to associated risk factors. Cynthia Barrera will monitor fasting and 2h post-prandial glucoses at home. The glucose log was reported anecdotally and considered "normal" by the patient. We'll continue weekly home monitoring and recording noting  that HbA1c was reported as 5.8% and BMP was essentially normal with a random glucose of 89 mg/dl. There was additional evidence to suggest progression.  6. Osteoporosis:  DEXA scan obtained recently with less than an ideal interpretation but was consistent with osteoporosis with a T score of -2.8 in the left hip area. She also had follow-up mammography with additional follow-up pending due to "grey" area in the left breast. Dr. Frederic Jericho suggested she discuss "supplements" with me during this visit. Vitamin D levels should be measured but I seriously doubt that calcium supplement is indicated due to established hyperparathyroidism and a recent history of renal calculi. A repeat serum calcium was reported as 10.6 mg/dl.  7. Renal calculi, secondary to hyperparathyroidism:  Cynthia Barrera was scheduled to undergo urethral dilatation as well as lithotripsy with antiplatelet therapy (Eliquis) to be discussed with Dr. Georg Ruddle office.  8. Vitamin B12 deficiency:  Cynthia Barrera was recently started on oral B12 therapy after an elevated MMA level was obtained - improvement was evident on a repeat study therefore oral replacement will continue.  9. Anemia, iron deficiency:  Studies through Dr. Candiss Norse in March 2016 revealed serum iron 48, % saturation 12% and ferritin 22.6 (8.0-252) with Hgb 9.3 gm/dl and Hct 30.1%. Since then she has had two iron infusions but she almost "passed out" after the last infusion - she recovered without in approximately one hour with no additional problems.  10. Sleep apnea, with full mask tolerated:  Cynthia Barrera has been using C-Pap for the last three months with significant improvement in sleep patterns  11. Paroxysmal atrial fibrillation, amiodarone started:  Cynthia Barrera's previous stability on sotalol 80 mg BID was flawed after "breakthrough" tachycardia occurred - she was seen through Dr. Normand Sloop with sotalol stopped and amiodarone 200 mg daily instituted - with established, underlying thyroid disease, monitoring will  be maintained on a consistent every 3 months basis.  Comment:  Cynthia Barrera's daughter recently was diagnosed with breast cancer and has gone through chemotherapy to date with a good response to date. This has been another source of "stress" but appears to be handled well although additional concern was raised recently when she was seen through [facility] with agreement only on the choice of chemotherapy.  Over the years we have demonstrated numerous circumstances of what can only be described as "white coat hypertension". The blood pressure elevation is transient and situational with resolution within 15-30 minutes. This was gain demonstrated this afternoon when the initial pressure of 220/94 mmHg decreased after only ten minutes to 205/86 mmHg. There are continued stresses that cna likely contribute to the elevation including her daughter's health status with additional surgery recently. On the other hand, Cynthia Barrera's new massage chair received at Christmas as well as her renewal of C-pap should help stabilize the rather mercurial BP swings.    Problem List Items Addressed This Visit     Essential hypertension    Hypothyroidism    Glucose intolerance (impaired glucose tolerance)    Bronchitis    Hyperparathyroidism ([place]) - Primary    Relevant Orders  BASIC METABOLIC PANEL    Atrial fibrillation (Cynthia Barrera)    Osteoporosis    Bilateral edema of lower extremity    B12 deficiency          Bobbe Medico, MD  07/10/2016, 13:27

## 2016-07-28 DIAGNOSIS — N281 Cyst of kidney, acquired: Secondary | ICD-10-CM | POA: Diagnosis not present

## 2016-07-28 DIAGNOSIS — N183 Chronic kidney disease, stage 3 (moderate): Secondary | ICD-10-CM | POA: Diagnosis not present

## 2016-07-28 DIAGNOSIS — I10 Essential (primary) hypertension: Secondary | ICD-10-CM | POA: Diagnosis not present

## 2016-08-05 DIAGNOSIS — J309 Allergic rhinitis, unspecified: Secondary | ICD-10-CM | POA: Diagnosis not present

## 2016-08-05 DIAGNOSIS — J069 Acute upper respiratory infection, unspecified: Secondary | ICD-10-CM | POA: Diagnosis not present

## 2016-08-25 DIAGNOSIS — I480 Paroxysmal atrial fibrillation: Secondary | ICD-10-CM | POA: Diagnosis not present

## 2016-08-25 DIAGNOSIS — Z23 Encounter for immunization: Secondary | ICD-10-CM | POA: Diagnosis not present

## 2016-08-25 DIAGNOSIS — G4713 Recurrent hypersomnia: Secondary | ICD-10-CM | POA: Diagnosis not present

## 2016-08-25 DIAGNOSIS — G4733 Obstructive sleep apnea (adult) (pediatric): Secondary | ICD-10-CM | POA: Diagnosis not present

## 2016-08-25 DIAGNOSIS — Z79899 Other long term (current) drug therapy: Secondary | ICD-10-CM | POA: Diagnosis not present

## 2016-08-25 DIAGNOSIS — J984 Other disorders of lung: Secondary | ICD-10-CM | POA: Diagnosis not present

## 2016-08-25 DIAGNOSIS — E6609 Other obesity due to excess calories: Secondary | ICD-10-CM | POA: Diagnosis not present

## 2016-08-25 DIAGNOSIS — R0602 Shortness of breath: Secondary | ICD-10-CM | POA: Diagnosis not present

## 2016-08-25 LAB — PULMONARY FUNCTION TEST

## 2016-09-05 DIAGNOSIS — M7989 Other specified soft tissue disorders: Secondary | ICD-10-CM | POA: Diagnosis not present

## 2016-09-05 DIAGNOSIS — M06861 Other specified rheumatoid arthritis, right knee: Secondary | ICD-10-CM | POA: Diagnosis not present

## 2016-09-05 DIAGNOSIS — M25561 Pain in right knee: Secondary | ICD-10-CM | POA: Diagnosis not present

## 2016-09-22 DIAGNOSIS — Z1231 Encounter for screening mammogram for malignant neoplasm of breast: Secondary | ICD-10-CM | POA: Diagnosis not present

## 2016-10-08 NOTE — Progress Notes (Signed)
[place] OFFICE  [address] [place] [address]  [place]  Date of Service: 10/08/2016    Chief Complaint:   Chief Complaint   Patient presents with    Abdominal Pain       History  HPI  Sleep doing better with increase dose of the trazodone but energy not any better.    Every 1-2 months have stomach cramps then resolves on it own not caused by constipation.     Pt tried the compression stocking but make her legs itch to much so not willing to use it has increase edema but the night time.    Current Outpatient Prescriptions   Medication Sig    amiodarone (PACERONE) 200 mg Oral Tablet Take 200 mg by mouth Once a day    amLODIPine (NORVASC) 5 mg Oral Tablet Take 5 mg by mouth Once a day    apixaban (ELIQUIS) 5 mg Oral Tablet Take 5 mg by mouth Twice daily    atenolol (TENORMIN) 25 mg Oral Tablet Take 12.5 mg by mouth Once a day    cloNIDine HCl (CATAPRES) 0.1 mg Oral Tablet Take 1 Tab (0.1 mg total) by mouth Twice daily    cyanocobalamin (VITAMIN B 12) 1,000 mcg Oral Tablet Take 1,000 mcg by mouth Once a day    diphenhydrAMINE (BENADRYL) 25 mg Oral Capsule Take 25 mg by mouth Every 6 hours as needed    ergocalciferol, vitamin D2, (DRISDOL) 50,000 unit Oral Capsule Take 50,000 Units by mouth Every 7 days    hydrALAZINE (APRESOLINE) 25 mg Oral Tablet Take 25 mg by mouth Twice daily    hydrOXYzine HCl (ATARAX) 25 mg Oral Tablet Take 1 Tab (25 mg total) by mouth Three times a day as needed for Itching or Anxiety    levothyroxine (SYNTHROID) 75 mcg Oral Tablet TAKE ONE TABLET BY MOUTH ONCE DAILY    lisinopril (PRINIVIL) 40 mg Oral Tablet TAKE ONE TABLET BY MOUTH ONCE DAILY    Non-Formulary/Special Preparation (MEDICATION HELP) provent starter pack    Sig 1 patch on each nostril at bedtime    polysaccharide iron complex (FERREX 150) 150 mg iron Oral Capsule TAKE ONE CAPSULE BY MOUTH ONCE DAILY    sertraline (ZOLOFT) 25 mg Oral Tablet Take 1  Tab (25 mg total) by mouth Once a day    traZODone (DESYREL) 50 mg Oral Tablet Take 1 Tab (50 mg total) by mouth Every night       Review of Systems    Examination  Vitals: BP (!) 146/80   Pulse 74   Ht 1.626 m (5\' 4" )   Wt 77 kg (169 lb 12.8 oz)   LMP  (Exact Date)   BMI 29.15 kg/m2  Physical Exam   Constitutional: She appears well-developed and well-nourished.   Neck: Neck supple.   Cardiovascular: Normal rate, regular rhythm and normal heart sounds.    No murmur heard.  Pulmonary/Chest: Effort normal and breath sounds normal. She has no wheezes.   Abdominal: Soft. Bowel sounds are normal. She exhibits no distension. There is no tenderness. There is no guarding.   Musculoskeletal: Edema: 3+ edema bilat lower ext.   Vitals reviewed.    Ortho Exam    Results  Basic Metabolic Profile    Lab Results   Component Value Date/Time    SODIUM 139 07/10/2016 07:20 PM    SODIUM 139 02/08/2015 11:20 AM  POTASSIUM 3.9 07/10/2016 07:20 PM    POTASSIUM 3.9 02/08/2015 11:20 AM    CHLORIDE 103 07/10/2016 07:20 PM    CHLORIDE 107 02/08/2015 11:20 AM    CO2 28 07/10/2016 07:20 PM    CO2 25 02/08/2015 11:20 AM    Lab Results   Component Value Date/Time    BUN 25 (H) 07/10/2016 07:20 PM    BUN 28 (H) 02/08/2015 11:20 AM    CREATININE 1.5 (H) 07/10/2016 07:20 PM    GLUCOSENF 102 07/10/2016 07:20 PM    GLUCOSENF 119 (H) 03/21/2014 09:50 AM          Diagnosis and Plan  1. Bilateral edema of lower extremity    2. Paroxysmal atrial fibrillation (HCC)    3. Essential hypertension    4. Fatigue, unspecified type    5. OSA (obstructive sleep apnea)    6. Depression, unspecified depression type    1. 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 Paroxysmal atrial fibrillation (HCC)  Rate controled monitor    3. Essential hypertension  Taking clonidine daily therefore will d/c and have pt check BP 2 x a week if BP elevated then will change  atenolol to coreg 12.5 bid and titrate dose since has more BP control then atenolol then see if can wean off the hydralazine to help with lower ext edema .  Post this if still have edema then will try to d/c the norvas and use low dose HCTZ to control the BP and monitor the creat..       4. Fatigue, unspecified type  See below pt using the CPAP every night x sat due to not want the hair to be messy for church on sunday    5. OSA (obstructive sleep apnea)  Compliant with use     6. Depression, unspecified depression type  With the flat affect and sleep improved suspect decrease energy due to depression add Zoloft low dose and lowly titrate to effect.  - sertraline (ZOLOFT) 25 mg Oral Tablet; Take 1 Tab (25 mg total) by mouth Once a day  Dispense: 30 Tab; Refill: 3  Orders Placed This Encounter    sertraline (ZOLOFT) 25 mg Oral Tablet       Darleen Crocker, DO

## 2016-10-09 ENCOUNTER — Encounter (INDEPENDENT_AMBULATORY_CARE_PROVIDER_SITE_OTHER): Payer: Self-pay | Admitting: Internal Medicine

## 2016-10-09 ENCOUNTER — Ambulatory Visit (INDEPENDENT_AMBULATORY_CARE_PROVIDER_SITE_OTHER): Payer: Medicare Other | Admitting: Internal Medicine

## 2016-10-09 VITALS — BP 146/80 | HR 74 | Ht 64.0 in | Wt 169.8 lb

## 2016-10-09 DIAGNOSIS — R600 Localized edema: Principal | ICD-10-CM

## 2016-10-09 DIAGNOSIS — F32A Depression, unspecified: Secondary | ICD-10-CM

## 2016-10-09 DIAGNOSIS — F329 Major depressive disorder, single episode, unspecified: Secondary | ICD-10-CM

## 2016-10-09 DIAGNOSIS — I10 Essential (primary) hypertension: Secondary | ICD-10-CM

## 2016-10-09 DIAGNOSIS — R5383 Other fatigue: Secondary | ICD-10-CM

## 2016-10-09 DIAGNOSIS — G4733 Obstructive sleep apnea (adult) (pediatric): Secondary | ICD-10-CM

## 2016-10-09 DIAGNOSIS — I480 Paroxysmal atrial fibrillation: Secondary | ICD-10-CM

## 2016-10-09 MED ORDER — SERTRALINE 25 MG TABLET
25.0000 mg | ORAL_TABLET | Freq: Every day | ORAL | 3 refills | Status: DC
Start: 2016-10-09 — End: 2016-12-25

## 2016-10-09 NOTE — Patient Instructions (Signed)
Please stop taking your clonidine     Check your blood pressure 2 x a week and let me know the results

## 2016-10-15 ENCOUNTER — Encounter (INDEPENDENT_AMBULATORY_CARE_PROVIDER_SITE_OTHER): Payer: Self-pay | Admitting: Endocrinology, Diabetes, & Metabolism

## 2016-10-15 ENCOUNTER — Ambulatory Visit (INDEPENDENT_AMBULATORY_CARE_PROVIDER_SITE_OTHER): Payer: Medicare Other | Admitting: Endocrinology, Diabetes, & Metabolism

## 2016-10-15 VITALS — BP 148/70 | HR 73 | Resp 20 | Ht 65.0 in | Wt 165.0 lb

## 2016-10-15 DIAGNOSIS — E213 Hyperparathyroidism, unspecified: Secondary | ICD-10-CM

## 2016-10-15 DIAGNOSIS — I10 Essential (primary) hypertension: Secondary | ICD-10-CM

## 2016-10-15 DIAGNOSIS — I480 Paroxysmal atrial fibrillation: Secondary | ICD-10-CM

## 2016-10-15 DIAGNOSIS — R7302 Impaired glucose tolerance (oral): Secondary | ICD-10-CM

## 2016-10-15 DIAGNOSIS — E039 Hypothyroidism, unspecified: Secondary | ICD-10-CM

## 2016-10-15 DIAGNOSIS — E785 Hyperlipidemia, unspecified: Secondary | ICD-10-CM

## 2016-10-15 DIAGNOSIS — D509 Iron deficiency anemia, unspecified: Secondary | ICD-10-CM

## 2016-10-15 NOTE — Progress Notes (Signed)
[name]  [place] [address]  [phone]        Patient name:  Cynthia Barrera  MRN:  [id number]  DOB:  07-19-1941  DATE:  10/15/2016      Vitals:    10/15/16 1112   BP: (!) 148/70   Pulse: 73   Resp: 20   SpO2: 97%   Weight: 74.8 kg (165 lb)   Height: 1.651 m ('5\' 5"' )       1. Hypertension, BP 138/72 mmHg:   Since her previous visit, with medication adjustment, she has done considerably better with stable home BP monitoring. Cynthia Barrera discontinued sotalol 80 mg BID (through Dr. Georg Ruddle office), lisinopril 40 mg Qam, Tenormin 12.5 and HCTz 25 mg daily. Since those changes, Dr. Candiss Norse has maintained lisnipril at 40 mg daily, atenolol 25 mg daily and started hydralazine 25 mg BID with dosage adjustment pending.   Atrial fibrillation diagnosed during a previous admission to [facility] for bronchitis has not been a problem at the time of her last visit and sinus rhythm had been maintained until recently. Her current maintenance on Eliquis, amiodarone and ASA remain.   May 2017 labs included BUN 26 (31), creatinine 1.3 (1.5), eGFR 40 (34) ml/min. Lipids were reported as cholesterol 167, triglycerides 139, HDL 42 and LDL 97. Cynthia Barrera's current ten day monitoring results are quite stable with no changes indicated.  2. Hypothyroid:   Synthroid was reduced from 100 mcg Qam to 75 mcg Qam based on [place] studies including TSH 0.474 mIU/mL (normal 0.370-4.420) and free thyroxine 2.10 ng/dL (normal 0.75-2.00). This is a considerably greater reduction than I would have recommended if a reduction was indicated at all. Since she has now taken the 75 mcg dosage for 8 weeks - the TSH obtained during the April hospitalization will suffice - TSH was normal at 2.13 uIU/mL. Further confirmation was obtained with the May 2017 labs with TSH of 1.180 uIU/ml and free thyroxine 1.6 ng/dl.   3. Hyperparathyroid, stable:  Serum Ca++ 10.2-10.6 mg/dl,  status post renal calculi last year with iPTH in July 2016 of 159 pg/ml and vitamin D of 19.2 ng/mL. The ionized calcium was 1.43 mmol/L (normal 1.12-1.32). With prior history of renal calculi, osteoporosis and evident chronic renal disease, Cynthia Barrera is a surgical candidate for all intents and purposes but to date has not elected this avenue. We'll again check serum calcium levels and ultimately discuss options further.  4. Plaquenil toxicity:   Cynthia Barrera notes no significant change in visual problems but feels that there may be some worsening. She is scheduled to return for follow-up at [facility] with her next appointment in December with potential cataract extraction pending.  5. ? Glucose intolerance, random glucose 102 mg/dl   The study was on a non-fasting sample but raises minor concerns due to associated risk factors. Cynthia Barrera will monitor fasting and 2h post-prandial glucoses at home. The glucose log was reported anecdotally and considered "normal" by the patient. We'll continue weekly home monitoring and recording noting that HbA1c was reported as 5.8% and BMP was essentially normal with a random glucose of 89 mg/dl. There was additional evidence to suggest progression.  6. Osteoporosis:  DEXA scan obtained recently with less than an ideal interpretation but was consistent with osteoporosis with a T score of -2.8 in the left hip area. She also had follow-up mammography with additional follow-up pending due to "grey" area in the left  breast. Dr. Frederic Jericho suggested she discuss "supplements" with me during this visit. Vitamin D levels should be measured but I seriously doubt that calcium supplement is indicated due to established hyperparathyroidism and a recent history of renal calculi. A repeat serum calcium was reported as 10.6 mg/dl.  7. Renal calculi, secondary to hyperparathyroidism:  Cynthia Barrera was scheduled to undergo urethral dilatation as well as lithotripsy with antiplatelet therapy (Eliquis) to be  discussed with Dr. Georg Ruddle office.  8. Vitamin B12 deficiency:  Cynthia Barrera was recently started on oral B12 therapy after an elevated MMA level was obtained - improvement was evident on a repeat study therefore oral replacement will continue.  9. Anemia, iron deficiency:  Studies through Dr. Candiss Norse in March 2016 revealed serum iron 48, % saturation 12% and ferritin 22.6 (8.0-252) with Hgb 9.3 gm/dl and Hct 30.1%. Since then she has had two iron infusions but she almost "passed out" after the last infusion - she recovered without intervention in approximately one hour with no additional problems.  10. Sleep apnea, with full mask tolerated:  Cynthia Barrera has been using C-Pap for the last three months with significant improvement in sleep patterns  11. Paroxysmal atrial fibrillation, amiodarone started:  Cynthia Barrera's previous stability on sotalol 80 mg BID was flawed after "breakthrough" tachycardia occurred - she was seen through Dr. Normand Sloop with sotalol stopped and amiodarone 200 mg daily instituted - with established, underlying thyroid disease, monitoring will be maintained on a consistent every 3 months basis.  Comment:  Cynthia Barrera's daughter recently was diagnosed with breast cancer and has gone through chemotherapy to date with a good response to date. This has been another source of "stress" but appears to be handled well although additional concern was raised recently when she was seen through [facility] with agreement only on the choice of chemotherapy.  Over the years we have demonstrated numerous circumstances of what can only be described as "white coat hypertension". The blood pressure elevation is transient and situational with resolution within 15-30 minutes. There are continued stresses that can likely contribute to the elevation including her daughter's health status with additional surgery recently. On the other hand, Cynthia Barrera's new massage chair received last Christmas as well as her renewal of C-pap should help stabilize  the rather mercurial BP swings.    Problem List Items Addressed This Visit     HLD (hyperlipidemia)    Essential hypertension    Hypothyroidism - Primary    Glucose intolerance (impaired glucose tolerance)    Hyperparathyroidism (HCC)    Atrial fibrillation (HCC)    IDA (iron deficiency anemia)          Bobbe Medico, MD  10/15/2016, 11:18

## 2016-10-30 ENCOUNTER — Other Ambulatory Visit (INDEPENDENT_AMBULATORY_CARE_PROVIDER_SITE_OTHER): Payer: Self-pay | Admitting: Internal Medicine

## 2016-10-30 ENCOUNTER — Other Ambulatory Visit (INDEPENDENT_AMBULATORY_CARE_PROVIDER_SITE_OTHER): Payer: Self-pay | Admitting: Endocrinology, Diabetes, & Metabolism

## 2016-10-30 DIAGNOSIS — E039 Hypothyroidism, unspecified: Secondary | ICD-10-CM

## 2016-10-30 MED ORDER — LEVOTHYROXINE 75 MCG TABLET
ORAL_TABLET | ORAL | 3 refills | Status: DC
Start: 2016-10-30 — End: 2016-12-25

## 2016-11-04 DIAGNOSIS — N183 Chronic kidney disease, stage 3 (moderate): Secondary | ICD-10-CM | POA: Diagnosis not present

## 2016-11-04 DIAGNOSIS — N281 Cyst of kidney, acquired: Secondary | ICD-10-CM | POA: Diagnosis not present

## 2016-11-04 DIAGNOSIS — M810 Age-related osteoporosis without current pathological fracture: Secondary | ICD-10-CM | POA: Diagnosis not present

## 2016-11-04 DIAGNOSIS — I129 Hypertensive chronic kidney disease with stage 1 through stage 4 chronic kidney disease, or unspecified chronic kidney disease: Secondary | ICD-10-CM | POA: Diagnosis not present

## 2016-11-04 DIAGNOSIS — R944 Abnormal results of kidney function studies: Secondary | ICD-10-CM | POA: Diagnosis not present

## 2016-11-05 DIAGNOSIS — H2513 Age-related nuclear cataract, bilateral: Secondary | ICD-10-CM | POA: Diagnosis not present

## 2016-11-05 DIAGNOSIS — H2512 Age-related nuclear cataract, left eye: Secondary | ICD-10-CM | POA: Diagnosis not present

## 2016-11-05 DIAGNOSIS — H5203 Hypermetropia, bilateral: Secondary | ICD-10-CM | POA: Diagnosis not present

## 2016-11-05 DIAGNOSIS — T378X5S Adverse effect of other specified systemic anti-infectives and antiparasitics, sequela: Secondary | ICD-10-CM | POA: Diagnosis not present

## 2016-11-05 DIAGNOSIS — H2511 Age-related nuclear cataract, right eye: Secondary | ICD-10-CM | POA: Diagnosis not present

## 2016-11-19 DIAGNOSIS — N281 Cyst of kidney, acquired: Secondary | ICD-10-CM | POA: Diagnosis not present

## 2016-11-19 DIAGNOSIS — I129 Hypertensive chronic kidney disease with stage 1 through stage 4 chronic kidney disease, or unspecified chronic kidney disease: Secondary | ICD-10-CM | POA: Diagnosis not present

## 2016-11-19 DIAGNOSIS — R808 Other proteinuria: Secondary | ICD-10-CM | POA: Diagnosis not present

## 2016-11-19 DIAGNOSIS — M810 Age-related osteoporosis without current pathological fracture: Secondary | ICD-10-CM | POA: Diagnosis not present

## 2016-11-19 DIAGNOSIS — R944 Abnormal results of kidney function studies: Secondary | ICD-10-CM | POA: Diagnosis not present

## 2016-11-19 DIAGNOSIS — N183 Chronic kidney disease, stage 3 (moderate): Secondary | ICD-10-CM | POA: Diagnosis not present

## 2016-12-04 ENCOUNTER — Ambulatory Visit (INDEPENDENT_AMBULATORY_CARE_PROVIDER_SITE_OTHER): Payer: Medicare Other | Admitting: Internal Medicine

## 2016-12-04 ENCOUNTER — Encounter (INDEPENDENT_AMBULATORY_CARE_PROVIDER_SITE_OTHER): Payer: Self-pay | Admitting: Internal Medicine

## 2016-12-04 ENCOUNTER — Other Ambulatory Visit (INDEPENDENT_AMBULATORY_CARE_PROVIDER_SITE_OTHER): Payer: Self-pay | Admitting: Internal Medicine

## 2016-12-04 VITALS — BP 126/60 | HR 59 | Resp 20 | Ht 64.0 in | Wt 163.2 lb

## 2016-12-04 DIAGNOSIS — I480 Paroxysmal atrial fibrillation: Secondary | ICD-10-CM

## 2016-12-04 DIAGNOSIS — Z9989 Dependence on other enabling machines and devices: Secondary | ICD-10-CM

## 2016-12-04 DIAGNOSIS — R5383 Other fatigue: Secondary | ICD-10-CM

## 2016-12-04 DIAGNOSIS — G4733 Obstructive sleep apnea (adult) (pediatric): Secondary | ICD-10-CM

## 2016-12-04 DIAGNOSIS — I10 Essential (primary) hypertension: Secondary | ICD-10-CM

## 2016-12-04 NOTE — Progress Notes (Signed)
[facility] MEDICINE & SPECIALTY OFFICE  [address] [place] [address]  [place]  Date of Service: 12/04/2016    Chief Complaint:   Chief Complaint   Patient presents with   . Lethargy       History  HPI  On the Zoloft no appetite not know if making her feel better since have no energy.  Husband came with her today and states get fatigue few min post starting to fix a meal. Also states BP increase if she eats anything with salt in it, per pt not eat until noon and per husband will eat toast and tea and then BP will increase.    Using the CPAP feel little better when awake but energy goes away in a few min    Felt bad for the past 1 months just no energy constipation ok right now.     Current Outpatient Prescriptions   Medication Sig   . amiodarone (PACERONE) 200 mg Oral Tablet Take 200 mg by mouth Once a day   . apixaban (ELIQUIS) 5 mg Oral Tablet Take 5 mg by mouth Twice daily   . cyanocobalamin (VITAMIN B 12) 1,000 mcg Oral Tablet Take 1,000 mcg by mouth Once a day   . diphenhydrAMINE (BENADRYL) 25 mg Oral Capsule Take 25 mg by mouth Every 6 hours as needed   . ergocalciferol, vitamin D2, (DRISDOL) 50,000 unit Oral Capsule Take 50,000 Units by mouth Every 7 days   . hydrALAZINE (APRESOLINE) 25 mg Oral Tablet Take 25 mg by mouth Twice daily   . levothyroxine (SYNTHROID) 75 mcg Oral Tablet TAKE ONE TABLET BY MOUTH ONCE DAILY   . lisinopril (PRINIVIL) 40 mg Oral Tablet TAKE ONE TABLET BY MOUTH ONCE DAILY   . Non-Formulary/Special Preparation (MEDICATION HELP) provent starter pack    Sig 1 patch on each nostril at bedtime   . polysaccharide iron complex (FERREX 150) 150 mg iron Oral Capsule TAKE ONE CAPSULE BY MOUTH ONCE DAILY   . sertraline (ZOLOFT) 25 mg Oral Tablet Take 1 Tab (25 mg total) by mouth Once a day       Review of Systems    Examination  Vitals: BP 126/60  Pulse 59  Resp 20  Ht 1.626 m (5\' 4" )  Wt 74 kg (163 lb 3.2 oz)  LMP  (Exact Date)  SpO2 96%   Breastfeeding? No  BMI 28.01 kg/m2   Laying 138/72, sit 158/76 stand 152/70 with ambulation po 95% pusle 58  Post walk 144/60 pulse 58  Physical Exam   Constitutional: She appears well-developed and well-nourished.   Cardiovascular: Normal rate, regular rhythm and normal heart sounds.    No murmur heard.  Pulmonary/Chest: Effort normal. She has wheezes.   Abdominal: Soft. Bowel sounds are normal. She exhibits no distension. There is no tenderness.   Musculoskeletal: She exhibits no edema.   Vitals reviewed.    Ortho Exam    Results  11/19/16 wbc 7.5 h/h 11/33 plt 231 mcv 83 rdw 17.7  Bmp normal creat 1.6  5/17 TSH 1.18    3/17 PFT's sig bd response mild restrictive pattern.  Diagnosis and Plan  1. Fatigue, unspecified type    2. Paroxysmal atrial fibrillation (HCC)    3. Essential hypertension    4. OSA on CPAP      1. Fatigue, unspecified type  With above tilts and pulse ox results not BP or oxegen issue but pt remains  brady with activity therefore will stop the atenolol completely and recheck pulse with ambulation next week if improved and energy not improve then look for other source    Suspect effect of bb increased since on aminio which has its own bb properties.     Pt also sleeping well therefore have asked her to stop the trazodone since it also can increase fatigue.    2. Paroxysmal atrial fibrillation (HCC)  On amnio    3. Essential hypertension  If BP increases then can increase hydralazine or add nor vas as needed   Also asked her not to use the clonidine unless BP > 170 due to heart rate slowing properties of it.     4. OSA on CPAP  Using the CPAP continue      Darleen Crocker, DO

## 2016-12-04 NOTE — Patient Instructions (Addendum)
Please stop the atenolol 25 mg 1/2 pill a day to see if the slow heart rate increasing the fatigue you have been feeling  Since you are not having trouble going to sleep stop the trazidone that you are taking at night.    Only take the clonidine if your blood pressure is greater then 170 (top number of the blood pressure reading)    Come back next week to have your heart rate and blood pressure checked with walking.    If you are taking hydroxyzine stop taking it

## 2016-12-05 DIAGNOSIS — Z1211 Encounter for screening for malignant neoplasm of colon: Secondary | ICD-10-CM | POA: Diagnosis not present

## 2016-12-05 DIAGNOSIS — L900 Lichen sclerosus et atrophicus: Secondary | ICD-10-CM | POA: Diagnosis not present

## 2016-12-05 DIAGNOSIS — N952 Postmenopausal atrophic vaginitis: Secondary | ICD-10-CM | POA: Diagnosis not present

## 2016-12-05 DIAGNOSIS — M810 Age-related osteoporosis without current pathological fracture: Secondary | ICD-10-CM | POA: Diagnosis not present

## 2016-12-06 ENCOUNTER — Other Ambulatory Visit (INDEPENDENT_AMBULATORY_CARE_PROVIDER_SITE_OTHER): Payer: Self-pay | Admitting: Internal Medicine

## 2016-12-10 ENCOUNTER — Telehealth (INDEPENDENT_AMBULATORY_CARE_PROVIDER_SITE_OTHER): Payer: Self-pay | Admitting: Internal Medicine

## 2016-12-10 ENCOUNTER — Encounter (INDEPENDENT_AMBULATORY_CARE_PROVIDER_SITE_OTHER): Payer: Medicare Other

## 2016-12-10 DIAGNOSIS — G4700 Insomnia, unspecified: Secondary | ICD-10-CM

## 2016-12-10 NOTE — Telephone Encounter (Signed)
Patient came to the office today for a nurse visit to check her vitals, per her patient instructions her resting vitals are 142/78, 69, 98% RA Resting  Walking vitals after 6 min walk, 148/80, 88,96% RA Resting    Patient states that she feels no better after stopping her medication from the last visit, she states she actually feels worse, she is requesting you call and talk to her husband when you get a chance. Elenore Rota [phone]

## 2016-12-11 ENCOUNTER — Telehealth (INDEPENDENT_AMBULATORY_CARE_PROVIDER_SITE_OTHER): Payer: Self-pay | Admitting: Internal Medicine

## 2016-12-11 DIAGNOSIS — I10 Essential (primary) hypertension: Secondary | ICD-10-CM

## 2016-12-11 MED ORDER — TRAZODONE 50 MG TABLET
25.0000 mg | ORAL_TABLET | Freq: Every evening | ORAL | 3 refills | Status: DC
Start: 2016-12-11 — End: 2017-05-07

## 2016-12-11 MED ORDER — AMLODIPINE 5 MG TABLET
5.0000 mg | ORAL_TABLET | Freq: Every day | ORAL | 4 refills | Status: DC
Start: 2016-12-11 — End: 2017-05-26

## 2016-12-11 NOTE — Telephone Encounter (Signed)
Pt brought in her BP log and BP running in the 160-170 at home over 70-80 so called pt back and will start her on norvas 5 mg and have check her BP at home in 1 week to see if BP decreased if not then increase dose to 10 mg.

## 2016-12-11 NOTE — Telephone Encounter (Signed)
Off the atenolol hr doing better but was feeling worse yesterday but feels a little better today not know why.  However since stop the trazodone awaking up 2-3 x a night therefore will restart the trazodone 25 mg qhs to help with sleep may help with her feeling better.

## 2016-12-17 DIAGNOSIS — I480 Paroxysmal atrial fibrillation: Secondary | ICD-10-CM | POA: Diagnosis not present

## 2016-12-17 DIAGNOSIS — D649 Anemia, unspecified: Secondary | ICD-10-CM | POA: Diagnosis not present

## 2016-12-17 DIAGNOSIS — R0602 Shortness of breath: Secondary | ICD-10-CM | POA: Diagnosis not present

## 2016-12-17 DIAGNOSIS — R5383 Other fatigue: Secondary | ICD-10-CM | POA: Diagnosis not present

## 2016-12-17 DIAGNOSIS — I10 Essential (primary) hypertension: Secondary | ICD-10-CM | POA: Diagnosis not present

## 2016-12-17 DIAGNOSIS — I208 Other forms of angina pectoris: Secondary | ICD-10-CM | POA: Diagnosis not present

## 2016-12-21 DIAGNOSIS — N390 Urinary tract infection, site not specified: Secondary | ICD-10-CM | POA: Diagnosis not present

## 2016-12-21 DIAGNOSIS — I10 Essential (primary) hypertension: Secondary | ICD-10-CM | POA: Diagnosis not present

## 2016-12-21 DIAGNOSIS — R5381 Other malaise: Secondary | ICD-10-CM | POA: Diagnosis not present

## 2016-12-21 DIAGNOSIS — R531 Weakness: Secondary | ICD-10-CM | POA: Diagnosis not present

## 2016-12-22 DIAGNOSIS — R0602 Shortness of breath: Secondary | ICD-10-CM | POA: Diagnosis not present

## 2016-12-22 DIAGNOSIS — R509 Fever, unspecified: Secondary | ICD-10-CM | POA: Diagnosis not present

## 2016-12-22 DIAGNOSIS — R5383 Other fatigue: Secondary | ICD-10-CM | POA: Diagnosis not present

## 2016-12-22 DIAGNOSIS — I208 Other forms of angina pectoris: Secondary | ICD-10-CM | POA: Diagnosis not present

## 2016-12-22 DIAGNOSIS — I480 Paroxysmal atrial fibrillation: Secondary | ICD-10-CM | POA: Diagnosis not present

## 2016-12-25 ENCOUNTER — Ambulatory Visit (INDEPENDENT_AMBULATORY_CARE_PROVIDER_SITE_OTHER): Payer: Medicare Other | Admitting: Internal Medicine

## 2016-12-25 ENCOUNTER — Encounter (INDEPENDENT_AMBULATORY_CARE_PROVIDER_SITE_OTHER): Payer: Self-pay | Admitting: Internal Medicine

## 2016-12-25 VITALS — BP 138/86 | HR 60 | Ht 64.0 in | Wt 156.0 lb

## 2016-12-25 DIAGNOSIS — R5383 Other fatigue: Secondary | ICD-10-CM

## 2016-12-25 DIAGNOSIS — E213 Hyperparathyroidism, unspecified: Secondary | ICD-10-CM

## 2016-12-25 DIAGNOSIS — N189 Chronic kidney disease, unspecified: Secondary | ICD-10-CM

## 2016-12-25 LAB — BASIC METABOLIC PANEL
BUN: 21 mg/dL — ABNORMAL HIGH (ref 7–18)
CALCIUM: 10.6 mg/dL — ABNORMAL HIGH (ref 8.5–10.1)
CHLORIDE: 106 mmol/L (ref 98–107)
CO2 TOTAL: 23 mmol/L (ref 21–32)
CREATININE: 1.5 mg/dL — ABNORMAL HIGH (ref 0.6–1.0)
EGFR AA: 41
EGFR NON-AFRICAN AMERICAN: 34
GLUCOSE: 103 mg/dL (ref 74–106)
POTASSIUM: 3.8 mmol/L (ref 3.5–5.1)
POTASSIUM: 3.8 mmol/L (ref 3.5–5.1)
SODIUM: 137 mmol/L (ref 136–145)

## 2016-12-25 LAB — PHOSPHORUS: PHOSPHORUS: 2 mg/dL — ABNORMAL LOW (ref 2.5–4.9)

## 2016-12-25 LAB — CBC
HCT: 36.9 % (ref 36.0–47.0)
HGB: 12.1 g/dL (ref 12.0–15.0)
MCH: 27.4 pg (ref 27.0–32.0)
MCHC: 32.7 g/dL (ref 32.0–36.0)
MCV: 83.7 fL (ref 80.0–100.0)
MPV: 8.7 fL (ref 6.6–9.3)
PLATELET COUNT: 264 x10 (ref 140–450)
RBC: 4.41 x10 (ref 4.20–5.40)
RDW: 18.6 % — ABNORMAL HIGH (ref 12.0–15.0)
WBC: 7.5 x10 (ref 4.8–10.8)

## 2016-12-25 NOTE — Progress Notes (Signed)
[facility] MEDICINE & SPECIALTY OFFICE  [address] [place] [address]  [place]  Date of Service: 12/25/2016    Chief Complaint:   Chief Complaint   Patient presents with   . Lethargy       History  HPI  Had the stress test at dr Delia Chimes office 1/22 but not heard the result at dr Delia Chimes office.    Started the [place] 1/23 but causing nause but trying to take    Not taking the trazodone sleeping ok.    Synthroid decrease to 50 mcg by cards.    Not feeling any better post change in meds last month    To go to [facility] on Tues for cataract surgery.  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    Last saw her kidney doc dr Irine Seal in 12/17    Current Outpatient Prescriptions   Medication Sig   . amiodarone (PACERONE) 100 mg Oral Tablet Take 100 mg by mouth Once a day   . amLODIPine (NORVASC) 5 mg Oral Tablet Take 1 Tab (5 mg total) by mouth Once a day   . apixaban (ELIQUIS) 5 mg Oral Tablet Take 5 mg by mouth Twice daily   . cholecalciferol, vitamin D3, 1,000 unit Oral Tablet Take 2,000 Units by mouth Once a day   . clobetasol (TEMOVATE) 0.05 % Ointment 5 mL by Apply Topically route Once a day   . cyanocobalamin (VITAMIN B 12) 1,000 mcg Oral Tablet Take 1,000 mcg by mouth Once a day   . diphenhydrAMINE (BENADRYL) 25 mg Oral Capsule Take 25 mg by mouth Every 6 hours as needed   . levothyroxine (SYNTHROID) 50 mcg Oral Tablet Take 50 mcg by mouth Every morning   . lisinopril (PRINIVIL) 40 mg Oral Tablet TAKE ONE TABLET BY MOUTH ONCE DAILY   . polysaccharide iron complex (FERREX 150) 150 mg iron Oral Capsule TAKE ONE CAPSULE BY MOUTH ONCE DAILY   . potassium chloride (K-DUR) 10 mEq Oral Tab Sust.Rel. Particle/Crystal Take 10 mEq by mouth Once a day   . traZODone (DESYREL) 50 mg Oral Tablet Take 0.5 Tabs (25 mg total) by mouth Every night   . [DISCONTINUED] levothyroxine (SYNTHROID) 75 mcg Oral Tablet TAKE ONE TABLET BY MOUTH ONCE DAILY        Review of Systems    Examination  Vitals: BP 138/86  Pulse 60  Ht 1.626 m ('5\' 4"' )  Wt 70.8 kg (156 lb)  LMP  (Exact Date)  SpO2 96% Comment: RA Resting  BMI 26.78 kg/m2  Physical Exam   Constitutional: She appears well-developed and well-nourished.   Neck: Neck supple.   Cardiovascular: Normal rate, regular rhythm and normal heart sounds.    No murmur heard.  Pulmonary/Chest: Effort normal and breath sounds normal. She has no wheezes.   Abdominal: Soft. Bowel sounds are normal. There is no tenderness.   Vitals reviewed.    Ortho Exam    Results  12/22/16 esr 22  Bmp na 136 K 3.1 cl 107 bicarb 20 ca 10.8 bun 20 creat 1.4    Cal ionized 5.91 nl 4.57-5.43    PTH 256  Vit d 22.8        1.17 TSH 1.69  t4 14.3 (nl high 10.9 t3 uptake 36.1    lft normal  Diagnosis and Plan  1. Fatigue, unspecified type    2. Hyperparathyroidism ([place])  3. CRD (chronic renal disease)    1. Fatigue, unspecified type  Unclear source check cbc since not done on Monday to look for source  Not on any meds that would increase fatigue s/p stress test await result to see if the source but low probability as the source  - CBC; Future  - CBC    2. Hyperparathyroidism ([place])  Has had for years but ionized ca increasing from last check this may be the source of the fatigue    3. CRD (chronic renal disease)  Labs on Monday sig for decrease bicarb will recheck and check phos as well as vbg to see if pt getting acidemic if so may need to treat if coming from renal source note la done on Monday was less then 2 so doubt from ischemia   Will request last prog note fromher kidney doc dr [place] Silos  - BASIC METABOLIC PANEL; Future  - Phosphorus; Future  - HELP LAB ORDER; Future  - BASIC METABOLIC PANEL  - Phosphorus  - HELP LAB ORDER  Orders Placed This Encounter   . BASIC METABOLIC PANEL   . Phosphorus   . CBC   . HELP LAB ORDER       Darleen Crocker, DO

## 2016-12-26 ENCOUNTER — Telehealth (INDEPENDENT_AMBULATORY_CARE_PROVIDER_SITE_OTHER): Payer: Self-pay | Admitting: Internal Medicine

## 2016-12-26 NOTE — Telephone Encounter (Signed)
Cal up phos low PTH high with these results an the fatigue and depression that she is feeling suspect the sym from the hyperparathyroid. N she is scheduled to see dr Raquel Sarna next month rec that she undoergo parathyroid surgical removal.  Pt will talk to endo regarding the hyperparathyroid hopefully she will be willing to undergo surgical correction if not then will consider bisphonate to decrease ca. Plus /minus cinacalcet to reduces the serum calcium concentration.

## 2016-12-30 DIAGNOSIS — Z7982 Long term (current) use of aspirin: Secondary | ICD-10-CM | POA: Diagnosis not present

## 2016-12-30 DIAGNOSIS — I480 Paroxysmal atrial fibrillation: Secondary | ICD-10-CM | POA: Diagnosis not present

## 2016-12-30 DIAGNOSIS — E213 Hyperparathyroidism, unspecified: Secondary | ICD-10-CM | POA: Diagnosis not present

## 2016-12-30 DIAGNOSIS — G4730 Sleep apnea, unspecified: Secondary | ICD-10-CM | POA: Diagnosis not present

## 2016-12-30 DIAGNOSIS — M810 Age-related osteoporosis without current pathological fracture: Secondary | ICD-10-CM | POA: Diagnosis not present

## 2016-12-30 DIAGNOSIS — M069 Rheumatoid arthritis, unspecified: Secondary | ICD-10-CM | POA: Diagnosis not present

## 2016-12-30 DIAGNOSIS — F419 Anxiety disorder, unspecified: Secondary | ICD-10-CM | POA: Diagnosis not present

## 2016-12-30 DIAGNOSIS — Z7901 Long term (current) use of anticoagulants: Secondary | ICD-10-CM | POA: Diagnosis not present

## 2016-12-30 DIAGNOSIS — G4700 Insomnia, unspecified: Secondary | ICD-10-CM | POA: Diagnosis not present

## 2016-12-30 DIAGNOSIS — E039 Hypothyroidism, unspecified: Secondary | ICD-10-CM | POA: Diagnosis not present

## 2016-12-30 DIAGNOSIS — I10 Essential (primary) hypertension: Secondary | ICD-10-CM | POA: Diagnosis not present

## 2016-12-30 DIAGNOSIS — E785 Hyperlipidemia, unspecified: Secondary | ICD-10-CM | POA: Diagnosis not present

## 2016-12-30 DIAGNOSIS — H2512 Age-related nuclear cataract, left eye: Secondary | ICD-10-CM | POA: Diagnosis not present

## 2017-01-02 ENCOUNTER — Emergency Department (HOSPITAL_COMMUNITY)
Admission: EM | Admit: 2017-01-02 | Discharge: 2017-01-02 | Disposition: A | Discharge status: Home / Self Care | Payer: Medicare Other | Attending: Emergency Medicine | Admitting: Emergency Medicine

## 2017-01-02 ENCOUNTER — Encounter (HOSPITAL_COMMUNITY): Payer: Self-pay | Admitting: *Deleted

## 2017-01-02 DIAGNOSIS — D649 Anemia, unspecified: Secondary | ICD-10-CM | POA: Diagnosis not present

## 2017-01-02 DIAGNOSIS — Z7901 Long term (current) use of anticoagulants: Secondary | ICD-10-CM | POA: Insufficient documentation

## 2017-01-02 DIAGNOSIS — R112 Nausea with vomiting, unspecified: Secondary | ICD-10-CM | POA: Diagnosis not present

## 2017-01-02 DIAGNOSIS — E860 Dehydration: Secondary | ICD-10-CM

## 2017-01-02 DIAGNOSIS — R1110 Vomiting, unspecified: Secondary | ICD-10-CM | POA: Diagnosis present

## 2017-01-02 DIAGNOSIS — I10 Essential (primary) hypertension: Secondary | ICD-10-CM | POA: Diagnosis not present

## 2017-01-02 DIAGNOSIS — Z79899 Other long term (current) drug therapy: Secondary | ICD-10-CM | POA: Diagnosis not present

## 2017-01-02 HISTORY — DX: Disorder of thyroid, unspecified: E07.9

## 2017-01-02 HISTORY — DX: Chronic atrial fibrillation, unspecified: I48.20

## 2017-01-02 HISTORY — DX: Fibromyalgia: M79.7

## 2017-01-02 HISTORY — DX: Unspecified osteoarthritis, unspecified site: M19.90

## 2017-01-02 HISTORY — DX: Essential (primary) hypertension: I10

## 2017-01-02 LAB — URINALYSIS, ROUTINE W REFLEX MICROSCOPIC
Bilirubin Urine: NEGATIVE
Glucose, UA: NEGATIVE mg/dL
Hgb urine dipstick: NEGATIVE
Ketones, ur: NEGATIVE mg/dL
Nitrite: NEGATIVE
PROTEIN: NEGATIVE mg/dL
Specific Gravity, Urine: 1.017 (ref 1.005–1.030)
pH: 5 (ref 5.0–8.0)

## 2017-01-02 LAB — BASIC METABOLIC PANEL
Anion gap: 8 (ref 5–15)
BUN: 20 mg/dL (ref 6–20)
CHLORIDE: 102 mmol/L (ref 101–111)
CO2: 25 mmol/L (ref 22–32)
Calcium: 11.1 mg/dL — ABNORMAL HIGH (ref 8.9–10.3)
Creatinine, Ser: 1.37 mg/dL — ABNORMAL HIGH (ref 0.44–1.00)
GFR calc Af Amer: 43 mL/min — ABNORMAL LOW (ref >60)
GFR calc non Af Amer: 37 mL/min — ABNORMAL LOW (ref >60)
GLUCOSE: 103 mg/dL — AB (ref 65–99)
POTASSIUM: 3.8 mmol/L (ref 3.5–5.1)
Sodium: 135 mmol/L (ref 135–145)

## 2017-01-02 LAB — CBG MONITORING, ED: GLUCOSE-CAPILLARY: 113 mg/dL — AB (ref 65–99)

## 2017-01-02 LAB — CBC
HEMATOCRIT: 36.9 % (ref 36.0–46.0)
Hemoglobin: 11.8 g/dL — ABNORMAL LOW (ref 12.0–15.0)
MCH: 26.4 pg (ref 26.0–34.0)
MCHC: 32 g/dL (ref 30.0–36.0)
MCV: 82.6 fL (ref 78.0–100.0)
Platelets: 251 10*3/uL (ref 150–400)
RBC: 4.47 MIL/uL (ref 3.87–5.11)
RDW: 17.8 % — AB (ref 11.5–15.5)
WBC: 7.9 10*3/uL (ref 4.0–10.5)

## 2017-01-02 LAB — TROPONIN I

## 2017-01-02 LAB — POC OCCULT BLOOD, ED: Fecal Occult Bld: NEGATIVE

## 2017-01-02 MED ORDER — METOCLOPRAMIDE HCL 10 MG PO TABS: 5.0000 mg | ORAL_TABLET | Freq: Four times a day (QID) | ORAL | 0 refills | Status: AC | PRN

## 2017-01-02 MED ORDER — SODIUM CHLORIDE 0.9 % IV BOLUS (SEPSIS)
2000.0000 mL | Freq: Once | INTRAVENOUS | Status: AC
  Administered 2017-01-02: 2000 mL via INTRAVENOUS

## 2017-01-02 MED ORDER — THIAMINE HCL 100 MG/ML IJ SOLN
100.0000 mg | Freq: Once | INTRAMUSCULAR | Status: AC
  Administered 2017-01-02: 100 mg via INTRAVENOUS
  Filled 2017-01-02: qty 2

## 2017-01-02 NOTE — ED Provider Notes (Signed)
[facility] DEPT Provider Note   CSN: [id number] Arrival date & time: 01/02/17  1410     History   Chief Complaint Chief Complaint  Patient presents with  . Emesis    HPI Teresa [place] is a 76 y.o. female.  HPI Complains of generalized weakness for the past 2 weeks with vomiting approximately once every other day for the past 2 weeks. Other associated symptoms include diminished appetite. Patient with known hypercalcemia. Her husband reports that serum calcium typically runs around 11. She is being followed by her primary care physician in [place] for same complaint. She is visiting here for recent cataract surgery and has appointment with cardiologist scheduled for 01/05/2017 for "second opinion" regarding weakness. Denies any chest pain denies headache denies abdominal pain last bowel movement yesterday reported as "black." No other associated symptoms. No treatment prior to coming here nothing makes symptoms better or worse Past Medical History:  Diagnosis Date  . Arthritis   . Atrial fibrillation, chronic ([place])   . Fibromyalgia   . Hypertension   . Thyroid disease     There are no active problems to display for this patient.   Past Surgical History:  Procedure Laterality Date  . TONSILLECTOMY    . WRIST ARTHROSCOPY Left     OB History    No data available       Home Medications    Prior to Admission medications   Not on File  Medications iron  Family History No family history on file.  Social History Social History  Substance Use Topics  . Smoking status: Never Smoker  . Smokeless tobacco: Never Used  . Alcohol use No     Allergies   Ciprofloxacin; Digoxin and related; Sulfa antibiotics; and Clindamycin/lincomycin   Review of Systems Review of Systems  Constitutional: Positive for appetite change.  HENT: Negative.   Respiratory: Negative.   Cardiovascular: Negative.   Gastrointestinal: Positive for vomiting.  Musculoskeletal:  Negative.   Skin: Negative.   Neurological: Positive for weakness.       Generalized weakness  Psychiatric/Behavioral: Negative.   All other systems reviewed and are negative.    Physical Exam Updated Vital Signs BP 130/74 (BP Location: Right Arm)   Pulse 69   Temp 98 F (36.7 C) (Oral)   Resp 18   Ht 5\' 5"  (1.651 m)   Wt 153 lb (69.4 kg)   SpO2 98%   BMI 25.46 kg/m   Physical Exam  Constitutional: She is oriented to person, place, and time. She appears well-developed and well-nourished.  HENT:  Head: Normocephalic and atraumatic.  Eyes: Conjunctivae are normal. Pupils are equal, round, and reactive to light.  Neck: Neck supple. No tracheal deviation present. No thyromegaly present.  Cardiovascular: Normal rate and regular rhythm.   No murmur heard. Pulmonary/Chest: Effort normal and breath sounds normal.  Abdominal: Soft. Bowel sounds are normal. She exhibits no distension. There is no tenderness.  Genitourinary: Rectal exam shows guaiac negative stool.  Genitourinary Comments: Rectal normal tone brown stool Hemoccult negative  Musculoskeletal: Normal range of motion. She exhibits no edema or tenderness.  Neurological: She is alert and oriented to person, place, and time. Coordination normal.  Skin: Skin is warm and dry. No rash noted.  Psychiatric: She has a normal mood and affect.  Nursing note and vitals reviewed.    ED Treatments / Results  Labs (all labs ordered are listed, but only abnormal results are displayed) Labs Reviewed  BASIC METABOLIC PANEL - Abnormal;  Notable for the following:       Result Value   Glucose, Bld 103 (*)    Creatinine, Ser 1.37 (*)    Calcium 11.1 (*)    GFR calc non Af Amer 37 (*)    GFR calc Af Amer 43 (*)    All other components within normal limits  CBC - Abnormal; Notable for the following:    Hemoglobin 11.8 (*)    RDW 17.8 (*)    All other components within normal limits  CBG MONITORING, ED - Abnormal; Notable for the  following:    Glucose-Capillary 113 (*)    All other components within normal limits  URINALYSIS, ROUTINE W REFLEX MICROSCOPIC  TROPONIN I  POC OCCULT BLOOD, ED    EKG  EKG Interpretation  Date/Time:  Friday January 02 2017 14:26:43 EST Ventricular Rate:  70 PR Interval:  172 QRS Duration: 90 QT Interval:  448 QTC Calculation: 483 R Axis:   47 Text Interpretation:  Sinus rhythm with occasional Premature ventricular complexes Left ventricular hypertrophy with repolarization abnormality Abnormal ECG No old tracing to compare Confirmed by Winfred Leeds  MD, Granger Chui [phone]) on 01/02/2017 4:24:27 PM      Results for orders placed or performed during the hospital encounter of 97/67/34  Basic metabolic panel  Result Value Ref Range   Sodium 135 135 - 145 mmol/L   Potassium 3.8 3.5 - 5.1 mmol/L   Chloride 102 101 - 111 mmol/L   CO2 25 22 - 32 mmol/L   Glucose, Bld 103 (H) 65 - 99 mg/dL   BUN 20 6 - 20 mg/dL   Creatinine, Ser 1.37 (H) 0.44 - 1.00 mg/dL   Calcium 11.1 (H) 8.9 - 10.3 mg/dL   GFR calc non Af Amer 37 (L) >60 mL/min   GFR calc Af Amer 43 (L) >60 mL/min   Anion gap 8 5 - 15  CBC  Result Value Ref Range   WBC 7.9 4.0 - 10.5 K/uL   RBC 4.47 3.87 - 5.11 MIL/uL   Hemoglobin 11.8 (L) 12.0 - 15.0 g/dL   HCT 36.9 36.0 - 46.0 %   MCV 82.6 78.0 - 100.0 fL   MCH 26.4 26.0 - 34.0 pg   MCHC 32.0 30.0 - 36.0 g/dL   RDW 17.8 (H) 11.5 - 15.5 %   Platelets 251 150 - 400 K/uL  Urinalysis, Routine w reflex microscopic  Result Value Ref Range   Color, Urine YELLOW YELLOW   APPearance HAZY (A) CLEAR   Specific Gravity, Urine 1.017 1.005 - 1.030   pH 5.0 5.0 - 8.0   Glucose, UA NEGATIVE NEGATIVE mg/dL   Hgb urine dipstick NEGATIVE NEGATIVE   Bilirubin Urine NEGATIVE NEGATIVE   Ketones, ur NEGATIVE NEGATIVE mg/dL   Protein, ur NEGATIVE NEGATIVE mg/dL   Nitrite NEGATIVE NEGATIVE   Leukocytes, UA SMALL (A) NEGATIVE   RBC / HPF 0-5 0 - 5 RBC/hpf   WBC, UA 6-30 0 - 5 WBC/hpf   Bacteria,  UA RARE (A) NONE SEEN   Squamous Epithelial / LPF 0-5 (A) NONE SEEN   Mucous PRESENT    Hyaline Casts, UA PRESENT   Troponin I  Result Value Ref Range   Troponin I <0.03 <0.03 ng/mL  CBG monitoring, ED  Result Value Ref Range   Glucose-Capillary 113 (H) 65 - 99 mg/dL   Comment 1 Notify RN    Comment 2 Document in Chart   POC occult blood, ED Provider will collect  Result Value  Ref Range   Fecal Occult Bld NEGATIVE NEGATIVE   No results found. Radiology No results found.  Procedures Procedures (including critical care time)  Medications Ordered in ED Medications  sodium chloride 0.9 % bolus 2,000 mL (not administered)  thiamine (B-1) injection 100 mg (not administered)     Initial Impression / Assessment and Plan / ED Course  I have reviewed the triage vital signs and the nursing notes.  Pertinent labs & imaging results that were available during my care of the patient were reviewed by me and considered in my medical decision making (see chart for details).    Patient feels improved after treatment with intravenous normal saline. She also ate a full meal while here. She feels ready to go home Plan prescription Reglan. Urine sent for culture .She will keep her scheduled plan with cardiologist in 3 days. Suggest oral hydration.   follow-up with her PMD in [place] next week  Final Clinical Impressions,  / ED Diagnoses   diagnosis #1 mild dehydration  #2 nausea and vomiting  #3 hypercalcemia  #4 anemia Final diagnoses:  None    New Prescriptions New Prescriptions   No medications on file     Orlie Dakin, MD 01/02/17 1911

## 2017-01-02 NOTE — ED Triage Notes (Signed)
Pt in c/o n/v onset x 2 wks, pt denies SOB, CP, diarrhea, pt has appt with cardiology today, pt came here d/t symptoms, pt lives in [place], pt had cataract sx this Tues at [facility] & pt was going to have a second opinion of cardiologist while here per pts daughter, pt reports hx of a fib, pt takes Eliquis, pt A&O x4

## 2017-01-02 NOTE — ED Notes (Signed)
Dietary brought pt meal tray.

## 2017-01-02 NOTE — Discharge Instructions (Signed)
Make sure that you drink at least six 8 ounce glasses of water or Gatorade each day in order to stay well-hydrated. Take the medication prescribed as needed for nausea. Keep your scheduled appointment with your cardiologist in 3 days. Contact your primary care physician to be seen next week. Return if you're unable to hold down fluids after taking the medication prescribed for nausea or if you feel worse for any reason

## 2017-01-03 LAB — URINE CULTURE
Culture: >=100000 — AB
SPECIAL REQUESTS: NORMAL

## 2017-01-04 ENCOUNTER — Telehealth: Payer: Self-pay

## 2017-01-04 NOTE — Telephone Encounter (Signed)
Post ED Visit - Positive Culture Follow-up  Culture report reviewed by antimicrobial stewardship pharmacist:  []  Elenor Quinones, Pharm.D. []  Heide Guile, Pharm.D., BCPS []  Parks Neptune, Pharm.D. [x]  Alycia Rossetti, Pharm.D., BCPS []  [place], [place].D., BCPS, AAHIVP []  Legrand Como, Pharm.D., BCPS, AAHIVP []  Milus Glazier, Pharm.D. []  Stephens November, Pharm.D.  urine culture  no further patient follow-up is required at this time.  Genia Del 01/04/2017, 2:17 PM

## 2017-01-06 ENCOUNTER — Encounter (INDEPENDENT_AMBULATORY_CARE_PROVIDER_SITE_OTHER): Payer: Self-pay | Admitting: Internal Medicine

## 2017-01-09 DIAGNOSIS — R5383 Other fatigue: Secondary | ICD-10-CM | POA: Diagnosis not present

## 2017-01-09 DIAGNOSIS — R0602 Shortness of breath: Secondary | ICD-10-CM | POA: Diagnosis not present

## 2017-01-11 DIAGNOSIS — R002 Palpitations: Secondary | ICD-10-CM | POA: Diagnosis not present

## 2017-01-12 ENCOUNTER — Telehealth: Payer: Self-pay | Admitting: Internal Medicine

## 2017-01-12 ENCOUNTER — Telehealth: Payer: Self-pay

## 2017-01-12 NOTE — Telephone Encounter (Signed)
Daughter called, states we should have received some lab results from her last lab draw. I advised her to get them to resend it. No other questions at this time.

## 2017-01-13 ENCOUNTER — Encounter (INDEPENDENT_AMBULATORY_CARE_PROVIDER_SITE_OTHER): Payer: Medicare Other | Admitting: Endocrinology, Diabetes, & Metabolism

## 2017-01-14 ENCOUNTER — Other Ambulatory Visit (HOSPITAL_COMMUNITY): Payer: Medicare Other

## 2017-01-14 DIAGNOSIS — H2511 Age-related nuclear cataract, right eye: Secondary | ICD-10-CM | POA: Diagnosis not present

## 2017-01-15 ENCOUNTER — Encounter (INDEPENDENT_AMBULATORY_CARE_PROVIDER_SITE_OTHER): Payer: Medicare Other | Admitting: Endocrinology, Diabetes, & Metabolism

## 2017-01-15 ENCOUNTER — Encounter (INDEPENDENT_AMBULATORY_CARE_PROVIDER_SITE_OTHER): Payer: Medicare Other | Admitting: Internal Medicine

## 2017-01-20 DIAGNOSIS — H2511 Age-related nuclear cataract, right eye: Secondary | ICD-10-CM | POA: Diagnosis not present

## 2017-01-29 ENCOUNTER — Other Ambulatory Visit (INDEPENDENT_AMBULATORY_CARE_PROVIDER_SITE_OTHER): Payer: Self-pay | Admitting: Internal Medicine

## 2017-02-06 ENCOUNTER — Telehealth: Payer: Self-pay

## 2017-02-06 ENCOUNTER — Encounter: Payer: Self-pay | Admitting: Internal Medicine

## 2017-02-06 ENCOUNTER — Ambulatory Visit (INDEPENDENT_AMBULATORY_CARE_PROVIDER_SITE_OTHER): Payer: Medicare Other | Admitting: Internal Medicine

## 2017-02-06 ENCOUNTER — Telehealth: Payer: Self-pay | Admitting: Internal Medicine

## 2017-02-06 VITALS — BP 134/80 | HR 80 | Ht 63.5 in | Wt 158.0 lb

## 2017-02-06 DIAGNOSIS — E538 Deficiency of other specified B group vitamins: Secondary | ICD-10-CM | POA: Insufficient documentation

## 2017-02-06 DIAGNOSIS — I10 Essential (primary) hypertension: Secondary | ICD-10-CM | POA: Insufficient documentation

## 2017-02-06 DIAGNOSIS — E210 Primary hyperparathyroidism: Secondary | ICD-10-CM | POA: Insufficient documentation

## 2017-02-06 DIAGNOSIS — M810 Age-related osteoporosis without current pathological fracture: Secondary | ICD-10-CM | POA: Insufficient documentation

## 2017-02-06 DIAGNOSIS — E559 Vitamin D deficiency, unspecified: Secondary | ICD-10-CM

## 2017-02-06 DIAGNOSIS — D509 Iron deficiency anemia, unspecified: Secondary | ICD-10-CM | POA: Insufficient documentation

## 2017-02-06 DIAGNOSIS — E039 Hypothyroidism, unspecified: Secondary | ICD-10-CM | POA: Diagnosis not present

## 2017-02-06 DIAGNOSIS — E785 Hyperlipidemia, unspecified: Secondary | ICD-10-CM | POA: Insufficient documentation

## 2017-02-06 DIAGNOSIS — R7302 Impaired glucose tolerance (oral): Secondary | ICD-10-CM | POA: Insufficient documentation

## 2017-02-06 DIAGNOSIS — Z9989 Dependence on other enabling machines and devices: Secondary | ICD-10-CM | POA: Insufficient documentation

## 2017-02-06 DIAGNOSIS — E213 Hyperparathyroidism, unspecified: Secondary | ICD-10-CM

## 2017-02-06 DIAGNOSIS — I4891 Unspecified atrial fibrillation: Secondary | ICD-10-CM | POA: Insufficient documentation

## 2017-02-06 DIAGNOSIS — G4733 Obstructive sleep apnea (adult) (pediatric): Secondary | ICD-10-CM | POA: Insufficient documentation

## 2017-02-06 LAB — VITAMIN D 25 HYDROXY (VIT D DEFICIENCY, FRACTURES): VITD: 25.93 ng/mL — AB (ref 30.00–100.00)

## 2017-02-06 LAB — TSH: TSH: 3.59 u[IU]/mL (ref 0.35–4.50)

## 2017-02-06 LAB — T4, FREE: Free T4: 1.4 ng/dL (ref 0.60–1.60)

## 2017-02-06 MED ORDER — LEVOTHYROXINE SODIUM 75 MCG PO TABS: 75.0000 ug | ORAL_TABLET | Freq: Every day | ORAL | 2 refills | Status: DC

## 2017-02-06 NOTE — Telephone Encounter (Signed)
Pt said that when the Vitamin D levels come back, to let them know on either their cell # or their daughter's home # [phone]

## 2017-02-06 NOTE — Telephone Encounter (Signed)
Patient request to contact daughter, advised Dr.Gherghe when she receives the results back to place who to call in the note.

## 2017-02-06 NOTE — Patient Instructions (Signed)
Please stop at the lab.  Please continue vitamin D 2000 units for now.  Patient information (Up-to-Date): Collection of a 24-hour urine specimen  - You should collect every drop of urine during each 24-hour period. It does not matter how much or little urine is passed each time, as long as every drop is collected. - Begin the urine collection in the morning after you wake up, after you have emptied your bladder for the first time. - Urinate (empty the bladder) for the first time and flush it down the toilet. Note the exact time (eg, 6:15 AM). You will begin the urine collection at this time. - Collect every drop of urine during the day and night in an empty collection bottle. Store the bottle at room temperature or in the refrigerator. - If you need to have a bowel movement, any urine passed with the bowel movement should be collected. Try not to include feces with the urine collection. If feces does get mixed in, do not try to remove the feces from the urine collection bottle. - Finish by collecting the first urine passed the next morning, adding it to the collection bottle. This should be within ten minutes before or after the time of the first morning void on the first day (which was flushed). In this example, you would try to void between 6:05 and 6:25 on the second day. - If you need to urinate one hour before the final collection time, drink a full glass of water so that you can void again at the appropriate time. If you have to urinate 20 minutes before, try to hold the urine until the proper time. - Please note the exact time of the final collection, even if it is not the same time as when collection began on day 1. - The bottle(s) may be kept at room temperature for a day or two, but should be kept cool or refrigerated for longer periods of time.  Please come back for a follow-up appointment in 3 months

## 2017-02-06 NOTE — Telephone Encounter (Signed)
When we get these labs in, can you place in the note for me to contact her? It wont let me put it in her contact info. Thank you!

## 2017-02-06 NOTE — Progress Notes (Signed)
Patient ID: Teresa Caldwell, female   DOB: September 15, 1941, 76 y.o.   MRN: [id number]    HPI  Teresa Caldwell is a 76 y.o.-year-old female, referred by her cardiologist, Dr. Haroldine Caldwell (previously saw Dr. Delia Caldwell in [facility] for a second opinion for hypercalcemia/hyperparathyroidism. She is seeing endocrinology in [place], [place], Dr. Bobbe Caldwell.  She is here with her husband who offers part of the history.  She and her husband describe that in 08/2016, she started to feel very fatigued, weak, also with decreased appetite, weight loss (20 lbs in last 3 mo). Now feeling a little better in last week.   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 They were following the levels of the calcium and PTH. She mentions that her calcium started to increase in the last 4.5 months.  I reviewed pt's pertinent labs: Lab Results  Component Value Date   CALCIUM 11.1 (H) 01/02/2017  12/22/2016: Calcium 10.8 (8.5-10.1), iCa 5.91 (4.57-5.43) PTH 256 12/17/2016: Calcium 10.4 (8.5-10.1), Mg  06/25/2015: iCa 1.43 (1.12-1.32), PTH 159  She has OP. No DEXA scans available for review.  No fractures but had several falls - 4 in last 1.5 years. Last was 05/2016.  + h/o kidney stones x 3 >> last ~2015 >> had lithotripsy.  + h/o CKD (sees nephrology). Last BUN/Cr: 12/22/2016: 20/1.4, EGFR 37 12/17/2016: 18/1.5, EGFR 34 Lab Results  Component Value Date   BUN 20 01/02/2017   CREATININE 1.37 (H) 01/02/2017   Pt is not on HCTZ. Was on this in the past, up to at least 10/2016 per [place] records.  No h/o vitamin D deficiency. Reviewed vit D levels: 12/17/2016: Vitamin D 22.8 06/25/2015: Vitamin D 19.2 Her cardiologist added a vitamin D 2000 IU/day then.  Pt does not have a FH of hypercalcemia, pituitary tumors. Mother and sister had osteoporosis. She has a sister and her daughter >> ThyCA.  Pt. also has a history of hypothyroidism - on LT4, dose  reduced to 50 mcg daily in 12/2016 as Amiodarone dose was reduced to 100 mg daily. She also stopped Bystolic then.  Reviewed available TSH levels: 12/17/2016: TSH 1.69  They are driving from 5 hours away.  ROS: Constitutional: + weight loss, + fatigue, + hot flushes, + poor sleep, + nocturia Eyes: + blurry vision, no xerophthalmia ENT: no sore throat, no dysphagia/odynophagia, no hoarseness, + hypoacusis Cardiovascular: + CP/+ SOB/no palpitations/+ leg swelling Respiratory: + cough/+ SOB Gastrointestinal: + N/+ V/+ D/+ C, no heartburn Musculoskeletal: + muscle aches/+ joint aches Skin: no rashes,  + easy bruising, + hair loss Neurological: no tremors/numbness/tingling/dizziness, no HA Psychiatric: no depression/anxiety  Patient Active Problem List   Diagnosis Date Noted  . 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 Hypothyroidism 02/06/2017  . 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 Osteoporosis 02/06/2017  . Glucose intolerance (impaired glucose tolerance) 02/06/2017  . Essential hypertension 02/06/2017  . Atrial fibrillation ([place]) 02/06/2017  . OSA on CPAP 02/06/2017  . B12 deficiency 02/06/2017  . Hyperlipidemia 02/06/2017  . IDA (iron deficiency anemia) 02/06/2017   Past Surgical History:  Procedure Laterality Date  . TONSILLECTOMY  1956   . WRIST ARTHROSCOPY Left 1994   Also: - Hysterectomy 1989 - Lithotripsy x3 - Cataract L and R 2018   Social History   Social History  . Marital status: Married    Spouse name: N/A  . Number of children: 5 (1 daughter lives in [place])   Occupational History  .  n/a   Social History Main Topics  . Smoking status: Never Smoker  . Smokeless tobacco: Never Used  . Alcohol use No  . Drug use: No   Current Outpatient Prescriptions on File Prior to Visit  Medication Sig Dispense Refill  . amiodarone (PACERONE) 100 MG tablet Take 100 mg by mouth every morning.    Marland Kitchen amLODipine (NORVASC) 5 MG tablet Take 5 mg by mouth every  evening.    Marland Kitchen apixaban (ELIQUIS) 5 MG TABS tablet Take 5 mg by mouth 2 (two) times daily.    . Cholecalciferol (VITAMIN D-3) 1000 units CAPS Take 2,000 Units by mouth every morning.    . cloNIDine (CATAPRES) 0.1 MG tablet Take 0.1 mg by mouth See admin instructions. TWO TIMES A DAY ONLY IF SYSTOLIC READING IS 371 OR GREATER    . iron polysaccharides (FERREX 150) 150 MG capsule Take 150 mg by mouth every evening.    Marland Kitchen ketorolac (ACULAR) 0.4 % SOLN Place 1 drop into the left eye 4 (four) times daily. For 2 weeks (filled: 12/30/16)    . levothyroxine (SYNTHROID, LEVOTHROID) 50 MCG tablet Take 50 mcg by mouth daily before breakfast.    . lisinopril (PRINIVIL,ZESTRIL) 40 MG tablet Take 40 mg by mouth every morning.     . metoCLOPramide (REGLAN) 10 MG tablet Take 0.5 tablets (5 mg total) by mouth every 6 (six) hours as needed for nausea. 4 tablet 0  . potassium chloride (K-DUR,KLOR-CON) 10 MEQ tablet Take 10 mEq by mouth every evening.    . prednisoLONE acetate (PRED FORTE) 1 % ophthalmic suspension Place 1 drop into the left eye See admin instructions. Four times a day for 14 days then two times a day for 14 days    . traZODone (DESYREL) 50 MG tablet Take 25-50 mg by mouth at bedtime as needed for sleep.    Marland Kitchen trimethoprim-polymyxin b (POLYTRIM) ophthalmic solution Place 1 drop into the left eye See admin instructions. Four times a day for 7 days then 1 drop four times a day starting 3 days before day of surgery    . vitamin B-12 (CYANOCOBALAMIN) 1000 MCG tablet Take 1,000 mcg by mouth daily.     No current facility-administered medications on file prior to visit.    Allergies  Allergen Reactions  . Ciprofloxacin Hives  . Digoxin And Related Hives    And blisters (eyes & lips)  . Sulfa Antibiotics Hives  . Tape Other (See Comments)    Needs tape for SENSITIVE SKIN  . Clindamycin/Lincomycin Rash   Family History  Problem Relation Age of Onset  . Hypertension Mother   . Heart disease Mother    . Diabetes Child   . Hypertension Child   . Thyroid disease Child   . Cancer Child     PE: BP 134/80 (BP Location: Left Arm, Patient Position: Sitting)   Pulse 80   Ht 5' 3.5" (1.613 m)   Wt 158 lb (71.7 kg)   SpO2 95%   BMI 27.55 kg/m  Wt Readings from Last 3 Encounters:  02/06/17 158 lb (71.7 kg)  01/02/17 153 lb (69.4 kg)   Constitutional: overweight, in NAD. No kyphosis. Eyes: PERRLA, EOMI, no exophthalmos ENT: moist mucous membranes, no thyromegaly, no cervical lymphadenopathy Cardiovascular: RRR, No MRG Respiratory: CTA B Gastrointestinal: abdomen soft, NT, ND, BS+ Musculoskeletal: no deformities, strength intact in all 4 Skin: moist, warm, no rashes Neurological: no tremor with outstretched hands, DTR normal in all 4  Assessment: 1. Hypercalcemia/hyperparathyroidism  2. Vit D insufficiency  3. Hypothyroidism  Plan: 1. And 2. Patient has had several instances of elevated calcium, with the highest level being at 11.1. An intact PTH level was also high, at 256 (for a Ca level of 10.8).  - Patient also  has vitamin D deficiency,  with the last level being 22.  - I was able to review records from PCP and her endocrinologist and it appears that her endocrinologist tried to convince the patient to have parathyroidectomy, but she refused. - She does have complications from hypercalcemia: + h/o nephrolithiasis, + osteoporosis, no fractures. No abdominal pain, depression, bone pain. - I discussed with the patient and her husband about the physiology of calcium and parathyroid hormone, and possible side effects from increased PTH, including kidney stones, osteoporosis, abdominal pain, etc.  - We discussed that we need to check whether her hyperparathyroidism is primary (Familial hypercalcemic hypocalciuria or parathyroid adenoma) or secondary (to conditions like: vitamin D deficiency, calcium malabsorption, hypercalciuria, renal insufficiency, etc.). - I discussed with her  that we first need to bring her vitamin D level to normal so we can further investigate the parathyroid status. I explained that in the setting of a low vitamin D, the parathyroid hormone can be elevated, which is not a pathologic finding. However, if the PTH is elevated in the setting of a normal vitamin D (which I suspect), we will further need to investigate her for primary or secondary hyperparathyroidism. - after we normalize the vitamin D level, we'll need to check: calcium level intact PTH ([facility]) Magnesium Phosphorus Vit D 1,25 HO 24h urinary calcium/creatinine ratio - given instructions for urine collection - We discussed possible consequences of hyperparathyroidism: ~1/3 pts will develop complications over 15 years (OP, nephrolithiasis). She already has had these, so I highly recommended to have parathyroidectomy if she is found to have primary hyperparathyroidism. She and her husband agree with this, but they are worried about Eliquis. We discussed about bridging with heparin, but I agree, that this will be a little challenging. - criteria for parathyroid surgery (she needs the ones in bold):  Increased calcium by more than 1 mg/dL above the upper limit of normal  Kidney ds.  Osteoporosis (or Vb fx) Age <40 years old New (2013): High UCa >400 mg/d and increased stone risk by biochemical stone risk analysis Presence of nephrolithiasis or nephrocalcinosis Pt's preference!  - I advised the patient for now to continue the 2000 units of vitamin D and will check a level today. I will advise her about vitamin D supplement dose when the results of the vitamin D level are back. - I will see the patient back in 3 months  3. Hypothyroidism - reportedly dx'ed before starting Amiodarone - On LT4 50 mcg now, decreased from 75 mcg 1.5 mo ago. As her TSH level was 1.69 then, I am afraid that this decrease may have increased her TSH >> will check today  - time spent with the patient and her  husband: 1 hour, of which >50% was spent in obtaining information about her symptoms, reviewing her previous labs, evaluations, and treatments (from Epic, paper referral, Care Everywhere records), counseling her about her condition (please see the discussed topics above), and developing a plan to further investigate and treat it; she had a number of questions which I addressed.  Component     Latest Ref Rng & Units 02/06/2017  VITD     30.00 - 100.00 ng/mL 25.93 (L)  TSH  0.35 - 4.50 uIU/mL 3.59  T4,Free(Direct)     0.60 - 1.60 ng/dL 1.40   Vitamin D level is low, therefore, we will increase her vitamin D supplement from 2000 to 4000 units daily and recheck her tests in 2 months. At that time, I will ask her to perform the urine collection and bring it to the lab. Also, I would like to increase her levothyroxine back to 75 g daily and will recheck the tests in 2 months. Will add the following labs then: Orders Placed This Encounter  Procedures  . Calcium, urine, 24 hour  . Creatinine, urine, 24 hour  . Vitamin D, 25-hydroxy  . TSH  . T4, free  . Phosphorus  . Vitamin D 1,25 dihydroxy  . Parathyroid hormone, intact (no Ca)  . BASIC METABOLIC PANEL WITH GFR  . Magnesium     Philemon Kingdom, MD PhD [facility]

## 2017-02-10 ENCOUNTER — Telehealth: Payer: Self-pay

## 2017-02-10 NOTE — Telephone Encounter (Signed)
-----   Message from Philemon Kingdom, MD sent at 02/10/2017 11:20 AM EDT ----- I called and discussed w/ pt's husband Re: results. Will increase her LT4 dose back to 75 mcg daily. Vitamin D level is low, therefore, we will increase her vitamin D supplement from 2000 to 4000 units daily and recheck her tests in 2 months. At that time, I will ask her to perform the urine collection and bring it to the lab. Will check the rest of the parathyroid labs then.

## 2017-02-10 NOTE — Telephone Encounter (Signed)
Called and spoke with daughter, advised of lab results and scheduled lab appointment. Patient daughter understood and had no questions at this time.

## 2017-02-11 DIAGNOSIS — I129 Hypertensive chronic kidney disease with stage 1 through stage 4 chronic kidney disease, or unspecified chronic kidney disease: Secondary | ICD-10-CM | POA: Diagnosis not present

## 2017-02-11 DIAGNOSIS — R944 Abnormal results of kidney function studies: Secondary | ICD-10-CM | POA: Diagnosis not present

## 2017-02-11 DIAGNOSIS — R808 Other proteinuria: Secondary | ICD-10-CM | POA: Diagnosis not present

## 2017-02-11 DIAGNOSIS — M810 Age-related osteoporosis without current pathological fracture: Secondary | ICD-10-CM | POA: Diagnosis not present

## 2017-02-11 DIAGNOSIS — N281 Cyst of kidney, acquired: Secondary | ICD-10-CM | POA: Diagnosis not present

## 2017-02-11 DIAGNOSIS — I10 Essential (primary) hypertension: Secondary | ICD-10-CM | POA: Diagnosis not present

## 2017-02-11 DIAGNOSIS — D649 Anemia, unspecified: Secondary | ICD-10-CM | POA: Diagnosis not present

## 2017-02-11 DIAGNOSIS — N183 Chronic kidney disease, stage 3 (moderate): Secondary | ICD-10-CM | POA: Diagnosis not present

## 2017-02-12 DIAGNOSIS — R001 Bradycardia, unspecified: Secondary | ICD-10-CM | POA: Diagnosis not present

## 2017-02-12 DIAGNOSIS — I4891 Unspecified atrial fibrillation: Secondary | ICD-10-CM | POA: Diagnosis not present

## 2017-02-17 DIAGNOSIS — N183 Chronic kidney disease, stage 3 (moderate): Secondary | ICD-10-CM | POA: Diagnosis not present

## 2017-02-17 DIAGNOSIS — E876 Hypokalemia: Secondary | ICD-10-CM | POA: Diagnosis not present

## 2017-02-17 DIAGNOSIS — N281 Cyst of kidney, acquired: Secondary | ICD-10-CM | POA: Diagnosis not present

## 2017-02-17 DIAGNOSIS — I10 Essential (primary) hypertension: Secondary | ICD-10-CM | POA: Diagnosis not present

## 2017-02-25 DIAGNOSIS — Z9841 Cataract extraction status, right eye: Secondary | ICD-10-CM | POA: Diagnosis not present

## 2017-02-25 DIAGNOSIS — Z9842 Cataract extraction status, left eye: Secondary | ICD-10-CM | POA: Diagnosis not present

## 2017-02-25 DIAGNOSIS — T378X5S Adverse effect of other specified systemic anti-infectives and antiparasitics, sequela: Secondary | ICD-10-CM | POA: Diagnosis not present

## 2017-03-16 ENCOUNTER — Other Ambulatory Visit (INDEPENDENT_AMBULATORY_CARE_PROVIDER_SITE_OTHER): Payer: Self-pay | Admitting: Internal Medicine

## 2017-03-16 DIAGNOSIS — D509 Iron deficiency anemia, unspecified: Secondary | ICD-10-CM

## 2017-03-16 DIAGNOSIS — M5412 Radiculopathy, cervical region: Secondary | ICD-10-CM | POA: Diagnosis not present

## 2017-03-16 DIAGNOSIS — M9901 Segmental and somatic dysfunction of cervical region: Secondary | ICD-10-CM | POA: Diagnosis not present

## 2017-03-16 DIAGNOSIS — M9903 Segmental and somatic dysfunction of lumbar region: Secondary | ICD-10-CM | POA: Diagnosis not present

## 2017-03-16 DIAGNOSIS — M9902 Segmental and somatic dysfunction of thoracic region: Secondary | ICD-10-CM | POA: Diagnosis not present

## 2017-03-16 DIAGNOSIS — M9905 Segmental and somatic dysfunction of pelvic region: Secondary | ICD-10-CM | POA: Diagnosis not present

## 2017-03-16 MED ORDER — POLYSACCHARIDE IRON COMPLEX 150 MG IRON CAPSULE
ORAL_CAPSULE | ORAL | 3 refills | Status: DC
Start: 2017-03-16 — End: 2018-04-02

## 2017-03-23 DIAGNOSIS — M9903 Segmental and somatic dysfunction of lumbar region: Secondary | ICD-10-CM | POA: Diagnosis not present

## 2017-03-23 DIAGNOSIS — M9905 Segmental and somatic dysfunction of pelvic region: Secondary | ICD-10-CM | POA: Diagnosis not present

## 2017-03-23 DIAGNOSIS — M9902 Segmental and somatic dysfunction of thoracic region: Secondary | ICD-10-CM | POA: Diagnosis not present

## 2017-03-23 DIAGNOSIS — M5412 Radiculopathy, cervical region: Secondary | ICD-10-CM | POA: Diagnosis not present

## 2017-03-23 DIAGNOSIS — M9901 Segmental and somatic dysfunction of cervical region: Secondary | ICD-10-CM | POA: Diagnosis not present

## 2017-03-27 DIAGNOSIS — M9901 Segmental and somatic dysfunction of cervical region: Secondary | ICD-10-CM | POA: Diagnosis not present

## 2017-03-27 DIAGNOSIS — M5412 Radiculopathy, cervical region: Secondary | ICD-10-CM | POA: Diagnosis not present

## 2017-03-27 DIAGNOSIS — M9903 Segmental and somatic dysfunction of lumbar region: Secondary | ICD-10-CM | POA: Diagnosis not present

## 2017-03-27 DIAGNOSIS — M9905 Segmental and somatic dysfunction of pelvic region: Secondary | ICD-10-CM | POA: Diagnosis not present

## 2017-03-27 DIAGNOSIS — M9902 Segmental and somatic dysfunction of thoracic region: Secondary | ICD-10-CM | POA: Diagnosis not present

## 2017-03-31 DIAGNOSIS — M9901 Segmental and somatic dysfunction of cervical region: Secondary | ICD-10-CM | POA: Diagnosis not present

## 2017-03-31 DIAGNOSIS — M5412 Radiculopathy, cervical region: Secondary | ICD-10-CM | POA: Diagnosis not present

## 2017-03-31 DIAGNOSIS — M9902 Segmental and somatic dysfunction of thoracic region: Secondary | ICD-10-CM | POA: Diagnosis not present

## 2017-03-31 DIAGNOSIS — I480 Paroxysmal atrial fibrillation: Secondary | ICD-10-CM | POA: Diagnosis not present

## 2017-03-31 DIAGNOSIS — I10 Essential (primary) hypertension: Secondary | ICD-10-CM | POA: Diagnosis not present

## 2017-03-31 DIAGNOSIS — M9903 Segmental and somatic dysfunction of lumbar region: Secondary | ICD-10-CM | POA: Diagnosis not present

## 2017-03-31 DIAGNOSIS — R5383 Other fatigue: Secondary | ICD-10-CM | POA: Diagnosis not present

## 2017-03-31 DIAGNOSIS — M9905 Segmental and somatic dysfunction of pelvic region: Secondary | ICD-10-CM | POA: Diagnosis not present

## 2017-03-31 DIAGNOSIS — E782 Mixed hyperlipidemia: Secondary | ICD-10-CM | POA: Diagnosis not present

## 2017-04-03 ENCOUNTER — Encounter (INDEPENDENT_AMBULATORY_CARE_PROVIDER_SITE_OTHER): Payer: Self-pay | Admitting: Internal Medicine

## 2017-04-03 DIAGNOSIS — M9903 Segmental and somatic dysfunction of lumbar region: Secondary | ICD-10-CM | POA: Diagnosis not present

## 2017-04-03 DIAGNOSIS — M5412 Radiculopathy, cervical region: Secondary | ICD-10-CM | POA: Diagnosis not present

## 2017-04-03 DIAGNOSIS — M9905 Segmental and somatic dysfunction of pelvic region: Secondary | ICD-10-CM | POA: Diagnosis not present

## 2017-04-03 DIAGNOSIS — M9902 Segmental and somatic dysfunction of thoracic region: Secondary | ICD-10-CM | POA: Diagnosis not present

## 2017-04-03 DIAGNOSIS — M9901 Segmental and somatic dysfunction of cervical region: Secondary | ICD-10-CM | POA: Diagnosis not present

## 2017-04-07 DIAGNOSIS — M9903 Segmental and somatic dysfunction of lumbar region: Secondary | ICD-10-CM | POA: Diagnosis not present

## 2017-04-07 DIAGNOSIS — M5412 Radiculopathy, cervical region: Secondary | ICD-10-CM | POA: Diagnosis not present

## 2017-04-07 DIAGNOSIS — M9901 Segmental and somatic dysfunction of cervical region: Secondary | ICD-10-CM | POA: Diagnosis not present

## 2017-04-07 DIAGNOSIS — M9905 Segmental and somatic dysfunction of pelvic region: Secondary | ICD-10-CM | POA: Diagnosis not present

## 2017-04-07 DIAGNOSIS — M9902 Segmental and somatic dysfunction of thoracic region: Secondary | ICD-10-CM | POA: Diagnosis not present

## 2017-04-10 DIAGNOSIS — M9901 Segmental and somatic dysfunction of cervical region: Secondary | ICD-10-CM | POA: Diagnosis not present

## 2017-04-10 DIAGNOSIS — M9905 Segmental and somatic dysfunction of pelvic region: Secondary | ICD-10-CM | POA: Diagnosis not present

## 2017-04-10 DIAGNOSIS — M5412 Radiculopathy, cervical region: Secondary | ICD-10-CM | POA: Diagnosis not present

## 2017-04-10 DIAGNOSIS — M9902 Segmental and somatic dysfunction of thoracic region: Secondary | ICD-10-CM | POA: Diagnosis not present

## 2017-04-10 DIAGNOSIS — M9903 Segmental and somatic dysfunction of lumbar region: Secondary | ICD-10-CM | POA: Diagnosis not present

## 2017-04-13 ENCOUNTER — Other Ambulatory Visit (INDEPENDENT_AMBULATORY_CARE_PROVIDER_SITE_OTHER): Payer: Medicare Other

## 2017-04-13 DIAGNOSIS — E559 Vitamin D deficiency, unspecified: Secondary | ICD-10-CM | POA: Diagnosis not present

## 2017-04-13 DIAGNOSIS — E213 Hyperparathyroidism, unspecified: Secondary | ICD-10-CM | POA: Diagnosis not present

## 2017-04-13 DIAGNOSIS — E039 Hypothyroidism, unspecified: Secondary | ICD-10-CM

## 2017-04-13 LAB — PHOSPHORUS: Phosphorus: 3 mg/dL (ref 2.3–4.6)

## 2017-04-13 LAB — MAGNESIUM: Magnesium: 2.1 mg/dL (ref 1.5–2.5)

## 2017-04-13 LAB — T4, FREE: Free T4: 1.45 ng/dL (ref 0.60–1.60)

## 2017-04-13 LAB — TSH: TSH: 1.35 u[IU]/mL (ref 0.35–4.50)

## 2017-04-14 LAB — BASIC METABOLIC PANEL WITH GFR
BUN: 26 mg/dL — AB (ref 7–25)
CO2: 21 mmol/L (ref 20–31)
Calcium: 10.4 mg/dL (ref 8.6–10.4)
Chloride: 104 mmol/L (ref 98–110)
Creat: 1.47 mg/dL — ABNORMAL HIGH (ref 0.60–0.93)
GFR, EST AFRICAN AMERICAN: 40 mL/min — AB (ref >=60)
GFR, EST NON AFRICAN AMERICAN: 35 mL/min — AB (ref >=60)
Glucose, Bld: 89 mg/dL (ref 65–99)
POTASSIUM: 4.6 mmol/L (ref 3.5–5.3)
SODIUM: 137 mmol/L (ref 135–146)

## 2017-04-14 LAB — VITAMIN D 25 HYDROXY (VIT D DEFICIENCY, FRACTURES): VITD: 46.04 ng/mL (ref 30.00–100.00)

## 2017-04-14 LAB — CALCIUM, URINE, 24 HOUR
CALCIUM 24HR UR: 180 mg/(24.h) (ref 35–250)
Calcium, Ur: 10 mg/dL

## 2017-04-14 LAB — CREATININE, URINE, 24 HOUR
CREATININE, URINE: 36 mg/dL (ref 20–320)
Creatinine, 24H Ur: 0.65 g/(24.h) (ref 0.63–2.50)

## 2017-04-14 LAB — PARATHYROID HORMONE, INTACT (NO CA): PTH: 76 pg/mL — ABNORMAL HIGH (ref 15–65)

## 2017-04-15 ENCOUNTER — Telehealth: Payer: Self-pay

## 2017-04-15 LAB — VITAMIN D 1,25 DIHYDROXY
VITAMIN D3 1, 25 (OH): 44 pg/mL
Vitamin D 1, 25 (OH)2 Total: 44 pg/mL (ref 18–72)
Vitamin D2 1, 25 (OH)2: <8 pg/mL

## 2017-04-15 NOTE — Telephone Encounter (Signed)
Patient husband called, regarding the lab results,. I advised that some of them were back,. But not all and you would let me know once they all came back and I would call them. Just wanted to let you know so you could see if they were back.   Thank you!

## 2017-04-16 ENCOUNTER — Telehealth: Payer: Self-pay

## 2017-04-16 NOTE — Telephone Encounter (Signed)
Called and LVM for patient to call back to receive lab results.

## 2017-04-16 NOTE — Telephone Encounter (Signed)
Called and discussed lab results with patient and patients husband. No questions at this time, and asked to change appointment date which we did.

## 2017-04-21 DIAGNOSIS — M5412 Radiculopathy, cervical region: Secondary | ICD-10-CM | POA: Diagnosis not present

## 2017-04-21 DIAGNOSIS — M9903 Segmental and somatic dysfunction of lumbar region: Secondary | ICD-10-CM | POA: Diagnosis not present

## 2017-04-21 DIAGNOSIS — M9902 Segmental and somatic dysfunction of thoracic region: Secondary | ICD-10-CM | POA: Diagnosis not present

## 2017-04-21 DIAGNOSIS — M9901 Segmental and somatic dysfunction of cervical region: Secondary | ICD-10-CM | POA: Diagnosis not present

## 2017-04-21 DIAGNOSIS — M9905 Segmental and somatic dysfunction of pelvic region: Secondary | ICD-10-CM | POA: Diagnosis not present

## 2017-04-24 DIAGNOSIS — M9901 Segmental and somatic dysfunction of cervical region: Secondary | ICD-10-CM | POA: Diagnosis not present

## 2017-04-24 DIAGNOSIS — M9903 Segmental and somatic dysfunction of lumbar region: Secondary | ICD-10-CM | POA: Diagnosis not present

## 2017-04-24 DIAGNOSIS — M9902 Segmental and somatic dysfunction of thoracic region: Secondary | ICD-10-CM | POA: Diagnosis not present

## 2017-04-24 DIAGNOSIS — M9905 Segmental and somatic dysfunction of pelvic region: Secondary | ICD-10-CM | POA: Diagnosis not present

## 2017-04-24 DIAGNOSIS — M5412 Radiculopathy, cervical region: Secondary | ICD-10-CM | POA: Diagnosis not present

## 2017-04-28 DIAGNOSIS — M9903 Segmental and somatic dysfunction of lumbar region: Secondary | ICD-10-CM | POA: Diagnosis not present

## 2017-04-28 DIAGNOSIS — M5412 Radiculopathy, cervical region: Secondary | ICD-10-CM | POA: Diagnosis not present

## 2017-04-28 DIAGNOSIS — M9902 Segmental and somatic dysfunction of thoracic region: Secondary | ICD-10-CM | POA: Diagnosis not present

## 2017-04-28 DIAGNOSIS — M9905 Segmental and somatic dysfunction of pelvic region: Secondary | ICD-10-CM | POA: Diagnosis not present

## 2017-04-28 DIAGNOSIS — M9901 Segmental and somatic dysfunction of cervical region: Secondary | ICD-10-CM | POA: Diagnosis not present

## 2017-05-01 DIAGNOSIS — M9901 Segmental and somatic dysfunction of cervical region: Secondary | ICD-10-CM | POA: Diagnosis not present

## 2017-05-01 DIAGNOSIS — M9905 Segmental and somatic dysfunction of pelvic region: Secondary | ICD-10-CM | POA: Diagnosis not present

## 2017-05-01 DIAGNOSIS — M9903 Segmental and somatic dysfunction of lumbar region: Secondary | ICD-10-CM | POA: Diagnosis not present

## 2017-05-01 DIAGNOSIS — M5412 Radiculopathy, cervical region: Secondary | ICD-10-CM | POA: Diagnosis not present

## 2017-05-01 DIAGNOSIS — M9902 Segmental and somatic dysfunction of thoracic region: Secondary | ICD-10-CM | POA: Diagnosis not present

## 2017-05-05 DIAGNOSIS — M9905 Segmental and somatic dysfunction of pelvic region: Secondary | ICD-10-CM | POA: Diagnosis not present

## 2017-05-05 DIAGNOSIS — M9902 Segmental and somatic dysfunction of thoracic region: Secondary | ICD-10-CM | POA: Diagnosis not present

## 2017-05-05 DIAGNOSIS — M9903 Segmental and somatic dysfunction of lumbar region: Secondary | ICD-10-CM | POA: Diagnosis not present

## 2017-05-05 DIAGNOSIS — M9901 Segmental and somatic dysfunction of cervical region: Secondary | ICD-10-CM | POA: Diagnosis not present

## 2017-05-05 DIAGNOSIS — M5412 Radiculopathy, cervical region: Secondary | ICD-10-CM | POA: Diagnosis not present

## 2017-05-07 ENCOUNTER — Other Ambulatory Visit (INDEPENDENT_AMBULATORY_CARE_PROVIDER_SITE_OTHER): Payer: Self-pay | Admitting: Internal Medicine

## 2017-05-07 MED ORDER — TRAZODONE 50 MG TABLET
50.0000 mg | ORAL_TABLET | Freq: Every evening | ORAL | 3 refills | Status: DC
Start: 2017-05-07 — End: 2018-06-04

## 2017-05-07 NOTE — Telephone Encounter (Signed)
Patient took half a pill in the beginning but in the last month she started taking a whole pill and states it works better for her.

## 2017-05-08 ENCOUNTER — Ambulatory Visit: Payer: Medicare Other | Admitting: Internal Medicine

## 2017-05-08 DIAGNOSIS — M9903 Segmental and somatic dysfunction of lumbar region: Secondary | ICD-10-CM | POA: Diagnosis not present

## 2017-05-08 DIAGNOSIS — M9902 Segmental and somatic dysfunction of thoracic region: Secondary | ICD-10-CM | POA: Diagnosis not present

## 2017-05-08 DIAGNOSIS — M5412 Radiculopathy, cervical region: Secondary | ICD-10-CM | POA: Diagnosis not present

## 2017-05-08 DIAGNOSIS — M9905 Segmental and somatic dysfunction of pelvic region: Secondary | ICD-10-CM | POA: Diagnosis not present

## 2017-05-08 DIAGNOSIS — M9901 Segmental and somatic dysfunction of cervical region: Secondary | ICD-10-CM | POA: Diagnosis not present

## 2017-05-12 DIAGNOSIS — M5412 Radiculopathy, cervical region: Secondary | ICD-10-CM | POA: Diagnosis not present

## 2017-05-12 DIAGNOSIS — M9905 Segmental and somatic dysfunction of pelvic region: Secondary | ICD-10-CM | POA: Diagnosis not present

## 2017-05-12 DIAGNOSIS — M9903 Segmental and somatic dysfunction of lumbar region: Secondary | ICD-10-CM | POA: Diagnosis not present

## 2017-05-12 DIAGNOSIS — M9901 Segmental and somatic dysfunction of cervical region: Secondary | ICD-10-CM | POA: Diagnosis not present

## 2017-05-12 DIAGNOSIS — M9902 Segmental and somatic dysfunction of thoracic region: Secondary | ICD-10-CM | POA: Diagnosis not present

## 2017-05-14 ENCOUNTER — Encounter: Payer: Self-pay | Admitting: Internal Medicine

## 2017-05-14 ENCOUNTER — Ambulatory Visit (INDEPENDENT_AMBULATORY_CARE_PROVIDER_SITE_OTHER): Payer: Medicare Other | Admitting: Internal Medicine

## 2017-05-14 VITALS — BP 132/82 | HR 75 | Ht 64.0 in | Wt 157.0 lb

## 2017-05-14 DIAGNOSIS — E213 Hyperparathyroidism, unspecified: Secondary | ICD-10-CM | POA: Diagnosis not present

## 2017-05-14 DIAGNOSIS — M810 Age-related osteoporosis without current pathological fracture: Secondary | ICD-10-CM

## 2017-05-14 DIAGNOSIS — E559 Vitamin D deficiency, unspecified: Secondary | ICD-10-CM

## 2017-05-14 DIAGNOSIS — E039 Hypothyroidism, unspecified: Secondary | ICD-10-CM

## 2017-05-14 NOTE — Patient Instructions (Signed)
Please continue vitamin D 4000 units daily.  Please return in 6 months.  Stop at the lab to leave the urine jug.

## 2017-05-14 NOTE — Progress Notes (Addendum)
Patient ID: Teresa Caldwell, female   DOB: 1941-02-14, 76 y.o.   MRN: [id number]    HPI  Teresa Caldwell is a 76 y.o.-year-old female, referred by her cardiologist, Dr. Haroldine Laws (previously saw Dr. Delia Chimes in [facility]), for a second opinion for hypercalcemia/hyperparathyroidism. She is seeing endocrinology in [place], [place], Dr. Bobbe Medico, now retiring. She would want to continue to follow up with me. She is here with her husband who offers part of the history.  Reviewed hx: She and her husband describe that in 08/2016, she started to feel very fatigued, weak, also with decreased appetite, weight loss (20 lbs in 3 mo before our first appt).   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 They were following the levels of the calcium and PTH. She mentions that her calcium started to increase in Fall 2017. She refused parathyroid surgery before.  I reviewed pt's pertinent labs: Lab Results  Component Value Date   PTH 76 (H) 04/13/2017   CALCIUM 10.4 04/13/2017   CALCIUM 11.1 (H) 01/02/2017  12/22/2016: Calcium 10.8 (8.5-10.1), iCa 5.91 (4.57-5.43) PTH 256 12/17/2016: Calcium 10.4 (8.5-10.1), Mg  06/25/2015: iCa 1.43 (1.12-1.32), PTH 159  She has OP. No DEXAs available for review.  No fractures but had several falls. Last was 05/2016.  + h/o kidney stones x 3 >> last ~2015 >> had litotripsy.  + h/o CKD (sees nephrology). Last BUN/Cr: Lab Results  Component Value Date   BUN 26 (H) 04/13/2017   BUN 20 01/02/2017   CREATININE 1.47 (H) 04/13/2017   CREATININE 1.37 (H) 01/02/2017  12/22/2016: 20/1.4, EGFR 37 12/17/2016: 18/1.5, EGFR 34  Pt is not on HCTZ. Was on this in the past, up to at least 10/2016 per [place] records.  She also has a h/o vitamin D deficiency. Reviewed vit D levels: Lab Results  Component Value Date   VD25OH 46.04 04/13/2017   VD25OH 25.93 (L) 02/06/2017  12/17/2016: Vitamin D 22.8 06/25/2015:  Vitamin D 19.2 Her cardiologist added a vitamin D 2000 IU/day >> we increased to 4000 IU in 01/2017 >> subsequent vitamin D was normal.  Pt does not have a FH of hypercalcemia, pituitary tumors. Mother and sister had osteoporosis. She has a sister and her daughter >> ThyCA.  Pt. also has a history of hypothyroidism  - on LT4, dose reduced to 50 mcg in 12/2016 when Amiodarone dose was reduced to 100 mg daily. She also stopped Bystolic then. At last visit, we increased the dose to 75 mcg daily.  Pt mentions she feels much better after she increased vitamin D and Levothyroxine.  Reviewed available TSH levels: Lab Results  Component Value Date   TSH 1.35 04/13/2017   TSH 3.59 02/06/2017   FREET4 1.45 04/13/2017   FREET4 1.40 02/06/2017  12/17/2016: TSH 1.69  She and her husband are driving from 5 hours away to come see their daughter.  ROS: Constitutional: no weight gain/no weight loss, + fatigue, no subjective hyperthermia, no subjective hypothermia Eyes: no blurry vision, no xerophthalmia ENT: no sore throat, no nodules palpated in throat, no dysphagia, no odynophagia, no hoarseness Cardiovascular: no CP/no SOB/no palpitations/no leg swelling Respiratory: no cough/no SOB/no wheezing Gastrointestinal: no N/no V/no D/no C/no acid reflux Musculoskeletal: no muscle aches/no joint aches Skin: + rash >> white papules in naso-labial folds, no hair loss Neurological: no tremors/no numbness/no tingling/no dizziness  I reviewed pt's medications, allergies, PMH, social hx, family hx, and changes were documented  in the history of present illness. Otherwise, unchanged from my initial visit note.  Patient Active Problem List   Diagnosis Date Noted  . Osteoporosis 02/06/2017    Priority: High  . 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 Hypothyroidism 02/06/2017  . Hyperparathyroidism ([place]) 02/06/2017  . Glucose intolerance (impaired glucose tolerance) 02/06/2017  . Essential hypertension  02/06/2017  . Atrial fibrillation ([place]) 02/06/2017  . OSA on CPAP 02/06/2017  . B12 deficiency 02/06/2017  . Hyperlipidemia 02/06/2017  . IDA (iron deficiency anemia) 02/06/2017   Past Surgical History:  Procedure Laterality Date  . TONSILLECTOMY  1956   . WRIST ARTHROSCOPY Left 1994   Also: - Hysterectomy 1989 - Lithotripsy x3 - Cataract L and R 2018   Social History   Social History  . Marital status: Married    Spouse name: N/A  . Number of children: 5 (1 daughter lives in [place])   Occupational History  . n/a   Social History Main Topics  . Smoking status: Never Smoker  . Smokeless tobacco: Never Used  . Alcohol use No  . Drug use: No   Current Outpatient Prescriptions on File Prior to Visit  Medication Sig Dispense Refill  . amiodarone (PACERONE) 100 MG tablet Take 100 mg by mouth every morning.    Marland Kitchen amLODipine (NORVASC) 5 MG tablet Take 5 mg by mouth every evening.    Marland Kitchen apixaban (ELIQUIS) 5 MG TABS tablet Take 5 mg by mouth 2 (two) times daily.    . Cholecalciferol (VITAMIN D-3) 1000 units CAPS Take 2,000 Units by mouth every morning.    . cloNIDine (CATAPRES) 0.1 MG tablet Take 0.1 mg by mouth See admin instructions. TWO TIMES A DAY ONLY IF SYSTOLIC READING IS 462 OR GREATER    . iron polysaccharides (FERREX 150) 150 MG capsule Take 150 mg by mouth every evening.    Marland Kitchen ketorolac (ACULAR) 0.4 % SOLN Place 1 drop into the left eye 4 (four) times daily. For 2 weeks (filled: 12/30/16)    . levothyroxine (SYNTHROID, LEVOTHROID) 75 MCG tablet Take 1 tablet (75 mcg total) by mouth daily. 60 tablet 2  . lisinopril (PRINIVIL,ZESTRIL) 40 MG tablet Take 40 mg by mouth every morning.     . metoCLOPramide (REGLAN) 10 MG tablet Take 0.5 tablets (5 mg total) by mouth every 6 (six) hours as needed for nausea. 4 tablet 0  . potassium chloride (K-DUR,KLOR-CON) 10 MEQ tablet Take 10 mEq by mouth every evening.    . prednisoLONE acetate (PRED FORTE) 1 % ophthalmic suspension Place  1 drop into the left eye See admin instructions. Four times a day for 14 days then two times a day for 14 days    . traZODone (DESYREL) 50 MG tablet Take 25-50 mg by mouth at bedtime as needed for sleep.    Marland Kitchen trimethoprim-polymyxin b (POLYTRIM) ophthalmic solution Place 1 drop into the left eye See admin instructions. Four times a day for 7 days then 1 drop four times a day starting 3 days before day of surgery    . vitamin B-12 (CYANOCOBALAMIN) 1000 MCG tablet Take 1,000 mcg by mouth daily.     No current facility-administered medications on file prior to visit.    Allergies  Allergen Reactions  . Ciprofloxacin Hives  . Digoxin And Related Hives    And blisters (eyes & lips)  . Sulfa Antibiotics Hives  . Tape Other (See Comments)    Needs tape for SENSITIVE SKIN  . Clindamycin/Lincomycin Rash  Family History  Problem Relation Age of Onset  . Hypertension Mother   . Heart disease Mother   . Diabetes Child   . Hypertension Child   . Thyroid disease Child   . Cancer Child     PE: BP 132/82 (BP Location: Left Arm, Patient Position: Sitting)   Pulse 75   Ht 5' 4"  (1.626 m)   Wt 157 lb (71.2 kg)   SpO2 96%   BMI 26.95 kg/m  Wt Readings from Last 3 Encounters:  05/14/17 157 lb (71.2 kg)  02/06/17 158 lb (71.7 kg)  01/02/17 153 lb (69.4 kg)   Constitutional: overweight, in NAD. No kyphosis. Eyes: PERRLA, EOMI, no exophthalmos ENT: moist mucous membranes, no thyromegaly, no cervical lymphadenopathy Cardiovascular: RRR, No MRG Respiratory: CTA B Gastrointestinal: abdomen soft, NT, ND, BS+ Musculoskeletal: no deformities, strength intact in all 4 Skin: moist, warm, no rashes Neurological: no tremor with outstretched hands, DTR normal in all 4  Assessment: 1. Hypercalcemia/hyperparathyroidism - She does have complications from hypercalcemia: + h/o nephrolithiasis, + osteoporosis, no fractures. No abdominal pain, depression, bone pain.  2. Vit D insufficiency  3.  Hypothyroidism  Plan: 1. And 2. Before our first visit, patient has had several instances of elevated calcium, with the highest level being at 11.1. An intact PTH level was also high, at 256 (for a Ca level of 10.8). At last Check, in 03/2017, her calcium returned at the upper limit of normal and the PTH is much lower than before, at 76. Of note, 2 months before these labs, her vitamin D was slightly low and we increased her dose from 2000 units to 4000 units daily. A new vitamin D level in 03/2017 was perfect. A 24-hour urine calcium level was normal. - She is also feeling much better. - At this visit, we reviewed all her labs with her and her husband. They appeared much improved as her vitamin D level improved so we decided to just follow the levels expectantly, without an intervention. She does meet criteria for surgery for primary hyperparathyroidism (see below), however, we have to be careful since she is on Eliquis and she will need to be bridged with heparin in case she needs to surgery. He does appear that her endocrinologist In [place] tried to convince her to have parathyroidectomy in the past but she refused... -  criteria for parathyroid surgery (she has the ones in bold):  Increased calcium by more than 1 mg/dL above the upper limit of normal  Kidney ds.  Osteoporosis (or Vb fx) Age <68 years old New (2013): High UCa >400 mg/d and increased stone risk by biochemical stone risk analysis Presence of nephrolithiasis or nephrocalcinosis Pt's preference!  - I advised the patient for now to continue the 4000 units of vitamin D and we will repeat her level at next visit.  - She brought another 24-hour urine collection for calcium which we will analyze -  - I will see the patient back in 6 months  3. Hypothyroidism - reportedly dx'ed before starting Amiodarone - good control on 75 mcg daily of LT4 per review of the last TFTs in 03/2017.  - will recheck TFT levels at next  visit  Component     Latest Ref Rng & Units 05/14/2017  Calcium, Ur     Not estab mg/dL 8  Calcium, 24 hour urine     35 - 250 mg/24 h 192  Creatinine, Urine     20 - 320 mg/dL 24  Creatinine, 24H Ur     0.63 - 2.50 g/24 h 0.58 (L)  Normal UCa with slightly low Cr.  Philemon Kingdom, MD PhD [facility]

## 2017-05-15 ENCOUNTER — Telehealth: Payer: Self-pay

## 2017-05-15 LAB — CREATININE, URINE, 24 HOUR
CREATININE 24H UR: 0.58 g/(24.h) — AB (ref 0.63–2.50)
CREATININE, URINE: 24 mg/dL (ref 20–320)

## 2017-05-15 LAB — CALCIUM, URINE, 24 HOUR
Calcium, 24 hour urine: 192 mg/24 h (ref 35–250)
Calcium, Ur: 8 mg/dL

## 2017-05-15 NOTE — Telephone Encounter (Signed)
LVM, gave lab results. Gave call back number if any questions or concerns.  

## 2017-05-15 NOTE — Telephone Encounter (Signed)
-----   Message from Philemon Kingdom, MD sent at 05/15/2017 11:44 AM EDT ----- Teresa Caldwell, can you please call pt: Urine calcium is normal. I will see her back in 6 mo, as planned.

## 2017-05-22 DIAGNOSIS — M9902 Segmental and somatic dysfunction of thoracic region: Secondary | ICD-10-CM | POA: Diagnosis not present

## 2017-05-22 DIAGNOSIS — M9905 Segmental and somatic dysfunction of pelvic region: Secondary | ICD-10-CM | POA: Diagnosis not present

## 2017-05-22 DIAGNOSIS — M5412 Radiculopathy, cervical region: Secondary | ICD-10-CM | POA: Diagnosis not present

## 2017-05-22 DIAGNOSIS — M9903 Segmental and somatic dysfunction of lumbar region: Secondary | ICD-10-CM | POA: Diagnosis not present

## 2017-05-22 DIAGNOSIS — M9901 Segmental and somatic dysfunction of cervical region: Secondary | ICD-10-CM | POA: Diagnosis not present

## 2017-05-26 ENCOUNTER — Other Ambulatory Visit (INDEPENDENT_AMBULATORY_CARE_PROVIDER_SITE_OTHER): Payer: Self-pay | Admitting: Internal Medicine

## 2017-05-26 DIAGNOSIS — I10 Essential (primary) hypertension: Secondary | ICD-10-CM

## 2017-05-26 MED ORDER — AMLODIPINE 5 MG TABLET
5.0000 mg | ORAL_TABLET | Freq: Every day | ORAL | 11 refills | Status: DC
Start: 2017-05-26 — End: 2018-09-06

## 2017-06-09 DIAGNOSIS — M9902 Segmental and somatic dysfunction of thoracic region: Secondary | ICD-10-CM | POA: Diagnosis not present

## 2017-06-09 DIAGNOSIS — M9903 Segmental and somatic dysfunction of lumbar region: Secondary | ICD-10-CM | POA: Diagnosis not present

## 2017-06-09 DIAGNOSIS — M5412 Radiculopathy, cervical region: Secondary | ICD-10-CM | POA: Diagnosis not present

## 2017-06-09 DIAGNOSIS — M9905 Segmental and somatic dysfunction of pelvic region: Secondary | ICD-10-CM | POA: Diagnosis not present

## 2017-06-09 DIAGNOSIS — M9901 Segmental and somatic dysfunction of cervical region: Secondary | ICD-10-CM | POA: Diagnosis not present

## 2017-06-19 NOTE — Telephone Encounter (Signed)
error 

## 2017-06-30 DIAGNOSIS — M5412 Radiculopathy, cervical region: Secondary | ICD-10-CM | POA: Diagnosis not present

## 2017-06-30 DIAGNOSIS — M9903 Segmental and somatic dysfunction of lumbar region: Secondary | ICD-10-CM | POA: Diagnosis not present

## 2017-06-30 DIAGNOSIS — M9901 Segmental and somatic dysfunction of cervical region: Secondary | ICD-10-CM | POA: Diagnosis not present

## 2017-06-30 DIAGNOSIS — M9905 Segmental and somatic dysfunction of pelvic region: Secondary | ICD-10-CM | POA: Diagnosis not present

## 2017-06-30 DIAGNOSIS — M9902 Segmental and somatic dysfunction of thoracic region: Secondary | ICD-10-CM | POA: Diagnosis not present

## 2017-07-13 ENCOUNTER — Ambulatory Visit (INDEPENDENT_AMBULATORY_CARE_PROVIDER_SITE_OTHER): Payer: Medicare Other | Admitting: Internal Medicine

## 2017-07-13 ENCOUNTER — Encounter (INDEPENDENT_AMBULATORY_CARE_PROVIDER_SITE_OTHER): Payer: Self-pay | Admitting: Internal Medicine

## 2017-07-13 ENCOUNTER — Ambulatory Visit (INDEPENDENT_AMBULATORY_CARE_PROVIDER_SITE_OTHER): Payer: Medicare Other

## 2017-07-13 ENCOUNTER — Encounter (INDEPENDENT_AMBULATORY_CARE_PROVIDER_SITE_OTHER): Payer: Self-pay

## 2017-07-13 VITALS — BP 136/82 | HR 81 | Ht 64.5 in | Wt 161.2 lb

## 2017-07-13 VITALS — BP 136/82 | HR 81 | Ht 64.5 in | Wt 161.4 lb

## 2017-07-13 DIAGNOSIS — E213 Hyperparathyroidism, unspecified: Principal | ICD-10-CM

## 2017-07-13 DIAGNOSIS — E039 Hypothyroidism, unspecified: Secondary | ICD-10-CM

## 2017-07-13 DIAGNOSIS — R5383 Other fatigue: Secondary | ICD-10-CM

## 2017-07-13 LAB — COMPREHENSIVE METABOLIC PANEL, NON-FASTING
ALBUMIN: 4.1 g/dL (ref 3.4–5.0)
ALKALINE PHOSPHATASE: 108 U/L (ref 45–117)
ALT (SGPT): 20 U/L (ref 13–56)
AST (SGOT): 13 U/L — ABNORMAL LOW (ref 15–37)
BILIRUBIN, TOTAL: 0.5 mg/dL (ref 0.2–1.0)
BUN: 31 mg/dL — ABNORMAL HIGH (ref 7–18)
CALCIUM: 10.4 mg/dL — ABNORMAL HIGH (ref 8.5–10.1)
CHLORIDE: 104 mmol/L (ref 98–107)
CO2 TOTAL: 24 mmol/L (ref 21–32)
CREATININE: 1.5 mg/dL — ABNORMAL HIGH (ref 0.6–1.0)
EGFR AA: 41
EGFR NON-AFRICAN AMERICAN: 34
GLUCOSE: 101 mg/dL (ref 74–106)
POTASSIUM: 4.5 mmol/L (ref 3.5–5.1)
SODIUM: 137 mmol/L (ref 136–145)
TOTAL PROTEIN: 7.7 g/dL (ref 6.4–8.2)

## 2017-07-13 LAB — THYROPEROXIDASE (TPO) ANTIBODIES, SERUM: THYROID PEROXIDASE AB: <28 U/mL

## 2017-07-13 LAB — VITAMIN D 25 HYDROXY: VITAMIN D 25 HYDROXY: 43.3 ng/mL (ref 30.0–100.0)

## 2017-07-13 LAB — THYROXINE, FREE (FREE T4): THYROXINE, FREE (FREE T4): 1.8 ng/dL (ref 0.9–1.8)

## 2017-07-13 LAB — MAGNESIUM: MAGNESIUM: 2.3 mg/dL (ref 1.6–2.6)

## 2017-07-13 LAB — THYROID STIMULATING HORMONE (SENSITIVE TSH): TSH: 1.07 u[IU]/mL (ref 0.350–5.550)

## 2017-07-13 LAB — PHOSPHORUS: PHOSPHORUS: 2.9 mg/dL (ref 2.5–4.9)

## 2017-07-13 LAB — PARATHYROID HORMONE (PTH): INTACT PTH: 210 pg/mL — ABNORMAL HIGH (ref 18–80)

## 2017-07-13 NOTE — Progress Notes (Signed)
ENDOCRINOLOGY, MEDICAL STAFF OFFICE BLDG  [address]  [place] [address]       Name: Cynthia Barrera MRN:  [id number]   Date: 07/13/2017 Age: 76 y.o.       Chief Complaint: Hyperparathyroidism and Hypothyroidism    Summary: Miss Mourer is a 58 y old female with a hx of primary hyperparathyroidism and hypothyroidism.    Interval history:  -Her last visit with Dr. 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 Raquel Sarna. She mentions that her calcium started to increase in Fall 2017. She refused parathyroid surgery before.    I reviewed pt's pertinent labs:  Lab Results   Component Value Date   PTH 76 (H) 04/13/2017   CALCIUM 10.4 04/13/2017   CALCIUM 11.1 (H) 01/02/2017   12/22/2016: Calcium 10.8 (8.5-10.1), iCa 5.91 (4.57-5.43) PTH 256  12/17/2016: Calcium 10.4 (8.5-10.1), Mg   06/25/2015: iCa 1.43 (1.12-1.32), PTH 159    -+ h/o kidney stones x 3 >> last ~2015 >> had litotripsy.    + h/o CKD (sees nephrology). Last BUN/Cr:  Lab Results   Component Value Date   BUN 26 (H) 04/13/2017   BUN 20 01/02/2017   CREATININE 1.47 (H) 04/13/2017   CREATININE 1.37 (H) 01/02/2017   12/22/2016: 20/1.4, EGFR 37  12/17/2016: 18/1.5, EGFR 34    Pt is not on HCTZ    No DEXAs available for review.   -she also has a hx of osteoporosis    ROS :  Fracture History: no  Falls: yes  Height loss: no  Calcium intake: no  Vitamin D intake: yes  Weight bearing exercise: no  Nephrolithiasis: yes  Family History of high calcium or parathyroid problems: yes. Mother and sisters have osteoporosis  Gastric Bypass surgery: no    #Hypothyroidism: She takes 50 mcg of synthroid q daily    #Vitamin D deficiency: She also has a h/o vitamin D deficiency. Reviewed vit D levels:  Lab Results    Component Value Date   VD25OH 46.04 04/13/2017   VD25OH 25.93 (L) 02/06/2017   12/17/2016: Vitamin D 22.8  06/25/2015: Vitamin D 19.2  She is currently on 4000 IU of vitamin D3 started in 01/2017 >> subsequent vitamin D was normal.    Pt does not have a FH of hypercalcemia, pituitary tumors. Mother and sister had osteoporosis.     Past Medical History  Past Medical History:   Diagnosis Date   . Atrial fibrillation (CMS HCC)    . Hypertension    . Kidney stones    . Thyroid disease         Past Surgical History:   Procedure Laterality Date   . HX CYST INCISION AND DRAINAGE      about 2000   . HX PARTIAL HYSTERECTOMY     . HX TONSIL AND ADENOIDECTOMY     . HX TUBAL LIGATION     . HX WRIST FRACTURE TX Left     about 2004             Current Outpatient Prescriptions   Medication Sig   . amiodarone (PACERONE) 100 mg Oral Tablet Take 100 mg by mouth Once a day   . amLODIPine (NORVASC) 5  mg Oral Tablet Take 1 Tab (5 mg total) by mouth Once a day   . apixaban (ELIQUIS) 5 mg Oral Tablet Take 5 mg by mouth Twice daily   . cholecalciferol, vitamin D3, 1,000 unit Oral Tablet Take 2,000 Units by mouth Twice daily    . cloNIDine HCl (CATAPRES) 0.1 mg Oral Tablet Take 0.1 mg by mouth Twice per day as needed   . cyanocobalamin (VITAMIN B 12) 1,000 mcg Oral Tablet Take 1,000 mcg by mouth Once a day   . levothyroxine (SYNTHROID) 50 mcg Oral Tablet Take 50 mcg by mouth Every morning   . lisinopril (PRINIVIL) 40 mg Oral Tablet TAKE ONE TABLET BY MOUTH ONCE DAILY   . polysaccharide iron complex (FERREX 150) 150 mg iron Oral Capsule TAKE ONE CAPSULE BY MOUTH ONCE DAILY   . potassium chloride (K-DUR) 10 mEq Oral Tab Sust.Rel. Particle/Crystal Take 10 mEq by mouth Once a day   . traZODone (DESYREL) 50 mg Oral Tablet Take 1 Tab (50 mg total) by mouth Every night     Allergies   Allergen Reactions   . Bactrim [Sulfamethoxazole-Trimethoprim]    . Ciprofloxacin    . Digoxin    . Plaquenil [Hydroxychloroquine]    . Simvastatin        Review of  Systems  Other than ROS in the HPI, all other systems were negative.    Examination:  BP 136/82  Pulse 81  Ht 1.638 m (5' 4.5")  Wt 73.2 kg (161 lb 6.4 oz)  LMP  (Exact Date)  SpO2 94%  Breastfeeding? No  BMI 27.28 kg/m2  General: appears in good health  Eyes: Conjunctiva clear.  HENT:TM's Clear.   Neck: supple, symmetrical, trachea midline  Lungs: Clear to auscultation bilaterally.   Cardiovascular: regular rate and rhythm  Abdomen: Soft, non-tender  Genito-urinary: not examined  Extremities: No cyanosis or edema  Skin: Skin warm and dry  Neurologic: Grossly normal  Lymphatics: No lymphadenopathy  Psychiatric: Normal    Data reviewed:  Reviewed  Assessment and Plan:  #Primary Hyperparathyroidism: Patient has had several instances of elevated calcium, with the highest level being at 11.1. An intact PTH level was also high, at 256 (for a Ca level of 10.8). At last Check, in 03/2017, her calcium returned at the upper limit of normal and the PTH is much lower than before, at 76. Of note, 2 months before these labs, her vitamin D was slightly low and her dose of Vitamin D3 was increased to 4000 units daily. A new vitamin D level in 03/2017 was good. A 24-hour urine calcium level was normal.    She does meet criteria for surgery for primary hyperparathyroidism on the basis of osteoporosis, CKD and recurrent kidney stones, however she refused surgery on multiple occassions in the past.    - criteria for parathyroid surgery  Increased calcium by more than 1 mg/dL above the upper limit of normal   Kidney ds.   Osteoporosis (or Vb fx)  Age <77 years old  New (2013):  High UCa >400 mg/d and increased stone risk by biochemical stone risk analysis  Presence of nephrolithiasis or nephrocalcinosis  Pt's preference  - I advised the patient for now to continue the 4000 units of vitamin D and I will repeat her blood work today: CMP, vitamin D level, magnesium, phosphorus and  PTH hormone level  - check DXA scan.T score of  -2.8 in the left hip area per Dr. Shon Millet note in the past.      #  Hypothyroidism:  -continue synthroid 50 mcg q daily  -check TSH and free T4 today  -check TPO antibody today    1. Hyperparathyroidism (CMS [place])    2. Hypothyroidism        Orders Placed This Encounter   . DEXA BONE DENSITOMETRY   . COMPREHENSIVE METABOLIC PANEL, NON-FASTING   . Magnesium   . Phosphorus   . VITAMIN D 25 HYDROXY   . PTH - Intact   . THYROID STIMULATING HORMONE (SENSITIVE TSH)   . Free T4   . THYROPEROXIDASE (TPO) ANTIBODIES, SERUM           Annette Stable, MD  Assistant Professor  Endocrinology Diabetes and Metabolism

## 2017-07-13 NOTE — Progress Notes (Signed)
INTERNAL MED, MEDICAL STAFF OFFICE [facility]LDG  [address]  [place] [address]  [place]  Date of Service: 07/13/2017    Chief Complaint:   Chief Complaint   Patient presents with   . Fatigue       History  HPI  Had catatrt surgery done in [facility] which helped some but not as much as expected.     Energy slowly improving since increase in vit d less grouch then prior also. Increased the vit d to 4000 iu in may of this year    Saw endo at [place] [facility] dr Cruzita Lederer, Salena Saner 05/14/17 PTH better controlled on vit d 4000 / day cal has decreased does meet criteria for surgery but pt not wish it     Current Outpatient Prescriptions   Medication Sig   . amiodarone (PACERONE) 100 mg Oral Tablet Take 100 mg by mouth Once a day   . amLODIPine (NORVASC) 5 mg Oral Tablet Take 1 Tab (5 mg total) by mouth Once a day   . apixaban (ELIQUIS) 5 mg Oral Tablet Take 5 mg by mouth Twice daily   . cholecalciferol, vitamin D3, 1,000 unit Oral Tablet Take 2,000 Units by mouth Twice daily    . cloNIDine HCl (CATAPRES) 0.1 mg Oral Tablet Take 0.1 mg by mouth Twice per day as needed   . cyanocobalamin (VITAMIN B 12) 1,000 mcg Oral Tablet Take 1,000 mcg by mouth Once a day   . levothyroxine (SYNTHROID) 50 mcg Oral Tablet Take 50 mcg by mouth Every morning   . lisinopril (PRINIVIL) 40 mg Oral Tablet TAKE ONE TABLET BY MOUTH ONCE DAILY   . polysaccharide iron complex (FERREX 150) 150 mg iron Oral Capsule TAKE ONE CAPSULE BY MOUTH ONCE DAILY   . potassium chloride (K-DUR) 10 mEq Oral Tab Sust.Rel. Particle/Crystal Take 10 mEq by mouth Once a day   . traZODone (DESYREL) 50 mg Oral Tablet Take 1 Tab (50 mg total) by mouth Every night       Review of Systems    Examination  Vitals: BP 136/82  Pulse 81  Ht 1.638 m (5' 4.5")  Wt 73.1 kg (161 lb 3.2 oz)  SpO2 94%  Breastfeeding? No  BMI 27.24 kg/m2  Physical Exam   Constitutional: She appears well-developed and well-nourished.   Eyes: Pupils are equal, round, and reactive  to light.   Neck: Neck supple.   Cardiovascular: Normal rate, regular rhythm and normal heart sounds.    No murmur heard.  Pulmonary/Chest: Effort normal and breath sounds normal. She has no wheezes.   Musculoskeletal: She exhibits edema (2+ ankles bilat).     Ortho Exam    Results  CBC  Diff   Lab Results   Component Value Date/Time    WBC 7.5 12/25/2016 04:37 PM    HGB 12.1 12/25/2016 04:37 PM    HCT 36.9 12/25/2016 04:37 PM    PLTCNT 264 12/25/2016 04:37 PM    RBC 4.41 12/25/2016 04:37 PM    MCV 83.7 12/25/2016 04:37 PM    MCHC 32.7 12/25/2016 04:37 PM    MCH 27.4 12/25/2016 04:37 PM    RDW 18.6 (H) 12/25/2016 04:37 PM    MPV 8.7 12/25/2016 04:37 PM    Lab Results   Component Value Date/Time    PMNS 70.8 04/16/2016 10:25 AM    LYMPHOCYTES 12.9 (L) 04/16/2016 10:25 AM    EOSINOPHIL 2.9 04/16/2016 10:25 AM    MONOCYTES 13.0 (  H) 04/16/2016 10:25 AM    BASOPHILS 0.4 04/16/2016 10:25 AM    LYMPHSABS 0.90 (L) 04/16/2016 10:25 AM    EOSABS 0.20 04/16/2016 10:25 AM    MONOSABS 0.90 (H) 04/16/2016 10:25 AM    BASOSABS 0.00 04/16/2016 10:25 AM        Basic Metabolic Profile    Lab Results   Component Value Date/Time    SODIUM 137 12/25/2016 04:37 PM    SODIUM 139 02/08/2015 11:20 AM    POTASSIUM 3.8 12/25/2016 04:37 PM    POTASSIUM 3.9 02/08/2015 11:20 AM    CHLORIDE 106 12/25/2016 04:37 PM    CHLORIDE 107 02/08/2015 11:20 AM    CO2 23 12/25/2016 04:37 PM    CO2 25 02/08/2015 11:20 AM    Lab Results   Component Value Date/Time    BUN 21 (H) 12/25/2016 04:37 PM    BUN 28 (H) 02/08/2015 11:20 AM    CREATININE 1.5 (H) 12/25/2016 04:37 PM    GLUCOSENF 103 12/25/2016 04:37 PM    GLUCOSENF 119 (H) 03/21/2014 09:50 AM        Phos 2.0    02/11/17 cbc h/h 11.5  mcv 85.3  rdw 19.7  Plt 283    Bmp normal x cal 10.4  Bun 21  Creat 1.5  Phos 2.7  Fer 89.3  Folic acid 81.0  Vit F75 1117  Iron 113  %32  PTH 173      Diagnosis and Plan  1. Hyperparathyroidism (CMS [place])    2. Fatigue, unspecified type      1. Hyperparathyroidism (CMS [place])   Pt doing better post increase the vit d dose last PTH was 57 in June per pt at this point monitor the pt to see if energy continues to improve if not then may need to consider parathyroid surgery    2. Fatigue, unspecified type  Improved with better control of the hyperpara monitor.      Darleen Crocker, DO

## 2017-07-13 NOTE — Patient Instructions (Signed)
Have a family member look up compression stocking with zipper pressure 15-5 mmhg pressure on web sites like [place].  They will cost you about $30 for a pair and come in mult colors .

## 2017-07-20 DIAGNOSIS — N183 Chronic kidney disease, stage 3 (moderate): Secondary | ICD-10-CM | POA: Diagnosis not present

## 2017-07-20 DIAGNOSIS — E213 Hyperparathyroidism, unspecified: Secondary | ICD-10-CM | POA: Diagnosis not present

## 2017-07-20 DIAGNOSIS — I129 Hypertensive chronic kidney disease with stage 1 through stage 4 chronic kidney disease, or unspecified chronic kidney disease: Secondary | ICD-10-CM | POA: Diagnosis not present

## 2017-07-21 DIAGNOSIS — M9905 Segmental and somatic dysfunction of pelvic region: Secondary | ICD-10-CM | POA: Diagnosis not present

## 2017-07-21 DIAGNOSIS — M9902 Segmental and somatic dysfunction of thoracic region: Secondary | ICD-10-CM | POA: Diagnosis not present

## 2017-07-21 DIAGNOSIS — M5412 Radiculopathy, cervical region: Secondary | ICD-10-CM | POA: Diagnosis not present

## 2017-07-21 DIAGNOSIS — M9903 Segmental and somatic dysfunction of lumbar region: Secondary | ICD-10-CM | POA: Diagnosis not present

## 2017-07-21 DIAGNOSIS — M9901 Segmental and somatic dysfunction of cervical region: Secondary | ICD-10-CM | POA: Diagnosis not present

## 2017-08-05 ENCOUNTER — Other Ambulatory Visit (INDEPENDENT_AMBULATORY_CARE_PROVIDER_SITE_OTHER): Payer: Self-pay

## 2017-08-05 MED ORDER — LEVOTHYROXINE 50 MCG TABLET
75.0000 ug | ORAL_TABLET | Freq: Every morning | ORAL | 3 refills | Status: DC
Start: 2017-08-05 — End: 2018-07-08

## 2017-08-05 NOTE — Telephone Encounter (Signed)
Patient states her dose of synthroid was upped at her 2nd opinion consult last month.  She would like a refill of that medication.

## 2017-09-24 DIAGNOSIS — Z23 Encounter for immunization: Secondary | ICD-10-CM | POA: Diagnosis not present

## 2017-09-24 DIAGNOSIS — R799 Abnormal finding of blood chemistry, unspecified: Secondary | ICD-10-CM | POA: Diagnosis not present

## 2017-09-24 DIAGNOSIS — G4733 Obstructive sleep apnea (adult) (pediatric): Secondary | ICD-10-CM | POA: Diagnosis not present

## 2017-10-13 ENCOUNTER — Encounter (INDEPENDENT_AMBULATORY_CARE_PROVIDER_SITE_OTHER): Payer: Self-pay

## 2017-10-30 DIAGNOSIS — Z1231 Encounter for screening mammogram for malignant neoplasm of breast: Secondary | ICD-10-CM | POA: Diagnosis not present

## 2017-11-04 DIAGNOSIS — T378X5S Adverse effect of other specified systemic anti-infectives and antiparasitics, sequela: Secondary | ICD-10-CM | POA: Diagnosis not present

## 2017-11-04 DIAGNOSIS — H43822 Vitreomacular adhesion, left eye: Secondary | ICD-10-CM | POA: Diagnosis not present

## 2017-11-13 ENCOUNTER — Ambulatory Visit: Payer: Medicare Other | Admitting: Internal Medicine

## 2017-12-19 ENCOUNTER — Other Ambulatory Visit (INDEPENDENT_AMBULATORY_CARE_PROVIDER_SITE_OTHER): Payer: Self-pay | Admitting: Internal Medicine

## 2017-12-25 ENCOUNTER — Other Ambulatory Visit (INDEPENDENT_AMBULATORY_CARE_PROVIDER_SITE_OTHER): Payer: Self-pay | Admitting: Internal Medicine

## 2018-01-04 DIAGNOSIS — Z1231 Encounter for screening mammogram for malignant neoplasm of breast: Secondary | ICD-10-CM | POA: Diagnosis not present

## 2018-01-04 DIAGNOSIS — Z1211 Encounter for screening for malignant neoplasm of colon: Secondary | ICD-10-CM | POA: Diagnosis not present

## 2018-01-04 DIAGNOSIS — Z01411 Encounter for gynecological examination (general) (routine) with abnormal findings: Secondary | ICD-10-CM | POA: Diagnosis not present

## 2018-02-03 DIAGNOSIS — E876 Hypokalemia: Secondary | ICD-10-CM | POA: Diagnosis not present

## 2018-02-03 DIAGNOSIS — N281 Cyst of kidney, acquired: Secondary | ICD-10-CM | POA: Diagnosis not present

## 2018-02-03 DIAGNOSIS — I129 Hypertensive chronic kidney disease with stage 1 through stage 4 chronic kidney disease, or unspecified chronic kidney disease: Secondary | ICD-10-CM | POA: Diagnosis not present

## 2018-02-03 DIAGNOSIS — E213 Hyperparathyroidism, unspecified: Secondary | ICD-10-CM | POA: Diagnosis not present

## 2018-02-03 DIAGNOSIS — N183 Chronic kidney disease, stage 3 (moderate): Secondary | ICD-10-CM | POA: Diagnosis not present

## 2018-02-03 DIAGNOSIS — D649 Anemia, unspecified: Secondary | ICD-10-CM | POA: Diagnosis not present

## 2018-02-03 DIAGNOSIS — M810 Age-related osteoporosis without current pathological fracture: Secondary | ICD-10-CM | POA: Diagnosis not present

## 2018-02-03 DIAGNOSIS — R808 Other proteinuria: Secondary | ICD-10-CM | POA: Diagnosis not present

## 2018-02-03 DIAGNOSIS — R944 Abnormal results of kidney function studies: Secondary | ICD-10-CM | POA: Diagnosis not present

## 2018-02-16 DIAGNOSIS — E213 Hyperparathyroidism, unspecified: Secondary | ICD-10-CM | POA: Diagnosis not present

## 2018-02-16 DIAGNOSIS — I129 Hypertensive chronic kidney disease with stage 1 through stage 4 chronic kidney disease, or unspecified chronic kidney disease: Secondary | ICD-10-CM | POA: Diagnosis not present

## 2018-02-16 DIAGNOSIS — N184 Chronic kidney disease, stage 4 (severe): Secondary | ICD-10-CM | POA: Diagnosis not present

## 2018-02-25 ENCOUNTER — Ambulatory Visit (INDEPENDENT_AMBULATORY_CARE_PROVIDER_SITE_OTHER): Payer: Medicare Other | Admitting: Internal Medicine

## 2018-02-25 ENCOUNTER — Encounter: Payer: Self-pay | Admitting: Internal Medicine

## 2018-02-25 ENCOUNTER — Other Ambulatory Visit: Payer: Self-pay

## 2018-02-25 ENCOUNTER — Other Ambulatory Visit: Payer: Self-pay | Admitting: Internal Medicine

## 2018-02-25 VITALS — BP 124/84 | HR 57 | Ht 64.0 in | Wt 170.4 lb

## 2018-02-25 DIAGNOSIS — M810 Age-related osteoporosis without current pathological fracture: Secondary | ICD-10-CM | POA: Diagnosis not present

## 2018-02-25 DIAGNOSIS — E559 Vitamin D deficiency, unspecified: Secondary | ICD-10-CM

## 2018-02-25 DIAGNOSIS — E213 Hyperparathyroidism, unspecified: Secondary | ICD-10-CM

## 2018-02-25 DIAGNOSIS — E039 Hypothyroidism, unspecified: Secondary | ICD-10-CM | POA: Diagnosis not present

## 2018-02-25 NOTE — Patient Instructions (Signed)
Please stop at the lab.  Continue vitamin D 4000 units daily.  I will refer you to see Dr. Armandina Gemma at [facility].  Please come back for a follow-up appointment in 6 months.

## 2018-02-25 NOTE — Progress Notes (Signed)
Patient ID: Teresa Caldwell, female   DOB: 1941/08/27, 77 y.o.   MRN: [id number]    HPI  Teresa Caldwell is a 77 y.o.-year-old female, initially referred by her cardiologist, Dr. Haroldine Laws (previously saw Dr. 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 She was seeing endocrinology in [place], [place], Dr. Bobbe Medico, now retired.  She is here with her daughter who offers part of the hx especially Re: sxs and PMH.  Reviewed and addended history: In 08/2016, she started to feel very fatigued, weak, also with decreased appetite, weight loss (20 lbs in 3 mo before our first appt).   This has improved, but she is still fatigued and deconditioned - has to cook sitting down as back hurts.  Hypercalcemia  - dx in 2000s for - was followed by her endocrinologist in [place]. They were following the levels of the calcium and PTH. She mentions that her calcium started to increase in Fall 2017.   She refused parathyroid surgery in the past!  I reviewed pt's pertinent labs: 07/13/2017: PTH 210, calcium: 10.4 (8.5-10.1) Lab Results  Component Value Date   PTH 76 (H) 04/13/2017   CALCIUM 10.4 04/13/2017   CALCIUM 11.1 (H) 01/02/2017  12/22/2016: Calcium 10.8 (8.5-10.1), iCa 5.91 (4.57-5.43) PTH 256 12/17/2016: Calcium 10.4 (8.5-10.1), Mg  06/25/2015: iCa 1.43 (1.12-1.32), PTH 159  24-hour urine calcium level was normal: Component     Latest Ref Rng & Units 05/14/2017  Calcium, Ur     Not estab mg/dL 8  Calcium, 24 hour urine     35 - 250 mg/24 h 192  Creatinine, Urine     20 - 320 mg/dL 24  Creatinine, 24H Ur     0.63 - 2.50 g/24 h 0.58 (L)   She has osteoporosis.  No DXA results available for review  No fractures but had several falls in the past. Last was 05/2016.  She has a history of 3 kidney stones>> llast in 2015, for which she had lithotripsy.  + CKD (sees nephrology). Last BUN/Cr: 02/03/2018:  BUN/Cr 25/1.7, GFR 35 Lab Results  Component  Value Date   BUN 26 (H) 04/13/2017   BUN 20 01/02/2017   CREATININE 1.47 (H) 04/13/2017   CREATININE 1.37 (H) 01/02/2017  12/22/2016: 20/1.4, EGFR 37 12/17/2016: 18/1.5, EGFR 34  She is not on HCTZ, but was on this in the past, up to at least 10/2016.  Vitamin D deficiency:  Vitamin D level normalized in 03/2017: 07/13/2017: 43.3 Lab Results  Component Value Date   VD25OH 46.04 04/13/2017   VD25OH 25.93 (L) 02/06/2017  12/17/2016: Vitamin D 22.8 06/25/2015: Vitamin D 19.2 Her cardiologist added a vitamin D 2000 IU/day >> we increased to 4000 IU in 01/2017 >> subsequent vitamin D level was normal  Pt does not have a FH of hypercalcemia, pituitary tumors. Mother and sister had osteoporosis.  She has a sister and her daughter that have thyroid cancer  Hypothyroidism  - on LT4, dose reduced to 50 mcg in 12/2016 when Amiodarone dose was reduced to 100 mg daily. She also stopped Bystolic then.   Pt is currently on levothyroxine 75 mcg daily, taken: - in am - fasting - at least 30 min from b'fast - no Ca, Fe, MVI, PPIs - not on Biotin  She feels much better after her vitamin D and TSH levels normalized, but still fatigued.  Latest TFT levels were normal: 07/13/2018: 1.07 Lab Results  Component Value Date   TSH 1.35 04/13/2017   TSH  3.59 02/06/2017   FREET4 1.45 04/13/2017   FREET4 1.40 02/06/2017  12/17/2016: TSH 1.69  07/13/2017: TPO Abs <28  She and her husband are driving from 5 hours away to come see their daughter.  ROS: Constitutional: no weight gain/no weight loss, + fatigue, no subjective hyperthermia, no subjective hypothermia Eyes: no blurry vision, no xerophthalmia ENT: no sore throat, no nodules palpated in throat, no dysphagia, no odynophagia, no hoarseness; + sinus drainage Cardiovascular: no CP/no SOB/no palpitations/no leg swelling Respiratory: no cough/no SOB/no wheezing Gastrointestinal: no N/no V/no D/no C/no acid reflux Musculoskeletal: + muscle  aches/no joint aches Skin: no rashes, no hair loss, + dry skin  Neurological: no tremors/no numbness/no tingling/no dizziness  I reviewed pt's medications, allergies, PMH, social hx, family hx, and changes were documented in the history of present illness. Otherwise, unchanged from my initial visit note.  Patient Active Problem List   Diagnosis Date Noted  . Vitamin D insufficiency 02/06/2017    Priority: High  . Hypothyroidism 02/06/2017    Priority: High  . Hyperparathyroidism ([place]) 02/06/2017    Priority: High  . Osteoporosis 02/06/2017    Priority: High  . Glucose intolerance (impaired glucose tolerance) 02/06/2017  . Essential hypertension 02/06/2017  . Atrial fibrillation ([place]) 02/06/2017  . OSA on CPAP 02/06/2017  . B12 deficiency 02/06/2017  . Hyperlipidemia 02/06/2017  . IDA (iron deficiency anemia) 02/06/2017   Past Surgical History:  Procedure Laterality Date  . TONSILLECTOMY  1956   . WRIST ARTHROSCOPY Left 1994   Also: - Hysterectomy 1989 - Lithotripsy x3 - Cataract L and R 2018   Social History   Social History  . Marital status: Married    Spouse name: N/A  . Number of children: 5 (1 daughter lives in [place])   Occupational History  . n/a   Social History Main Topics  . Smoking status: Never Smoker  . Smokeless tobacco: Never Used  . Alcohol use No  . Drug use: No   Current Outpatient Medications on File Prior to Visit  Medication Sig Dispense Refill  . amiodarone (PACERONE) 100 MG tablet Take 100 mg by mouth every morning.    Marland Kitchen amLODipine (NORVASC) 5 MG tablet Take 5 mg by mouth every evening.    Marland Kitchen apixaban (ELIQUIS) 5 MG TABS tablet Take 5 mg by mouth 2 (two) times daily.    . Cholecalciferol (VITAMIN D-3) 1000 units CAPS Take 2,000 Units by mouth every morning.    . cloNIDine (CATAPRES) 0.1 MG tablet Take 0.1 mg by mouth See admin instructions. TWO TIMES A DAY ONLY IF SYSTOLIC READING IS 882 OR GREATER    . iron polysaccharides (FERREX  150) 150 MG capsule Take 150 mg by mouth every evening.    Marland Kitchen ketorolac (ACULAR) 0.4 % SOLN Place 1 drop into the left eye 4 (four) times daily. For 2 weeks (filled: 12/30/16)    . levothyroxine (SYNTHROID, LEVOTHROID) 75 MCG tablet Take 1 tablet (75 mcg total) by mouth daily. 60 tablet 2  . lisinopril (PRINIVIL,ZESTRIL) 40 MG tablet Take 40 mg by mouth every morning.     . metoCLOPramide (REGLAN) 10 MG tablet Take 0.5 tablets (5 mg total) by mouth every 6 (six) hours as needed for nausea. 4 tablet 0  . potassium chloride (K-DUR,KLOR-CON) 10 MEQ tablet Take 10 mEq by mouth every evening.    . prednisoLONE acetate (PRED FORTE) 1 % ophthalmic suspension Place 1 drop into the left eye See admin instructions. Four times a day for  14 days then two times a day for 14 days    . traZODone (DESYREL) 50 MG tablet Take 25-50 mg by mouth at bedtime as needed for sleep.    Marland Kitchen trimethoprim-polymyxin b (POLYTRIM) ophthalmic solution Place 1 drop into the left eye See admin instructions. Four times a day for 7 days then 1 drop four times a day starting 3 days before day of surgery    . vitamin B-12 (CYANOCOBALAMIN) 1000 MCG tablet Take 1,000 mcg by mouth daily.     No current facility-administered medications on file prior to visit.    Allergies  Allergen Reactions  . Ciprofloxacin Hives  . Digoxin And Related Hives    And blisters (eyes & lips)  . Sulfa Antibiotics Hives  . Tape Other (See Comments)    Needs tape for SENSITIVE SKIN  . Clindamycin/Lincomycin Rash   Family History  Problem Relation Age of Onset  . Hypertension Mother   . Heart disease Mother   . Diabetes Child   . Hypertension Child   . Thyroid disease Child   . Cancer Child     PE: BP 124/84   Pulse (!) 57   Ht 5' 4"  (1.626 m)   Wt 170 lb 6.4 oz (77.3 kg)   SpO2 97%   BMI 29.25 kg/m  Wt Readings from Last 3 Encounters:  02/25/18 170 lb 6.4 oz (77.3 kg)  05/14/17 157 lb (71.2 kg)  02/06/17 158 lb (71.7 kg)    Constitutional: Normal weight, in NAD, + kyphosis Eyes: PERRLA, EOMI, no exophthalmos ENT: moist mucous membranes, no thyromegaly, no cervical lymphadenopathy Cardiovascular: RRR, No MRG Respiratory: CTA B Gastrointestinal: abdomen soft, NT, ND, BS+ Musculoskeletal: no deformities, strength intact in all 4 Skin: moist, warm, no rashes Neurological: no tremor with outstretched hands, DTR normal in all 4  Assessment: 1. Hypercalcemia/hyperparathyroidism - She does have complications from hypercalcemia: + h/o nephrolithiasis, + osteoporosis, no fractures. No abdominal pain, depression, bone pain.  2. Vit D insufficiency  3. Hypothyroidism  Plan: 1. And 2.  Before her first visit, patient had several instances of elevated calcium, with the highest level being at 11.1%.  An intact PTH was also high, at 256 (for a calcium level of 10.8).  Since then, both calcium and PTH decreased to 10.4 and 76, respectively, as her vitamin D level improved to normal   (03/2017) a 24-hour urine calcium level was normal. 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 However, at this visit, I discussed again with her and her daughter about a possible parathyroidectomy and also what she can expect afterwards. She is curious whether her fatigue may improve after the Sx and I advised her that it may, but I cannot guarantee this. She understands and accepts to go and discuss with the Surgeon. Of note, she is on Eliquis and she will have to be bridged with heparin for surgery. - She does meet criteria for surgery (she has the ones in bold): Increased calcium by more than 1 mg/dL above the upper limit of normal  Kidney ds.  Osteoporosis (or Vb fx) Age <16 years old New (2013): High UCa >400 mg/d and increased stone risk by biochemical stone risk analysis Presence of nephrolithiasis or nephrocalcinosis Pt's  preference!  -She now continues vitamin D 4000 units daily, increased from 2000 units daily in 01/2017 - I advised the patient for now to continue the 4000  units of vitamin D and we will repeat her level at next visit.  - We will repeat calcium, PTH, vitamin D today - I will see the patient back in 1 year  3. Hypothyroidism - Reportedly diagnosed before starting amiodarone - latest thyroid labs reviewed with pt >> normal in 07/2017 - she continues on LT4 75 mcg daily - pt feels good on this dose. - we discussed about taking the thyroid hormone every day, with water, >30 minutes before breakfast, separated by >4 hours from acid reflux medications, calcium, iron, multivitamins. Pt. is taking it correctly. - will check thyroid tests today: TSH and fT4 - If labs are abnormal, she will need to return for repeat TFTs in 1.5 months  Orders Placed This Encounter  Procedures  . Parathyroid hormone, intact (no Ca)  . BASIC METABOLIC PANEL WITH GFR  . VITAMIN D 25 Hydroxy (Vit-D Deficiency, Fractures)  . TSH  . T4, free   Component     Latest Ref Rng & Units 02/25/2018  Glucose     65 - 99 mg/dL 99  BUN     7 - 25 mg/dL 29 (H)  Creatinine     0.60 - 0.93 mg/dL 1.64 (H)  GFR, Est Non African American     > OR = 60 mL/min/1.32m 30 (L)  GFR, Est African American     > OR = 60 mL/min/1.746m35 (L)  BUN/Creatinine Ratio     6 - 22 (calc) 18  Sodium     135 - 146 mmol/L 138  Potassium     3.5 - 5.3 mmol/L 4.6  Chloride     98 - 110 mmol/L 105  CO2     20 - 32 mmol/L 27  Calcium     8.6 - 10.4 mg/dL 9.1  VITD     30.00 - 100.00 ng/mL 59.73  TSH     0.35 - 4.50 uIU/mL 2.05  T4,Free(Direct)     0.60 - 1.60 ng/dL 1.58  Creatinine, 24H Ur     0.50 - 2.15 g/24 h 0.67  PTH, Intact     15 - 65 pg/mL 45   Calcium and PTH level normal, as is her vitamin D.  Her kidney function is still low.  I would suggest to hold off referral to surgery for now based on the above values. TFTs are  normal. Of note, they brought another urine collection, but apparently the calcium was undetectable (I suspect the problem with the assay)... We will check with the lab.  I do not feel we need to repeat this.  CrPhilemon KingdomMD PhD [facility]

## 2018-02-26 DIAGNOSIS — Z9841 Cataract extraction status, right eye: Secondary | ICD-10-CM | POA: Diagnosis not present

## 2018-02-26 DIAGNOSIS — T378X5S Adverse effect of other specified systemic anti-infectives and antiparasitics, sequela: Secondary | ICD-10-CM | POA: Diagnosis not present

## 2018-02-26 DIAGNOSIS — Z9842 Cataract extraction status, left eye: Secondary | ICD-10-CM | POA: Diagnosis not present

## 2018-02-26 LAB — BASIC METABOLIC PANEL WITH GFR
BUN/Creatinine Ratio: 18 (calc) (ref 6–22)
BUN: 29 mg/dL — AB (ref 7–25)
CO2: 27 mmol/L (ref 20–32)
CREATININE: 1.64 mg/dL — AB (ref 0.60–0.93)
Calcium: 9.1 mg/dL (ref 8.6–10.4)
Chloride: 105 mmol/L (ref 98–110)
GFR, Est African American: 35 mL/min/{1.73_m2} — ABNORMAL LOW (ref >=60)
GFR, Est Non African American: 30 mL/min/{1.73_m2} — ABNORMAL LOW (ref >=60)
Glucose, Bld: 99 mg/dL (ref 65–99)
Potassium: 4.6 mmol/L (ref 3.5–5.3)
Sodium: 138 mmol/L (ref 135–146)

## 2018-02-26 LAB — TSH: TSH: 2.05 u[IU]/mL (ref 0.35–4.50)

## 2018-02-26 LAB — T4, FREE: Free T4: 1.58 ng/dL (ref 0.60–1.60)

## 2018-02-26 LAB — PARATHYROID HORMONE, INTACT (NO CA): PTH: 45 pg/mL (ref 15–65)

## 2018-02-26 LAB — CALCIUM, URINE, 24 HOUR

## 2018-02-26 LAB — VITAMIN D 25 HYDROXY (VIT D DEFICIENCY, FRACTURES): VITD: 59.73 ng/mL (ref 30.00–100.00)

## 2018-02-26 LAB — CREATININE, URINE, 24 HOUR: Creatinine, 24H Ur: 0.67 g/(24.h) (ref 0.50–2.15)

## 2018-02-26 LAB — EXTRA URINE SPECIMEN

## 2018-03-01 ENCOUNTER — Other Ambulatory Visit: Payer: Self-pay

## 2018-03-01 ENCOUNTER — Telehealth: Payer: Self-pay | Admitting: Internal Medicine

## 2018-03-01 NOTE — Telephone Encounter (Signed)
Patient is retuning your call [phone]

## 2018-03-03 NOTE — Telephone Encounter (Signed)
Spoke to spouse. Gave results and instructions. Spouse verbalized understanding.

## 2018-03-31 DIAGNOSIS — I480 Paroxysmal atrial fibrillation: Secondary | ICD-10-CM | POA: Diagnosis not present

## 2018-03-31 DIAGNOSIS — E782 Mixed hyperlipidemia: Secondary | ICD-10-CM | POA: Diagnosis not present

## 2018-03-31 DIAGNOSIS — E039 Hypothyroidism, unspecified: Secondary | ICD-10-CM | POA: Diagnosis not present

## 2018-03-31 DIAGNOSIS — G4730 Sleep apnea, unspecified: Secondary | ICD-10-CM | POA: Diagnosis not present

## 2018-03-31 DIAGNOSIS — I10 Essential (primary) hypertension: Secondary | ICD-10-CM | POA: Diagnosis not present

## 2018-03-31 DIAGNOSIS — N183 Chronic kidney disease, stage 3 (moderate): Secondary | ICD-10-CM | POA: Diagnosis not present

## 2018-04-02 ENCOUNTER — Other Ambulatory Visit (INDEPENDENT_AMBULATORY_CARE_PROVIDER_SITE_OTHER): Payer: Self-pay | Admitting: Internal Medicine

## 2018-04-02 DIAGNOSIS — D509 Iron deficiency anemia, unspecified: Secondary | ICD-10-CM

## 2018-06-04 ENCOUNTER — Other Ambulatory Visit (INDEPENDENT_AMBULATORY_CARE_PROVIDER_SITE_OTHER): Payer: Self-pay | Admitting: Internal Medicine

## 2018-06-04 MED ORDER — TRAZODONE 50 MG TABLET
50.0000 mg | ORAL_TABLET | Freq: Every evening | ORAL | 3 refills | Status: DC
Start: 2018-06-04 — End: 2019-05-06

## 2018-07-02 ENCOUNTER — Encounter (INDEPENDENT_AMBULATORY_CARE_PROVIDER_SITE_OTHER): Payer: Self-pay | Admitting: Internal Medicine

## 2018-07-08 ENCOUNTER — Encounter (INDEPENDENT_AMBULATORY_CARE_PROVIDER_SITE_OTHER): Payer: Self-pay | Admitting: Internal Medicine

## 2018-07-08 ENCOUNTER — Ambulatory Visit (INDEPENDENT_AMBULATORY_CARE_PROVIDER_SITE_OTHER): Payer: Medicare Other | Admitting: Internal Medicine

## 2018-07-08 VITALS — BP 146/82 | HR 71 | Ht 64.0 in | Wt 173.0 lb

## 2018-07-08 DIAGNOSIS — E039 Hypothyroidism, unspecified: Secondary | ICD-10-CM

## 2018-07-08 DIAGNOSIS — L989 Disorder of the skin and subcutaneous tissue, unspecified: Secondary | ICD-10-CM | POA: Insufficient documentation

## 2018-07-08 DIAGNOSIS — G8929 Other chronic pain: Secondary | ICD-10-CM | POA: Insufficient documentation

## 2018-07-08 DIAGNOSIS — D509 Iron deficiency anemia, unspecified: Secondary | ICD-10-CM

## 2018-07-08 DIAGNOSIS — E538 Deficiency of other specified B group vitamins: Secondary | ICD-10-CM

## 2018-07-08 DIAGNOSIS — Z6829 Body mass index (BMI) 29.0-29.9, adult: Secondary | ICD-10-CM

## 2018-07-08 DIAGNOSIS — R5383 Other fatigue: Secondary | ICD-10-CM

## 2018-07-08 DIAGNOSIS — M546 Pain in thoracic spine: Secondary | ICD-10-CM | POA: Insufficient documentation

## 2018-07-08 DIAGNOSIS — E213 Hyperparathyroidism, unspecified: Secondary | ICD-10-CM

## 2018-07-08 DIAGNOSIS — I10 Essential (primary) hypertension: Secondary | ICD-10-CM

## 2018-07-08 DIAGNOSIS — G4733 Obstructive sleep apnea (adult) (pediatric): Secondary | ICD-10-CM

## 2018-07-08 DIAGNOSIS — Z9989 Dependence on other enabling machines and devices: Secondary | ICD-10-CM

## 2018-07-08 DIAGNOSIS — N184 Chronic kidney disease, stage 4 (severe): Secondary | ICD-10-CM | POA: Diagnosis not present

## 2018-07-08 DIAGNOSIS — D649 Anemia, unspecified: Secondary | ICD-10-CM | POA: Diagnosis not present

## 2018-07-08 DIAGNOSIS — E876 Hypokalemia: Secondary | ICD-10-CM | POA: Diagnosis not present

## 2018-07-08 DIAGNOSIS — M810 Age-related osteoporosis without current pathological fracture: Secondary | ICD-10-CM | POA: Diagnosis not present

## 2018-07-08 DIAGNOSIS — I129 Hypertensive chronic kidney disease with stage 1 through stage 4 chronic kidney disease, or unspecified chronic kidney disease: Secondary | ICD-10-CM | POA: Diagnosis not present

## 2018-07-08 DIAGNOSIS — R808 Other proteinuria: Secondary | ICD-10-CM | POA: Diagnosis not present

## 2018-07-08 DIAGNOSIS — N281 Cyst of kidney, acquired: Secondary | ICD-10-CM | POA: Diagnosis not present

## 2018-07-08 LAB — COMPREHENSIVE METABOLIC PANEL, NON-FASTING
ALBUMIN: 4.1 g/dL (ref 3.4–5.0)
ALKALINE PHOSPHATASE: 102 U/L (ref 45–117)
ALKALINE PHOSPHATASE: 102 U/L (ref 45–117)
ALT (SGPT): 16 U/L (ref 13–56)
ALT (SGPT): 16 U/L (ref 13–56)
AST (SGOT): 13 U/L — ABNORMAL LOW (ref 15–37)
AST (SGOT): 13 U/L — ABNORMAL LOW (ref 15–37)
BILIRUBIN, TOTAL: 0.4 mg/dL (ref 0.2–1.0)
BUN: 26 mg/dL — ABNORMAL HIGH (ref 7–18)
CALCIUM: 10.8 mg/dL — ABNORMAL HIGH (ref 8.5–10.1)
CHLORIDE: 104 mmol/L (ref 98–107)
CHLORIDE: 104 mmol/L (ref 98–107)
CO2 TOTAL: 25 mmol/L (ref 21–32)
CREATININE: 1.6 mg/dL — ABNORMAL HIGH (ref 0.6–1.0)
EGFR AA: 38
EGFR NON-AFRICAN AMERICAN: 31
GLUCOSE: 90 mg/dL (ref 74–106)
POTASSIUM: 4.4 mmol/L (ref 3.5–5.1)
POTASSIUM: 4.4 mmol/L (ref 3.5–5.1)
SODIUM: 139 mmol/L (ref 136–145)
TOTAL PROTEIN: 7.5 g/dL (ref 6.4–8.2)

## 2018-07-08 LAB — THYROXINE, FREE (FREE T4): THYROXINE, FREE (FREE T4): 2.1 ng/dL — ABNORMAL HIGH (ref 0.9–1.8)

## 2018-07-08 LAB — THYROID STIMULATING HORMONE (SENSITIVE TSH): TSH: 0.723 u[IU]/mL (ref 0.350–5.550)

## 2018-07-08 LAB — FERRITIN: FERRITIN: 16.6 ng/mL (ref 8.0–252.0)

## 2018-07-08 MED ORDER — LEVOTHYROXINE 50 MCG TABLET
75.0000 ug | ORAL_TABLET | Freq: Every morning | ORAL | 3 refills | Status: DC
Start: 2018-07-08 — End: 2019-07-27

## 2018-07-08 NOTE — Patient Instructions (Addendum)
[email or web address]    Check your blood pressure at home 3 x a week

## 2018-07-08 NOTE — Progress Notes (Signed)
INTERNAL MED, MEDICAL STAFF OFFICE [facility]LD[facility]  [place] [id number]  [place]  Date of Service: 07/08/2018    Chief Complaint:   Chief Complaint   Patient presents with   . Annual Exam     1 year fu -having back pain        History  HPI  To go down to [facility] to have the parathyroid re eval.    Having upper back pain if do work with arms extending in front of her ie if cook dinner have to stop 2 x to rest the arms.     Has spot on her back that has been bothering her on the low back area.     Energy the same    Using CPAP x on sat night and sometime sun night due to mask very tight on the face and causes bruizing of the face at times. Not wear on sat night due to go to [facility]ruch on sun.    Not get dexa due to not time for it yet post last visit. But now over 1 yr so ins will cover.     03/31/18 [place] cards did lipid no change but BP was 138/90 no med change asked to f/u with PCP and nephro to adjust  02/16/18 nephro zanabli  No change PTH ot endo     11/18 pulom  For osa no change on CPAP pfts next visit    Current Outpatient Medications   Medication Sig   . amiodarone (PACERONE) 100 mg Oral Tablet Take 100 mg by mouth Once a day   . amLODIPine (NORVASC) 5 mg Oral Tablet Take 1 Tab (5 mg total) by mouth Once a day   . apixaban (ELIQUIS) 5 mg Oral Tablet Take 5 mg by mouth Twice daily   . cholecalciferol, vitamin D3, 1,000 unit Oral Tablet Take 2,000 Units by mouth Twice daily    . cloNIDine HCl (CATAPRES) 0.1 mg Oral Tablet Take 0.1 mg by mouth Twice per day as needed   . cyanocobalamin (VITAMIN B 12) 1,000 mcg Oral Tablet Take 1,000 mcg by mouth Once a day   . levothyroxine (SYNTHROID) 50 mcg Oral Tablet Take 1.5 Tabs (75 mcg total) by mouth Every morning for 337 days   . lisinopril (PRINIVIL) 40 mg Oral Tablet TAKE ONE TABLET BY MOUTH ONCE DAILY   . polysaccharide iron complex (FERREX 150) 150 mg iron Oral Capsule TAKE 1 CAPSULE BY MOUTH ONCE DAILY   .  potassium chloride (K-DUR) 10 mEq Oral Tab Sust.Rel. Particle/Crystal Take 10 mEq by mouth Once a day   . traZODone (DESYREL) 50 mg Oral Tablet Take 1 Tab (50 mg total) by mouth Every night       Review of Systems    Examination  Vitals: BP (!) 146/82   Pulse 71   Ht 1.626 m (5\' 4" )   Wt 78.5 kg (173 lb)   SpO2 98%   Breastfeeding? No   BMI 29.70 kg/m       Physical Exam   Constitutional: She appears well-developed and well-nourished.   HENT:   Head: Normocephalic and atraumatic.   Neck: Neck supple.   Cardiovascular: Normal rate, regular rhythm and normal heart sounds.   No murmur heard.  Pulmonary/Chest: Effort normal and breath sounds normal. No respiratory distress. She has no wheezes.   Musculoskeletal: She exhibits tenderness. She exhibits no edema.   Positive kyphosis  of the throsic spine    Shoulder and elbow and wrist with full ROM with passive ROM.    Skin: No rash noted. No erythema.   2 cm x 1 cm area on the low back at about t12 level over the rt paraspinal muscle area with ruff area no discharge or increase temp   Vitals reviewed.    Ortho Exam    Results  02/03/18 cbc wbc 6/7 h/h 11.8 plt 241  Bmp bun 25 creat 1.7 increase from 1/5  ca 10.1 down from 10.4  03/31/18 c/t/h/l 175/81/66/92  11/18 mammo neg  Diagnosis and Plan  1. Hyperparathyroidism (CMS [place])    2. Hypothyroidism, unspecified type    3. OSA on CPAP    4. Essential hypertension    5. Fatigue, unspecified type    6. Iron deficiency anemia, unspecified iron deficiency anemia type    7. B12 deficiency    1. Hyperparathyroidism (CMS [facility])  Check dexa to see if have osteoprosis increasing the kyphosis txz  If positive source increasing this would be the hyper parathy  - DEXA BONE DENSITOMETRY; Future  - DEXA BONE DENSITOMETRY    2. Hypothyroidism, unspecified type  Recheck labs   - levothyroxine (SYNTHROID) 50 mcg Oral Tablet; Take 1.5 Tabs (75 mcg total) by mouth Every morning for 337 days  Dispense: 135 Tab; Refill: 3  - THYROID STIMULATING  HORMONE (SENSITIVE TSH); Future  - Free T4; Future  - THYROID STIMULATING HORMONE (SENSITIVE TSH)  - Free T4    3. OSA on CPAP  With discomfort with mask asked them to look at no [email or web address]  and [email or web address] for other  Mask options since on pressure of 10/5 and not wearing the mask reg   But no Afib since on the CPAP so is helping when she wears it    - COMPREHENSIVE METABOLIC PANEL, NON-FASTING; Future  - COMPREHENSIVE METABOLIC PANEL, NON-FASTING    4. Essential hypertension  Cards wants nephro or me to adjust willhave pt check BP at home and then give me results then will adjust   - COMPREHENSIVE METABOLIC PANEL, NON-FASTING; Future  - COMPREHENSIVE METABOLIC PANEL, NON-FASTING    5. Fatigue, unspecified type  Unchanged suspect from parathyroid monitor   - COMPREHENSIVE METABOLIC PANEL, NON-FASTING; Future  - COMPREHENSIVE METABOLIC PANEL, NON-FASTING    6. Iron deficiency anemia, unspecified iron deficiency anemia type  Recheck labs   - FERRITIN; Future  - FERRITIN    7. B12 deficiency  Recheck labs   - METHYLMALONIC ACID (MMA), QUANTITATIVE, SERUM; Future  - METHYLMALONIC ACID (MMA), QUANTITATIVE, SERUM    8. Chronic bilateral thoracic back pain  Due to kyphosis and change in mechanical advantage of the muscles instructed her on shoulder rolls exercise to loosen the muscles so will not spasm as much when work.    9. Skin lesion  C/w ak   Post verbal consent tx with histofreeze to eleinate the lesion and pt tol the proc well.   Orders Placed This Encounter   . DEXA BONE DENSITOMETRY   . THYROID STIMULATING HORMONE (SENSITIVE TSH)   . Free T4   . COMPREHENSIVE METABOLIC PANEL, NON-FASTING   . FERRITIN   . METHYLMALONIC ACID (MMA), QUANTITATIVE, SERUM   . levothyroxine (SYNTHROID) 50 mcg Oral Tablet       Darleen Crocker, DO

## 2018-07-09 ENCOUNTER — Telehealth (INDEPENDENT_AMBULATORY_CARE_PROVIDER_SITE_OTHER): Payer: Self-pay | Admitting: Internal Medicine

## 2018-07-09 DIAGNOSIS — E039 Hypothyroidism, unspecified: Secondary | ICD-10-CM

## 2018-07-09 NOTE — Telephone Encounter (Signed)
07/08/18 labs TSH 0.723 but free t4 2.1 on amnio so free t4 more reliable    Left message at her home to to not take the synthroid on Sunday so current dose 525/ week new dose 450/week by holding Sunday dose    Will recheck in 6 weeks.     Also fer low at 16.6 but was ordered by nephro so will not change tx for this to minimize confusion     PTH elevated at 157 on vit d 4000/ day due to primary hyperpara but to see endo in [place] later this month.   Left message regarding the above

## 2018-07-13 DIAGNOSIS — M8588 Other specified disorders of bone density and structure, other site: Secondary | ICD-10-CM | POA: Diagnosis not present

## 2018-07-13 DIAGNOSIS — Z13820 Encounter for screening for osteoporosis: Secondary | ICD-10-CM | POA: Diagnosis not present

## 2018-07-13 DIAGNOSIS — M810 Age-related osteoporosis without current pathological fracture: Secondary | ICD-10-CM | POA: Diagnosis not present

## 2018-07-13 DIAGNOSIS — E213 Hyperparathyroidism, unspecified: Secondary | ICD-10-CM | POA: Diagnosis not present

## 2018-07-13 LAB — METHYLMALONIC ACID (MMA), QUANTITATIVE, SERUM: METHYLMALONIC ACID (MMA), QUANTITATIVE, PLASMA: 0.27 nmol/mL (ref <=0.40)

## 2018-07-15 ENCOUNTER — Telehealth (INDEPENDENT_AMBULATORY_CARE_PROVIDER_SITE_OTHER): Payer: Self-pay | Admitting: Internal Medicine

## 2018-07-15 NOTE — Telephone Encounter (Signed)
07/13/18 dexa with lumbar t -2.2 c/w osteopenia with decrease density of 5/7 % since 2/17   Hip t -2.9 c/w osteoporosis unchanged from prior.  Not on fosamax type agent     No answer at home left message

## 2018-07-16 NOTE — Telephone Encounter (Signed)
Pt not home not able to leave message phone just kept ringing will send letter with results.

## 2018-07-23 ENCOUNTER — Telehealth (INDEPENDENT_AMBULATORY_CARE_PROVIDER_SITE_OTHER): Payer: Self-pay | Admitting: Internal Medicine

## 2018-07-23 MED ORDER — ALENDRONATE 35 MG TABLET
35.0000 mg | ORAL_TABLET | ORAL | 3 refills | Status: DC
Start: 2018-07-23 — End: 2018-10-05

## 2018-07-23 NOTE — Telephone Encounter (Signed)
Patient states she received a letter stating you were trying to reach her, she called and asked that you try to call her back.

## 2018-07-23 NOTE — Telephone Encounter (Signed)
Pt with gfr > 35 and dexa 8/19 with osteoprosis of the hip d/w her of adding in foasmax pt agreeable to this due to gfr will use low dose of fosamax 35 mg qweek rather then 70 qweek.

## 2018-08-20 DIAGNOSIS — E213 Hyperparathyroidism, unspecified: Secondary | ICD-10-CM | POA: Diagnosis not present

## 2018-08-20 DIAGNOSIS — N184 Chronic kidney disease, stage 4 (severe): Secondary | ICD-10-CM | POA: Diagnosis not present

## 2018-08-20 DIAGNOSIS — I129 Hypertensive chronic kidney disease with stage 1 through stage 4 chronic kidney disease, or unspecified chronic kidney disease: Secondary | ICD-10-CM | POA: Diagnosis not present

## 2018-08-27 ENCOUNTER — Ambulatory Visit (INDEPENDENT_AMBULATORY_CARE_PROVIDER_SITE_OTHER): Payer: Medicare Other | Admitting: Internal Medicine

## 2018-08-27 ENCOUNTER — Encounter: Payer: Self-pay | Admitting: Internal Medicine

## 2018-08-27 VITALS — BP 118/76 | HR 86 | Ht 64.0 in | Wt 176.0 lb

## 2018-08-27 DIAGNOSIS — Z23 Encounter for immunization: Secondary | ICD-10-CM | POA: Diagnosis not present

## 2018-08-27 DIAGNOSIS — E039 Hypothyroidism, unspecified: Secondary | ICD-10-CM | POA: Diagnosis not present

## 2018-08-27 DIAGNOSIS — E559 Vitamin D deficiency, unspecified: Secondary | ICD-10-CM

## 2018-08-27 DIAGNOSIS — M810 Age-related osteoporosis without current pathological fracture: Secondary | ICD-10-CM

## 2018-08-27 DIAGNOSIS — E213 Hyperparathyroidism, unspecified: Secondary | ICD-10-CM

## 2018-08-27 LAB — BASIC METABOLIC PANEL WITH GFR
BUN / CREAT RATIO: 22 (calc) (ref 6–22)
BUN: 37 mg/dL — ABNORMAL HIGH (ref 7–25)
CALCIUM: 10.4 mg/dL (ref 8.6–10.4)
CO2: 24 mmol/L (ref 20–32)
Chloride: 105 mmol/L (ref 98–110)
Creat: 1.71 mg/dL — ABNORMAL HIGH (ref 0.60–0.93)
GFR, EST AFRICAN AMERICAN: 33 mL/min/{1.73_m2} — AB (ref >=60)
GFR, EST NON AFRICAN AMERICAN: 28 mL/min/{1.73_m2} — AB (ref >=60)
Glucose, Bld: 94 mg/dL (ref 65–99)
POTASSIUM: 4.7 mmol/L (ref 3.5–5.3)
Sodium: 138 mmol/L (ref 135–146)

## 2018-08-27 LAB — VITAMIN D 25 HYDROXY (VIT D DEFICIENCY, FRACTURES): VITD: 69.16 ng/mL (ref 30.00–100.00)

## 2018-08-27 NOTE — Progress Notes (Signed)
Patient ID: Teresa Caldwell, female   DOB: [birth year], 77 y.o.   MRN: [id number]    HPI  Teresa Caldwell is a 77 y.o.-year-old female, initially referred by her cardiologist, Dr. Haroldine Laws (previously saw Dr. 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 She was seeing endocrinology in [place], [place], Dr. Bobbe Medico, now retired.  She is here with her daughter who offers part of the history, especially regarding her symptoms and past medical history.  Pt was found to have worsening Osteoporosis >> started Fosamax 07/2018.   Reviewed and addended history: In 08/2016, she started to feel very fatigued, weak, also with decreased appetite, weight loss (20 lbs in 3 mo before our first appt).   Her fatigue has improved, but it is still present.  Hypercalcemia  - dx in 2004 - was followed by her endocrinologist in [place]. They were following the levels of the calcium and PTH. She mentions that her calcium started to increase in Fall 2017.   She refused parathyroid surgery in the past, however, at last visit, she agreed with a referral to surgery if absolutely needed.  However, at last check, her PTH, calcium, vitamin D were all normal, much improved from before.  Reviewed patient's pertinent labs: 07/08/2018 records from PCP: Ca 10.9 (8.5-10.1), PTH 157 (18-80) Lab Results  Component Value Date   PTH 45 02/25/2018   PTH 76 (H) 04/13/2017   CALCIUM 9.1 02/25/2018   CALCIUM 10.4 04/13/2017   CALCIUM 11.1 (H) 01/02/2017  07/13/2017: PTH 210, calcium: 10.4 (8.5-10.1) 12/22/2016: Calcium 10.8 (8.5-10.1), iCa 5.91 (4.57-5.43) PTH 256 12/17/2016: Calcium 10.4 (8.5-10.1), Mg  06/25/2015: iCa 1.43 (1.12-1.32), PTH 159  Her 24-hour urine calcium level was normal (although she had a slightly low creatinine): Component     Latest Ref Rng & Units 05/14/2017  Calcium, Ur     Not estab mg/dL 8  Calcium, 24 hour urine     35 - 250 mg/24 h 192  Creatinine,  Urine     20 - 320 mg/dL 24  Creatinine, 24H Ur     0.63 - 2.50 g/24 h 0.58 (L)   She has osteoporosis.   Most recent DEXA scan: 07/13/2018:  L spine -2.2 (-5.7%*) L hip: -2.9 (no change)  No fractures, but had several falls in the past.  Last in 2017.  She has a history of 3 kidney stones, last in 2015, for which she had lithotripsy.  She does have CKD and sees nephrology. Last BUN/Cr: 07/08/2018: 26/1.6 Lab Results  Component Value Date   BUN 29 (H) 02/25/2018   BUN 26 (H) 04/13/2017   CREATININE 1.64 (H) 02/25/2018   CREATININE 1.47 (H) 04/13/2017  02/03/2018:  BUN/Cr 25/1.7, GFR 35 12/22/2016: 20/1.4, EGFR 37 12/17/2016: 18/1.5, EGFR 34  She stopped HCTZ in 10/2016.  Vitamin D deficiency:  Vitamin D levels are normal recently: Lab Results  Component Value Date   VD25OH 59.73 02/25/2018   VD25OH 46.04 04/13/2017   VD25OH 25.93 (L) 02/06/2017  07/13/2017: 43.3 12/17/2016: Vitamin D 22.8 06/25/2015: Vitamin D 19.2 Her cardiologist added a vitamin D 2000 IU/day >> we increased the dose to 4000 units in 01/2017 and subsequent vitamin D levels were normal.  Pt does not have a FH of hypercalcemia, pituitary tumors. Mother and sister had osteoporosis.  Sister and daughter had thyroid cancer.  Hypothyroidism  -Apparently diagnosed before starting amiodarone.  She was able to reduce the dose of levothyroxine when her amiodarone dose was reduced in 2018.  However,  we then increase the dose to 75 mcg daily  Pt is now on levothyroxine 75  mcg (1.5 of 50 mg tablets) recently reduced only 6 out of 7 days, taken: - in am - fasting - at least 30 min from b'fast - no Ca, Fe, MVI, PPIs - not on Biotin  She feels much better after her vitamin D and TSH levels have normalized, but she still has fatigue.  Latest TFTs were normal: 07/08/2018: TSH apparently suppressed (I do not have these records), after which her levothyroxine dose was reduced Lab Results  Component Value Date    TSH 2.05 02/25/2018   TSH 1.35 04/13/2017   TSH 3.59 02/06/2017   FREET4 1.58 02/25/2018   FREET4 1.45 04/13/2017   FREET4 1.40 02/06/2017  07/13/2017: 1.07 12/17/2016: TSH 1.69  07/13/2017: TPO Abs <28  She and her husband are driving from 5 hours away to come see their daughter.  ROS: Constitutional: + Weight gain, + fatigue, no subjective hyperthermia, + subjective hypothermia, + nocturia Eyes: no blurry vision, no xerophthalmia ENT: no sore throat, no nodules palpated in throat, no dysphagia, no odynophagia, no hoarseness Cardiovascular: no CP/no SOB/no palpitations/no leg swelling Respiratory: no cough/no SOB/no wheezing Gastrointestinal: no N/no V/no D/no C/no acid reflux Musculoskeletal: no muscle aches/+ joint aches Skin: no rashes, + hair loss Neurological: no tremors/no numbness/no tingling/no dizziness  I reviewed pt's medications, allergies, PMH, social hx, family hx, and changes were documented in the history of present illness. Otherwise, unchanged from my initial visit note.  She increase the dose of amlodipine per PCPs advice.  Currently on 10 mg.  Patient Active Problem List   Diagnosis Date Noted  . Vitamin D insufficiency 02/06/2017    Priority: High  . Hypothyroidism 02/06/2017    Priority: High  . Hyperparathyroidism ([place]) 02/06/2017    Priority: High  . Osteoporosis 02/06/2017    Priority: High  . Glucose intolerance (impaired glucose tolerance) 02/06/2017  . Essential hypertension 02/06/2017  . Atrial fibrillation ([place]) 02/06/2017  . OSA on CPAP 02/06/2017  . B12 deficiency 02/06/2017  . Hyperlipidemia 02/06/2017  . IDA (iron deficiency anemia) 02/06/2017   Past Surgical History:  Procedure Laterality Date  . TONSILLECTOMY  1956   . WRIST ARTHROSCOPY Left 1994   Also: - Hysterectomy 1989 - Lithotripsy x3 - Cataract L and R 2018   Social History   Social History  . Marital status: Married    Spouse name: N/A  . Number of children: 5  (1 daughter lives in [place])   Occupational History  . n/a   Social History Main Topics  . Smoking status: Never Smoker  . Smokeless tobacco: Never Used  . Alcohol use No  . Drug use: No   Current Outpatient Medications on File Prior to Visit  Medication Sig Dispense Refill  . amiodarone (PACERONE) 100 MG tablet Take 100 mg by mouth every morning.    Marland Kitchen amLODipine (NORVASC) 5 MG tablet Take 5 mg by mouth every evening.    Marland Kitchen apixaban (ELIQUIS) 5 MG TABS tablet Take 5 mg by mouth 2 (two) times daily.    . Cholecalciferol (VITAMIN D-3) 1000 units CAPS Take 2,000 Units by mouth every morning.    . cloNIDine (CATAPRES) 0.1 MG tablet Take 0.1 mg by mouth See admin instructions. TWO TIMES A DAY ONLY IF SYSTOLIC READING IS 245 OR GREATER    . iron polysaccharides (FERREX 150) 150 MG capsule Take 150 mg by mouth every evening.    Marland Kitchen  ketorolac (ACULAR) 0.4 % SOLN Place 1 drop into the left eye 4 (four) times daily. For 2 weeks (filled: 12/30/16)    . levothyroxine (SYNTHROID, LEVOTHROID) 75 MCG tablet Take 1 tablet (75 mcg total) by mouth daily. 60 tablet 2  . lisinopril (PRINIVIL,ZESTRIL) 40 MG tablet Take 40 mg by mouth every morning.     . metoCLOPramide (REGLAN) 10 MG tablet Take 0.5 tablets (5 mg total) by mouth every 6 (six) hours as needed for nausea. 4 tablet 0  . potassium chloride (K-DUR,KLOR-CON) 10 MEQ tablet Take 10 mEq by mouth every evening.    . prednisoLONE acetate (PRED FORTE) 1 % ophthalmic suspension Place 1 drop into the left eye See admin instructions. Four times a day for 14 days then two times a day for 14 days    . traZODone (DESYREL) 50 MG tablet Take 25-50 mg by mouth at bedtime as needed for sleep.    Marland Kitchen trimethoprim-polymyxin b (POLYTRIM) ophthalmic solution Place 1 drop into the left eye See admin instructions. Four times a day for 7 days then 1 drop four times a day starting 3 days before day of surgery    . vitamin B-12 (CYANOCOBALAMIN) 1000 MCG tablet Take 1,000 mcg  by mouth daily.     No current facility-administered medications on file prior to visit.    Allergies  Allergen Reactions  . Ciprofloxacin Hives  . Digoxin And Related Hives    And blisters (eyes & lips)  . Sulfa Antibiotics Hives  . Tape Other (See Comments)    Needs tape for SENSITIVE SKIN  . Clindamycin/Lincomycin Rash   Family History  Problem Relation Age of Onset  . Hypertension Mother   . Heart disease Mother   . Diabetes Child   . Hypertension Child   . Thyroid disease Child   . Cancer Child     PE: BP 118/76   Pulse 86   Ht 5' 4"  (1.626 m) Comment: measured  Wt 176 lb (79.8 kg)   SpO2 98%   BMI 30.21 kg/m  Wt Readings from Last 3 Encounters:  08/27/18 176 lb (79.8 kg)  02/25/18 170 lb 6.4 oz (77.3 kg)  05/14/17 157 lb (71.2 kg)   Constitutional: overweight, in NAD Eyes: PERRLA, EOMI, no exophthalmos ENT: moist mucous membranes, no thyromegaly, no cervical lymphadenopathy Cardiovascular: RRR, No MRG, + LE swelling - pitting, Bilateral Respiratory: CTA B Gastrointestinal: abdomen soft, NT, ND, BS+ Musculoskeletal: no deformities, strength intact in all 4 Skin: moist, warm, no rashes Neurological: no tremor with outstretched hands, DTR normal in all 4  Assessment: 1. Hypercalcemia/hyperparathyroidism - She does have complications from hypercalcemia: + h/o nephrolithiasis, + osteoporosis, no fractures. No abdominal pain, depression, bone pain.  2. Vit D insufficiency  3. Hypothyroidism  Plan: 1. And 2.  Patient with a history of elevated calcium (highest 11.1) and also very high intact PTH, at 256 (corresponding to a calcium level of 10.8).  However, since the above results, her PTH and calcium decreased so that at last visit, they were all normal.  A 24-hour urine calcium was also normal, as was her vitamin D.  At this visit, she brings records from PCP in which the calcium and the PTH were both high. We discussed in the past about the fact that she may  still have primary hyperparathyroidism and she was initially against the idea of parathyroidectomy.  However, at last visit, she mentioned that she would agree with a referral to surgery if absolutely needed  and also she wanted to see if this would improve her significant fatigue.  Since the tests were all normal, we did not go ahead with a referral to surgery at last visit.  Of note, if we do proceed with surgery in the future, she is on Eliquis and will need to be bridged with heparin before surgery. We we will repeat calcium, PTH ([facility] assay), vitamin D today.  If calcium and PTH are still abnormal, we all agreed that she needs a referral to surgery, especially in the light of her worsening osteoporosis. - If her PTH and calcium are elevated, she does meet criteria for surgery: Increased calcium by more than 1 mg/dL above the upper limit of normal  Kidney ds.  Osteoporosis (or Vb fx) Age <28 years old New (2013): High UCa >400 mg/d and increased stone risk by biochemical stone risk analysis Presence of nephrolithiasis or nephrocalcinosis Pt's preference!  - She continues on 4000 units vitamin D daily - I will see the patient back in 6 months  3. Hypothyroidism - Reportedly diagnosed before starting amiodarone - latest thyroid labs reviewed with pt >> normal 01/2018 - she continues on LT4 75 mcg daily 6/7 days as her TSH was a little low pwer review of PCP's ;etter to pt >> will be checked again by PCP in 1.5 mo - pt feels good on this dose. - we discussed about taking the thyroid hormone every day, with water, >30 minutes before breakfast, separated by >4 hours from acid reflux medications, calcium, iron, multivitamins. Pt. is taking it correctly.  Fax results to Dr. Ophelia Shoulder (fax [phone]) and Dr. Candiss Norse.   Component     Latest Ref Rng & Units 08/27/2018  Glucose     65 - 99 mg/dL 94  BUN     7 - 25 mg/dL 37 (H)  Creatinine     0.60 - 0.93 mg/dL 1.71 (H)  GFR, Est Non African  American     > OR = 60 mL/min/1.63m 28 (L)  GFR, Est African American     > OR = 60 mL/min/1.758m33 (L)  BUN/Creatinine Ratio     6 - 22 (calc) 22  Sodium     135 - 146 mmol/L 138  Potassium     3.5 - 5.3 mmol/L 4.7  Chloride     98 - 110 mmol/L 105  CO2     20 - 32 mmol/L 24  Calcium     8.6 - 10.4 mg/dL 10.4  VITD     30.00 - 100.00 ng/mL 69.16  PTH, Intact     15 - 65 pg/mL 118 (H)   Creatinine higher.  Calcium at the upper limit of normal (probably not higher due to use of Fosamax), PTH high. Will refer to surgery for possible parathyroidectomy.  CrPhilemon KingdomMD PhD [facility]

## 2018-08-28 LAB — PARATHYROID HORMONE, INTACT (NO CA): PTH: 118 pg/mL — ABNORMAL HIGH (ref 15–65)

## 2018-09-06 ENCOUNTER — Other Ambulatory Visit (INDEPENDENT_AMBULATORY_CARE_PROVIDER_SITE_OTHER): Payer: Self-pay | Admitting: Internal Medicine

## 2018-09-06 DIAGNOSIS — I10 Essential (primary) hypertension: Secondary | ICD-10-CM

## 2018-09-06 MED ORDER — AMLODIPINE 10 MG TABLET
10.0000 mg | ORAL_TABLET | Freq: Every day | ORAL | 4 refills | Status: DC
Start: 2018-09-06 — End: 2019-10-06

## 2018-09-08 DIAGNOSIS — Z1329 Encounter for screening for other suspected endocrine disorder: Secondary | ICD-10-CM | POA: Diagnosis not present

## 2018-09-08 DIAGNOSIS — E039 Hypothyroidism, unspecified: Secondary | ICD-10-CM | POA: Diagnosis not present

## 2018-09-08 LAB — THYROID STIMULATING HORMONE (SENSITIVE TSH): TSH: 3.11 u[IU]/mL (ref 0.350–5.550)

## 2018-09-08 LAB — THYROXINE, FREE (FREE T4): THYROXINE, FREE (FREE T4): 1.8 ng/dL (ref 0.9–1.8)

## 2018-10-05 ENCOUNTER — Other Ambulatory Visit (INDEPENDENT_AMBULATORY_CARE_PROVIDER_SITE_OTHER): Payer: Self-pay | Admitting: Internal Medicine

## 2018-10-05 DIAGNOSIS — M810 Age-related osteoporosis without current pathological fracture: Secondary | ICD-10-CM

## 2018-10-07 MED ORDER — ALENDRONATE 35 MG TABLET
35.0000 mg | ORAL_TABLET | ORAL | 3 refills | Status: DC
Start: 2018-10-07 — End: 2019-10-06

## 2018-10-19 DIAGNOSIS — D351 Benign neoplasm of parathyroid gland: Secondary | ICD-10-CM | POA: Diagnosis not present

## 2018-10-25 ENCOUNTER — Other Ambulatory Visit (HOSPITAL_COMMUNITY): Payer: Self-pay | Admitting: Surgery

## 2018-10-25 DIAGNOSIS — D351 Benign neoplasm of parathyroid gland: Secondary | ICD-10-CM

## 2018-11-09 ENCOUNTER — Encounter (HOSPITAL_COMMUNITY)
Admission: RE | Admit: 2018-11-09 | Discharge: 2018-11-09 | Disposition: A | Discharge status: Home / Self Care | Payer: Medicare Other | Source: Ambulatory Visit | Attending: Surgery | Admitting: Surgery

## 2018-11-09 ENCOUNTER — Ambulatory Visit (HOSPITAL_COMMUNITY)
Admission: RE | Admit: 2018-11-09 | Discharge: 2018-11-09 | Disposition: A | Discharge status: Home / Self Care | Payer: Medicare Other | Source: Ambulatory Visit | Attending: Surgery | Admitting: Surgery

## 2018-11-09 DIAGNOSIS — D351 Benign neoplasm of parathyroid gland: Secondary | ICD-10-CM | POA: Insufficient documentation

## 2018-11-09 DIAGNOSIS — E079 Disorder of thyroid, unspecified: Secondary | ICD-10-CM | POA: Diagnosis not present

## 2018-11-09 MED ORDER — TECHNETIUM TC 99M SESTAMIBI - CARDIOLITE
25.0000 | Freq: Once | INTRAVENOUS | Status: AC | PRN
  Administered 2018-11-09: 11:00:00 25 via INTRAVENOUS

## 2018-11-10 DIAGNOSIS — H3589 Other specified retinal disorders: Secondary | ICD-10-CM | POA: Diagnosis not present

## 2018-11-10 DIAGNOSIS — H26491 Other secondary cataract, right eye: Secondary | ICD-10-CM | POA: Diagnosis not present

## 2018-11-10 DIAGNOSIS — Z961 Presence of intraocular lens: Secondary | ICD-10-CM | POA: Diagnosis not present

## 2018-12-02 ENCOUNTER — Other Ambulatory Visit: Payer: Self-pay | Admitting: Surgery

## 2018-12-02 DIAGNOSIS — D351 Benign neoplasm of parathyroid gland: Secondary | ICD-10-CM

## 2018-12-06 DIAGNOSIS — I4891 Unspecified atrial fibrillation: Secondary | ICD-10-CM | POA: Diagnosis not present

## 2018-12-06 DIAGNOSIS — J984 Other disorders of lung: Secondary | ICD-10-CM | POA: Diagnosis not present

## 2018-12-06 DIAGNOSIS — G4713 Recurrent hypersomnia: Secondary | ICD-10-CM | POA: Diagnosis not present

## 2018-12-06 DIAGNOSIS — R0602 Shortness of breath: Secondary | ICD-10-CM | POA: Diagnosis not present

## 2018-12-06 DIAGNOSIS — G4733 Obstructive sleep apnea (adult) (pediatric): Secondary | ICD-10-CM | POA: Diagnosis not present

## 2018-12-07 ENCOUNTER — Ambulatory Visit
Admission: RE | Admit: 2018-12-07 | Discharge: 2018-12-07 | Disposition: A | Discharge status: Home / Self Care | Payer: Medicare Other | Source: Ambulatory Visit | Attending: Surgery | Admitting: Surgery

## 2018-12-07 DIAGNOSIS — D351 Benign neoplasm of parathyroid gland: Secondary | ICD-10-CM

## 2018-12-08 DIAGNOSIS — Z961 Presence of intraocular lens: Secondary | ICD-10-CM | POA: Diagnosis not present

## 2018-12-08 DIAGNOSIS — H3589 Other specified retinal disorders: Secondary | ICD-10-CM | POA: Diagnosis not present

## 2018-12-14 ENCOUNTER — Other Ambulatory Visit: Payer: Self-pay | Admitting: Surgery

## 2018-12-14 DIAGNOSIS — D351 Benign neoplasm of parathyroid gland: Secondary | ICD-10-CM

## 2018-12-15 ENCOUNTER — Other Ambulatory Visit (INDEPENDENT_AMBULATORY_CARE_PROVIDER_SITE_OTHER): Payer: Self-pay | Admitting: Internal Medicine

## 2018-12-15 MED ORDER — LISINOPRIL 40 MG TABLET
ORAL_TABLET | ORAL | 3 refills | Status: DC
Start: 2018-12-15 — End: 2020-03-22

## 2018-12-27 DIAGNOSIS — Z1231 Encounter for screening mammogram for malignant neoplasm of breast: Secondary | ICD-10-CM | POA: Diagnosis not present

## 2018-12-28 ENCOUNTER — Ambulatory Visit
Admission: RE | Admit: 2018-12-28 | Discharge: 2018-12-28 | Disposition: A | Discharge status: Home / Self Care | Payer: Medicare Other | Source: Ambulatory Visit | Attending: Surgery | Admitting: Surgery

## 2018-12-28 ENCOUNTER — Other Ambulatory Visit (INDEPENDENT_AMBULATORY_CARE_PROVIDER_SITE_OTHER): Payer: Self-pay

## 2018-12-28 DIAGNOSIS — D351 Benign neoplasm of parathyroid gland: Secondary | ICD-10-CM

## 2018-12-28 MED ORDER — GADOBENATE DIMEGLUMINE 529 MG/ML IV SOLN
8.0000 mL | Freq: Once | INTRAVENOUS | Status: AC | PRN
  Administered 2018-12-28: 8 mL via INTRAVENOUS

## 2019-01-04 ENCOUNTER — Ambulatory Visit: Payer: Self-pay | Admitting: Surgery

## 2019-01-10 ENCOUNTER — Encounter (INDEPENDENT_AMBULATORY_CARE_PROVIDER_SITE_OTHER): Payer: Self-pay | Admitting: Internal Medicine

## 2019-01-10 DIAGNOSIS — Z1211 Encounter for screening for malignant neoplasm of colon: Secondary | ICD-10-CM | POA: Diagnosis not present

## 2019-01-10 DIAGNOSIS — M810 Age-related osteoporosis without current pathological fracture: Secondary | ICD-10-CM | POA: Diagnosis not present

## 2019-01-10 DIAGNOSIS — N952 Postmenopausal atrophic vaginitis: Secondary | ICD-10-CM | POA: Diagnosis not present

## 2019-01-13 ENCOUNTER — Ambulatory Visit (INDEPENDENT_AMBULATORY_CARE_PROVIDER_SITE_OTHER): Payer: Medicare Other | Admitting: Internal Medicine

## 2019-01-13 ENCOUNTER — Other Ambulatory Visit: Payer: Self-pay

## 2019-01-13 ENCOUNTER — Encounter (INDEPENDENT_AMBULATORY_CARE_PROVIDER_SITE_OTHER): Payer: Self-pay | Admitting: Internal Medicine

## 2019-01-13 VITALS — BP 142/86 | HR 80 | Ht 64.0 in | Wt 176.2 lb

## 2019-01-13 DIAGNOSIS — M542 Cervicalgia: Principal | ICD-10-CM

## 2019-01-13 DIAGNOSIS — Z6830 Body mass index (BMI) 30.0-30.9, adult: Secondary | ICD-10-CM

## 2019-01-13 DIAGNOSIS — Z9989 Dependence on other enabling machines and devices: Secondary | ICD-10-CM

## 2019-01-13 DIAGNOSIS — M546 Pain in thoracic spine: Secondary | ICD-10-CM

## 2019-01-13 DIAGNOSIS — G8929 Other chronic pain: Secondary | ICD-10-CM

## 2019-01-13 DIAGNOSIS — I10 Essential (primary) hypertension: Secondary | ICD-10-CM

## 2019-01-13 DIAGNOSIS — I480 Paroxysmal atrial fibrillation: Secondary | ICD-10-CM

## 2019-01-13 DIAGNOSIS — M810 Age-related osteoporosis without current pathological fracture: Secondary | ICD-10-CM

## 2019-01-13 DIAGNOSIS — E213 Hyperparathyroidism, unspecified: Secondary | ICD-10-CM

## 2019-01-13 DIAGNOSIS — G4733 Obstructive sleep apnea (adult) (pediatric): Secondary | ICD-10-CM

## 2019-01-13 DIAGNOSIS — M47814 Spondylosis without myelopathy or radiculopathy, thoracic region: Secondary | ICD-10-CM | POA: Diagnosis not present

## 2019-01-13 DIAGNOSIS — M50322 Other cervical disc degeneration at C5-C6 level: Secondary | ICD-10-CM | POA: Diagnosis not present

## 2019-01-13 DIAGNOSIS — M47812 Spondylosis without myelopathy or radiculopathy, cervical region: Secondary | ICD-10-CM | POA: Diagnosis not present

## 2019-01-13 NOTE — Progress Notes (Signed)
INTERNAL MED, MEDICAL STAFF OFFICE BLDG  [address]  [place] [address]  [facility]    Cynthia Barrera  Date of Service: 01/13/2019    Chief Complaint:   Chief Complaint   Patient presents with   . Other     Having parathyroid surgery soon   . Neck Pain       History  HPI  To have the repeat pft around 06/2019     Pending surgery for parathyroid at [facility] pending clearance from cards. To see cards next week.     Continue to have pain in the back between shoulder with any activity using heating pad which helps in 4-5 min then hurts again.no rad from lower cervical and upper throsic.      12/06/2018 pulm dr Epifanio Lesches osa no change plan to recheck dlco if continue to drop then ct chest and may have to stop amnio    08/27/18 [facility] dr gherghe for parahyperthyroid  On there labs parathyroid  And calcium improved  Therefore was to recheck if abnormal then surgery referral else monitor  Component Value Date   PTH 45 02/25/2018   PTH 76 (H) 04/13/2017   CALCIUM 9.1 02/25/2018   CALCIUM 10.4 04/13/2017   CALCIUM 11.1 (H) 01/02/2017   07/13/2017: PTH 210, calcium: 10.4 (8.5-10.1)  12/22/2016: Calcium 10.8 (8.5-10.1), iCa 5.91 (4.57-5.43) PTH 256  12/17/2016: Calcium 10.4 (8.5-10.1), Mg   06/25/2015: iCa 1.43 (1.12-1.32), PTH 159    12/28/2018 [facility] 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 Alendronate (FOSAMAX) 35 mg Oral Tablet Take 1 Tab (35 mg total) by mouth Every 7 days   . amiodarone (PACERONE) 100 mg Oral Tablet Take 100 mg by mouth Once a day   . amLODIPine (NORVASC) 10 mg Oral Tablet Take 1 Tab (10 mg total) by mouth Once a day   . apixaban (ELIQUIS) 5 mg Oral Tablet Take 5 mg by mouth Twice daily   . cholecalciferol, vitamin D3, 1,000 unit Oral Tablet Take 2,000 Units by mouth Twice daily    . cloNIDine HCl (CATAPRES) 0.1 mg Oral Tablet Take 0.1 mg by mouth Twice per day as needed   . cyanocobalamin (VITAMIN B 12) 1,000 mcg Oral Tablet Take 1,000 mcg  by mouth Once a day   . levothyroxine (SYNTHROID) 50 mcg Oral Tablet Take 1.5 Tabs (75 mcg total) by mouth Every morning for 337 days   . lisinopril (PRINIVIL) 40 mg Oral Tablet TAKE ONE TABLET BY MOUTH ONCE DAILY   . polysaccharide iron complex (FERREX 150) 150 mg iron Oral Capsule TAKE 1 CAPSULE BY MOUTH ONCE DAILY   . potassium chloride (K-DUR) 10 mEq Oral Tab Sust.Rel. Particle/Crystal Take 10 mEq by mouth Once a day   . traZODone (DESYREL) 50 mg Oral Tablet Take 1 Tab (50 mg total) by mouth Every night       Review of Systems    Examination  Vitals: BP (!) 142/86   Pulse 80   Ht 1.626 m (5\' 4" )   Wt 79.9 kg (176 lb 3.2 oz)   BMI 30.24 kg/m       Physical Exam  Vitals signs reviewed.   Constitutional:       General: She is not in acute distress.     Appearance: Normal appearance. She is obese. She is not toxic-appearing.   Neck:      Musculoskeletal: Normal range of motion.  Cardiovascular:      Rate and Rhythm: Normal rate and regular rhythm.   Pulmonary:      Effort: Pulmonary effort is normal. No respiratory distress.      Breath sounds: No wheezing.   Abdominal:      General: Bowel sounds are normal.      Palpations: Abdomen is soft.   Musculoskeletal:      Comments: No tenderness of the cervical or throsic spine with palpation of posterior spine.    Full rom of the cervical with flexion and extention     But area of pain with activity over the c5-t3 level  And some tenderness of the rt paraspinal soft tissue with palpation  No rad of the pain   Lymphadenopathy:      Cervical: No cervical adenopathy.   Skin:     General: Skin is warm and dry.   Neurological:      Mental Status: She is alert.       Ortho Exam    Results  12/27/2018 mammo neg    07/2018 osteoporosis   Left Hip t -2.9  Lumbar t=-2.2              Diagnosis and Plan  1. Neck pain    2. Chronic midline thoracic back pain    3. Paroxysmal atrial fibrillation (CMS HCC)    4. Hyperparathyroidism (CMS [place])    5. Osteoporosis, unspecified  osteoporosis type, unspecified pathological fracture presence    6. Essential hypertension    7. OSA on CPAP    1. Neck pain  Most likely due to soft tissue add otc lidocaine cream   But get xray to ensure not compression fx as source due to location will need cervical and thoracic to cover area of interest.   - XR CERVICAL SPINE SERIES W/FLEX EXT [IMG59]; Future  - XR CERVICAL SPINE SERIES W/FLEX EXT [IMG59]    2. Chronic midline thoracic back pain  See above   - XR THORACIC SPINE [IMG62]; Future  - XR THORACIC SPINE [IMG62]    3. Paroxysmal atrial fibrillation (CMS HCC)  Reg for now but per pulm note may need to take off amnio if dlco continue to worsen    Did ask pt to let pulm know of the pending surgery to see if they want to repeat pfts sooner.     To see cards for clearance next week    4. Hyperparathyroidism (CMS [place])   pending surgery for removal in [place]     5. Osteoporosis, unspecified osteoporosis type, unspecified pathological fracture presence  tol the fosamax continue    6. Essential hypertension  Stable     7. OSA on CPAP  Using cpap   Orders Placed This Encounter   . XR CERVICAL SPINE SERIES W/FLEX EXT [IMG59]   . XR THORACIC SPINE [IMG62]       Darleen Crocker, DO

## 2019-01-13 NOTE — Patient Instructions (Signed)
Please get some 4% lidocaine cream and apply to area of pain.

## 2019-01-19 DIAGNOSIS — I480 Paroxysmal atrial fibrillation: Secondary | ICD-10-CM | POA: Diagnosis not present

## 2019-01-19 DIAGNOSIS — R5383 Other fatigue: Secondary | ICD-10-CM | POA: Diagnosis not present

## 2019-01-19 DIAGNOSIS — Z01810 Encounter for preprocedural cardiovascular examination: Secondary | ICD-10-CM | POA: Diagnosis not present

## 2019-01-19 DIAGNOSIS — E782 Mixed hyperlipidemia: Secondary | ICD-10-CM | POA: Diagnosis not present

## 2019-01-19 DIAGNOSIS — I10 Essential (primary) hypertension: Secondary | ICD-10-CM | POA: Diagnosis not present

## 2019-01-24 DIAGNOSIS — Z01810 Encounter for preprocedural cardiovascular examination: Secondary | ICD-10-CM | POA: Diagnosis not present

## 2019-01-24 DIAGNOSIS — R5383 Other fatigue: Secondary | ICD-10-CM | POA: Diagnosis not present

## 2019-01-24 DIAGNOSIS — I480 Paroxysmal atrial fibrillation: Secondary | ICD-10-CM | POA: Diagnosis not present

## 2019-02-09 DIAGNOSIS — M810 Age-related osteoporosis without current pathological fracture: Secondary | ICD-10-CM | POA: Diagnosis not present

## 2019-02-09 DIAGNOSIS — N184 Chronic kidney disease, stage 4 (severe): Secondary | ICD-10-CM | POA: Diagnosis not present

## 2019-02-09 DIAGNOSIS — D649 Anemia, unspecified: Secondary | ICD-10-CM | POA: Diagnosis not present

## 2019-02-09 DIAGNOSIS — I129 Hypertensive chronic kidney disease with stage 1 through stage 4 chronic kidney disease, or unspecified chronic kidney disease: Secondary | ICD-10-CM | POA: Diagnosis not present

## 2019-02-09 DIAGNOSIS — N281 Cyst of kidney, acquired: Secondary | ICD-10-CM | POA: Diagnosis not present

## 2019-02-09 DIAGNOSIS — R808 Other proteinuria: Secondary | ICD-10-CM | POA: Diagnosis not present

## 2019-02-09 DIAGNOSIS — E876 Hypokalemia: Secondary | ICD-10-CM | POA: Diagnosis not present

## 2019-02-25 ENCOUNTER — Ambulatory Visit: Payer: Medicare Other | Admitting: Internal Medicine

## 2019-04-04 DIAGNOSIS — I480 Paroxysmal atrial fibrillation: Secondary | ICD-10-CM | POA: Diagnosis not present

## 2019-04-04 DIAGNOSIS — E039 Hypothyroidism, unspecified: Secondary | ICD-10-CM | POA: Diagnosis not present

## 2019-04-09 ENCOUNTER — Other Ambulatory Visit (INDEPENDENT_AMBULATORY_CARE_PROVIDER_SITE_OTHER): Payer: Self-pay | Admitting: Internal Medicine

## 2019-04-09 DIAGNOSIS — D509 Iron deficiency anemia, unspecified: Secondary | ICD-10-CM

## 2019-04-12 ENCOUNTER — Ambulatory Visit: Payer: Self-pay | Admitting: Surgery

## 2019-04-12 DIAGNOSIS — I129 Hypertensive chronic kidney disease with stage 1 through stage 4 chronic kidney disease, or unspecified chronic kidney disease: Secondary | ICD-10-CM | POA: Diagnosis not present

## 2019-04-12 DIAGNOSIS — N184 Chronic kidney disease, stage 4 (severe): Secondary | ICD-10-CM | POA: Diagnosis not present

## 2019-04-12 NOTE — H&P (Signed)
General Surgery [facility]  Teresa Caldwell DOB: [birth year] Married / Language: Cleophus Molt / Race: White Female   History of Present Illness  The patient is a 78 year old female who presents with primary hyperparathyroidism.  CHIEF COMPLAINT: primary hyperparathyroidism  Patient is referred by Dr. Philemon Kingdom for surgical evaluation and management of suspected primary hyperparathyroidism. Patient has had long-standing hypercalcemia. She has had markedly elevated intact PTH levels on occasion. She has had a normal 24-hour urine collection for calcium. Patient does have complications including osteoporosis, history of nephrolithiasis, and chronic fatigue. She also has chronic kidney disease. Patient lives in [place]. However, her daughter lives in [place], [place]. Her endocrinologist in [place] has retired. Patient has transferred her care here to [facility] Patient is referred today by endocrinology for surgical evaluation for possible parathyroidectomy. There is a family history of thyroid disease in multiple family members including thyroid cancer. There is no family history of parathyroid disease or other endocrine neoplasms. Patient has had no prior head or neck surgery. She presents today accompanied by her husband.   Past Surgical History Cataract Surgery  Bilateral. Hysterectomy (not due to cancer) - Complete  Tonsillectomy   Diagnostic Studies History Mammogram  within last year Pap Smear  1-5 years ago  Allergies Plaquenil *ANTIMALARIALS*  Sulfa Antibiotics  Digoxin *CARDIOTONICS*  Clindamycin  Ciprofloxacin *CHEMICALS*  Bactrim *ANTI-INFECTIVE AGENTS - MISC.*  Allergies Reconciled   Medication History Alendronate Sodium (35MG  Tablet, Oral) Active. Pacerone (100MG  Tablet, Oral) Active. amLODIPine Besylate (10MG  Tablet, Oral) Active. Eliquis (5MG  Tablet, Oral) Active. Vitamin D (2000UNIT  Tablet, Oral) Active. clonazePAM (1MG  Tablet Disint, Oral) Active. Iron (Oral) Specific strength unknown - Active. Lisinopril (40MG  Tablet, Oral) Active. Potassium Chloride ER (10MEQ Tablet ER, Oral) Active. Vitamin B12 (1000MCG Tablet ER, Oral) Active. Levothyroxine Sodium (50MCG Tablet, Oral) Active. traZODone HCl (50MG  Tablet, Oral) Active. Ferrex 150 (150MG  Capsule, Oral) Active. Medications Reconciled  Pregnancy / Birth History Age at menarche  14 years. Age of menopause  34-55 Gravida  5 Maternal age  77-20 Para  5  Other Problems Arthritis  Atrial Fibrillation  Back Pain  High blood pressure  Kidney Stone  Sleep Apnea  Thyroid Disease   Review of Systems  General Present- Fatigue. Not Present- Appetite Loss, Chills, Fever, Night Sweats, Weight Gain and Weight Loss. Skin Present- Dryness. Not Present- Change in Wart/Mole, Hives, Jaundice, New Lesions, Non-Healing Wounds, Rash and Ulcer. HEENT Present- Hearing Loss and Wears glasses/contact lenses. Not Present- Earache, Hoarseness, Nose Bleed, Oral Ulcers, Ringing in the Ears, Seasonal Allergies, Sinus Pain, Sore Throat, Visual Disturbances and Yellow Eyes. Respiratory Not Present- Bloody sputum, Chronic Cough, Difficulty Breathing, Snoring and Wheezing. Breast Not Present- Breast Mass, Breast Pain, Nipple Discharge and Skin Changes. Cardiovascular Present- Swelling of Extremities. Not Present- Chest Pain, Difficulty Breathing Lying Down, Leg Cramps, Palpitations, Rapid Heart Rate and Shortness of Breath. Gastrointestinal Not Present- Abdominal Pain, Bloating, Bloody Stool, Change in Bowel Habits, Chronic diarrhea, Constipation, Difficulty Swallowing, Excessive gas, Gets full quickly at meals, Hemorrhoids, Indigestion, Nausea, Rectal Pain and Vomiting. Female Genitourinary Present- Frequency and Nocturia. Not Present- Painful Urination, Pelvic Pain and Urgency. Musculoskeletal Present- Back Pain, Joint  Pain and Muscle Weakness. Not Present- Joint Stiffness, Muscle Pain and Swelling of Extremities. Neurological Not Present- Decreased Memory, Fainting, Headaches, Numbness, Seizures, Tingling, Tremor, Trouble walking and Weakness. Psychiatric Not Present- Anxiety, Bipolar, Change in Sleep Pattern, Depression, Fearful and Frequent crying. Endocrine Present- Cold Intolerance. Not Present- Excessive  Hunger, Hair Changes, Heat Intolerance, Hot flashes and New Diabetes. Hematology Present- Blood Thinners and Easy Bruising. Not Present- Excessive bleeding, Gland problems, HIV and Persistent Infections.  Vitals Weight: 177.6 lb Height: 64in Body Surface Area: 1.86 m Body Mass Index: 30.48 kg/m  Temp.: 96.31F(Temporal)  Pulse: 91 (Regular)  P.OX: 96% (Room air) BP: 132/70 (Sitting, Left Arm, Standard)   Physical Exam   See vital signs recorded above  GENERAL APPEARANCE Development: normal Nutritional status: normal Gross deformities: none  SKIN Rash, lesions, ulcers: none Induration, erythema: none Nodules: none palpable  EYES Conjunctiva and lids: normal Pupils: equal and reactive Iris: normal bilaterally  EARS, NOSE, MOUTH, THROAT External ears: no lesion or deformity External nose: no lesion or deformity Hearing: grossly normal Lips: no lesion or deformity Dentition: normal for age Oral mucosa: moist  NECK Symmetric: yes Trachea: midline Thyroid: no palpable nodules in the thyroid bed  CHEST Respiratory effort: normal Retraction or accessory muscle use: no Breath sounds: normal bilaterally Rales, rhonchi, wheeze: none  CARDIOVASCULAR Auscultation: regular rhythm, normal rate Murmurs: none Pulses: carotid and radial pulse 2+ palpable Lower extremity edema: bilateral edema at ankles Lower extremity varicosities: none  MUSCULOSKELETAL Station and gait: normal Digits and nails: no clubbing or cyanosis Muscle strength: grossly normal all  extremities Range of motion: grossly normal all extremities Deformity: none  LYMPHATIC Cervical: none palpable Supraclavicular: none palpable  PSYCHIATRIC Oriented to person, place, and time: yes Mood and affect: normal for situation Judgment and insight: appropriate for situation    Assessment & Plan  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 She is accompanied by her husband. They are provided with written literature on parathyroid disease to review at home.  Patient has long-standing biochemical evidence of primary hyperparathyroidism. She has not had any imaging studies. I would like to obtain an ultrasound examination of the neck to evaluate for an enlarged parathyroid gland and also to evaluate the thyroid. We will also obtain a nuclear medicine parathyroid scan with sestamibi in hopes of identifying the parathyroid adenoma. We will make arrangements for the studies in the near future.  We discussed minimally invasive parathyroid surgery versus the traditional neck exploration. We also discussed the fact that she would require cardiac clearance prior to surgery even her history of atrial fibrillation and the fact that she is on anticoagulation.  Patient will be scheduled for the above studies. We will contact her with the results. If they localize an adenoma, then we will move forward with planning for surgery. If the studies do not localize a parathyroid adenoma, then we will obtain a 4D CT scan with parathyroid protocol in hopes of identifying the adenoma.  Patient and her husband understand this plan and agreed to proceed.  Pt Education - Pamphlet Given - The Parathyroid Surgery Book: discussed with patient and provided information.   ADDENDUM  Telephone call to patient and her  son with MRI results. Localized a right inferior adenoma.  Proceed with scheduling of surgery. Plan overnight stay [place]. Will need cardiac clearance given Afib and anticoagulation. Will need to stop anticoagulant 5 days prior to OR.  The risks and benefits of the procedure have been discussed at length with the patient.  The patient understands the proposed procedure, potential alternative treatments, and the course of recovery to be expected.  All of the patient's questions have been answered at this time.  The patient wishes to proceed with surgery.   Armandina Gemma, [place] [facility] Office: [phone]

## 2019-04-14 DIAGNOSIS — M810 Age-related osteoporosis without current pathological fracture: Secondary | ICD-10-CM | POA: Diagnosis not present

## 2019-04-14 DIAGNOSIS — D631 Anemia in chronic kidney disease: Secondary | ICD-10-CM | POA: Diagnosis not present

## 2019-04-14 DIAGNOSIS — N184 Chronic kidney disease, stage 4 (severe): Secondary | ICD-10-CM | POA: Diagnosis not present

## 2019-04-14 DIAGNOSIS — I129 Hypertensive chronic kidney disease with stage 1 through stage 4 chronic kidney disease, or unspecified chronic kidney disease: Secondary | ICD-10-CM | POA: Diagnosis not present

## 2019-04-14 DIAGNOSIS — R808 Other proteinuria: Secondary | ICD-10-CM | POA: Diagnosis not present

## 2019-04-14 DIAGNOSIS — E876 Hypokalemia: Secondary | ICD-10-CM | POA: Diagnosis not present

## 2019-04-14 DIAGNOSIS — N281 Cyst of kidney, acquired: Secondary | ICD-10-CM | POA: Diagnosis not present

## 2019-04-18 ENCOUNTER — Encounter (INDEPENDENT_AMBULATORY_CARE_PROVIDER_SITE_OTHER): Payer: Self-pay | Admitting: Internal Medicine

## 2019-04-28 ENCOUNTER — Encounter (HOSPITAL_COMMUNITY): Payer: Medicare Other

## 2019-05-02 ENCOUNTER — Encounter (INDEPENDENT_AMBULATORY_CARE_PROVIDER_SITE_OTHER): Payer: Self-pay | Admitting: Internal Medicine

## 2019-05-02 ENCOUNTER — Other Ambulatory Visit: Payer: Self-pay

## 2019-05-02 ENCOUNTER — Ambulatory Visit (INDEPENDENT_AMBULATORY_CARE_PROVIDER_SITE_OTHER): Payer: Medicare Other | Admitting: Internal Medicine

## 2019-05-02 VITALS — BP 142/84 | HR 76 | Ht 64.0 in | Wt 178.0 lb

## 2019-05-02 DIAGNOSIS — M546 Pain in thoracic spine: Secondary | ICD-10-CM

## 2019-05-02 DIAGNOSIS — M542 Cervicalgia: Secondary | ICD-10-CM

## 2019-05-02 DIAGNOSIS — Z6830 Body mass index (BMI) 30.0-30.9, adult: Secondary | ICD-10-CM

## 2019-05-02 DIAGNOSIS — M7989 Other specified soft tissue disorders: Secondary | ICD-10-CM

## 2019-05-02 DIAGNOSIS — G8929 Other chronic pain: Secondary | ICD-10-CM

## 2019-05-02 NOTE — Progress Notes (Signed)
[place]  INTERNAL MED, MEDICAL STAFF OFFICE [facility]  [address] [place] [address]  Phone [phone]  Name: Cynthia Barrera MRN:  [id number]   Date: 05/02/2019 Age: 78 y.o.           Primary Care Physician: Darleen Crocker, DO    Chief Complaint: Follow Up 4 Months (feet and legs swelling )    History: This is a 78 y.o. female who presents for leg swelling    reports worsening leg and feet swelling, started in last month, like that previously years ago, used to be on HCTZ but was taken off of. reports stress test in march and normal    Went to Dr. Ophelia Shoulder and reports worsening kidney function, has follow up appt in a couple months.     back pain: using heating pad, cervical down to shoulder blade, no tingling/numbness in hands    reports BP at home 130/70s    Past Medical, Surgical, Social and Family Histories were reviewed     Medications:   Current Outpatient Medications   Medication Sig   . Alendronate (FOSAMAX) 35 mg Oral Tablet Take 1 Tab (35 mg total) by mouth Every 7 days   . amiodarone (PACERONE) 100 mg Oral Tablet Take 100 mg by mouth Once a day   . amLODIPine (NORVASC) 10 mg Oral Tablet Take 1 Tab (10 mg total) by mouth Once a day   . apixaban (ELIQUIS) 5 mg Oral Tablet Take 5 mg by mouth Twice daily   . cholecalciferol, vitamin D3, 1,000 unit Oral Tablet Take 2,000 Units by mouth Twice daily    . cloNIDine HCl (CATAPRES) 0.1 mg Oral Tablet Take 0.1 mg by mouth Twice per day as needed   . cyanocobalamin (VITAMIN B 12) 1,000 mcg Oral Tablet Take 1,000 mcg by mouth Once a day   . levothyroxine (SYNTHROID) 50 mcg Oral Tablet Take 1.5 Tabs (75 mcg total) by mouth Every morning for 337 days   . lisinopril (PRINIVIL) 40 mg Oral Tablet TAKE ONE TABLET BY MOUTH ONCE DAILY   . polysaccharide iron complex (FERREX 150) 150 mg iron Oral Capsule Take 1 capsule by mouth once daily   . potassium chloride (K-DUR) 10 mEq Oral Tab Sust.Rel. Particle/Crystal Take 10 mEq by mouth Once a day      . traZODone (DESYREL) 50 mg Oral Tablet Take 1 Tab (50 mg total) by mouth Every night       Review of Systems:  Positives: back and neck pain, leg and feet swelling  All other systems have been reviewed and are negative    Physical Exam:  Vitals:BP (!) 142/84   Pulse 76   Ht 1.626 m (5\' 4" )   Wt 80.7 kg (178 lb)   SpO2 97%   BMI 30.55 kg/m       Body mass index is 30.55 kg/m.  General: NAD, Alert and oriented  Head: NCAT  Eye: no icterus or conjunctival injection  Pulmonary: low pitched wheeze bl base  Cardiovascular: RRR  Abdomen: Soft, NT, ND. BS present.   Muskuloskeletal: Normal ROM of upper and lower extremities grossly.   Lymphatic: +1 edema bl extending to shin   Skin: No rashes or ecchymoses. No jaundice.  Neuro:CN II-XII intact grossly  Psych: normal affect    Reviewed Records, imaging and labs in [facility] and [facility]    Assessment and Plan:    ICD-10-CM    1. Leg swelling M79.89  2. 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 Cervical pain M54.2        Thoracic pain:  degenerative changes on xrays 01/2019    Cervical pain:  xray showing minimal disc space narrowing C5-6, left neuroforaminal narrowing C3-4  begin tylenol and voltaren topical    Leg swelling: instructed to speak with Dr. Ophelia Shoulder about lasix, compression socks    Preventative health care:  mammogram 12/27/2018 neg.    Return in about 3 months (around 08/02/2019).  Return to the office for evaluation if have any worsening symptoms.    Carin Hock, DO  05/02/2019, 08:17

## 2019-05-02 NOTE — Patient Instructions (Addendum)
voltaren cream over the counter for neck and back pain  also try getting compression stockings or sock

## 2019-05-05 ENCOUNTER — Ambulatory Visit (HOSPITAL_COMMUNITY): Admission: RE | Admit: 2019-05-05 | Payer: Medicare Other | Source: Home / Self Care | Admitting: Surgery

## 2019-05-05 ENCOUNTER — Encounter (HOSPITAL_COMMUNITY): Admission: RE | Payer: Self-pay | Source: Home / Self Care

## 2019-05-05 SURGERY — PARATHYROIDECTOMY
Anesthesia: General | Laterality: Right

## 2019-05-06 ENCOUNTER — Other Ambulatory Visit (INDEPENDENT_AMBULATORY_CARE_PROVIDER_SITE_OTHER): Payer: Self-pay | Admitting: Internal Medicine

## 2019-05-06 ENCOUNTER — Telehealth (INDEPENDENT_AMBULATORY_CARE_PROVIDER_SITE_OTHER): Payer: Self-pay | Admitting: Internal Medicine

## 2019-05-06 DIAGNOSIS — H3589 Other specified retinal disorders: Secondary | ICD-10-CM | POA: Diagnosis not present

## 2019-05-06 DIAGNOSIS — T378X5S Adverse effect of other specified systemic anti-infectives and antiparasitics, sequela: Secondary | ICD-10-CM | POA: Diagnosis not present

## 2019-05-06 NOTE — Telephone Encounter (Signed)
Husband said her legs are still swelling and look sunburned. [phone]

## 2019-05-09 MED ORDER — TRAZODONE 50 MG TABLET
50.0000 mg | ORAL_TABLET | Freq: Every evening | ORAL | 3 refills | Status: DC
Start: 2019-05-09 — End: 2022-09-15

## 2019-05-09 NOTE — Telephone Encounter (Signed)
Pt with lower ext edema seen on exam on 6/1 by dr Norville Haggard.    Called the number listed and her home number left message to add compression stocking if not using to help with removing edema of the legs.

## 2019-05-23 DIAGNOSIS — H30143 Acute posterior multifocal placoid pigment epitheliopathy, bilateral: Secondary | ICD-10-CM | POA: Diagnosis not present

## 2019-05-23 DIAGNOSIS — R94111 Abnormal electroretinogram [ERG]: Secondary | ICD-10-CM | POA: Diagnosis not present

## 2019-05-23 DIAGNOSIS — H3589 Other specified retinal disorders: Secondary | ICD-10-CM | POA: Diagnosis not present

## 2019-05-23 DIAGNOSIS — T378X5S Adverse effect of other specified systemic anti-infectives and antiparasitics, sequela: Secondary | ICD-10-CM | POA: Diagnosis not present

## 2019-05-27 DIAGNOSIS — N184 Chronic kidney disease, stage 4 (severe): Secondary | ICD-10-CM | POA: Diagnosis not present

## 2019-05-27 DIAGNOSIS — D649 Anemia, unspecified: Secondary | ICD-10-CM | POA: Diagnosis not present

## 2019-05-27 DIAGNOSIS — N281 Cyst of kidney, acquired: Secondary | ICD-10-CM | POA: Diagnosis not present

## 2019-05-27 DIAGNOSIS — M810 Age-related osteoporosis without current pathological fracture: Secondary | ICD-10-CM | POA: Diagnosis not present

## 2019-05-27 DIAGNOSIS — I129 Hypertensive chronic kidney disease with stage 1 through stage 4 chronic kidney disease, or unspecified chronic kidney disease: Secondary | ICD-10-CM | POA: Diagnosis not present

## 2019-05-27 DIAGNOSIS — E213 Hyperparathyroidism, unspecified: Secondary | ICD-10-CM | POA: Diagnosis not present

## 2019-05-27 DIAGNOSIS — R808 Other proteinuria: Secondary | ICD-10-CM | POA: Diagnosis not present

## 2019-06-06 DIAGNOSIS — N281 Cyst of kidney, acquired: Secondary | ICD-10-CM | POA: Diagnosis not present

## 2019-06-06 DIAGNOSIS — R600 Localized edema: Secondary | ICD-10-CM | POA: Diagnosis not present

## 2019-06-06 DIAGNOSIS — E213 Hyperparathyroidism, unspecified: Secondary | ICD-10-CM | POA: Diagnosis not present

## 2019-06-06 DIAGNOSIS — N184 Chronic kidney disease, stage 4 (severe): Secondary | ICD-10-CM | POA: Diagnosis not present

## 2019-06-06 DIAGNOSIS — I129 Hypertensive chronic kidney disease with stage 1 through stage 4 chronic kidney disease, or unspecified chronic kidney disease: Secondary | ICD-10-CM | POA: Diagnosis not present

## 2019-06-09 ENCOUNTER — Ambulatory Visit: Payer: Medicare Other | Admitting: Internal Medicine

## 2019-06-09 DIAGNOSIS — H30143 Acute posterior multifocal placoid pigment epitheliopathy, bilateral: Secondary | ICD-10-CM | POA: Diagnosis not present

## 2019-06-09 DIAGNOSIS — H3589 Other specified retinal disorders: Secondary | ICD-10-CM | POA: Diagnosis not present

## 2019-06-09 DIAGNOSIS — H5362 Acquired night blindness: Secondary | ICD-10-CM | POA: Diagnosis not present

## 2019-06-09 DIAGNOSIS — D8989 Other specified disorders involving the immune mechanism, not elsewhere classified: Secondary | ICD-10-CM | POA: Diagnosis not present

## 2019-06-09 DIAGNOSIS — H3500 Unspecified background retinopathy: Secondary | ICD-10-CM | POA: Diagnosis not present

## 2019-06-09 DIAGNOSIS — H468 Other optic neuritis: Secondary | ICD-10-CM | POA: Diagnosis not present

## 2019-06-09 DIAGNOSIS — H469 Unspecified optic neuritis: Secondary | ICD-10-CM | POA: Diagnosis not present

## 2019-06-09 DIAGNOSIS — H53453 Other localized visual field defect, bilateral: Secondary | ICD-10-CM | POA: Diagnosis not present

## 2019-06-21 DIAGNOSIS — I4891 Unspecified atrial fibrillation: Secondary | ICD-10-CM | POA: Diagnosis not present

## 2019-06-21 DIAGNOSIS — Z79899 Other long term (current) drug therapy: Secondary | ICD-10-CM | POA: Diagnosis not present

## 2019-06-21 DIAGNOSIS — G4733 Obstructive sleep apnea (adult) (pediatric): Secondary | ICD-10-CM | POA: Diagnosis not present

## 2019-06-21 DIAGNOSIS — Z148 Genetic carrier of other disease: Secondary | ICD-10-CM | POA: Diagnosis not present

## 2019-06-21 DIAGNOSIS — E669 Obesity, unspecified: Secondary | ICD-10-CM | POA: Diagnosis not present

## 2019-06-21 DIAGNOSIS — R0602 Shortness of breath: Secondary | ICD-10-CM | POA: Diagnosis not present

## 2019-06-29 ENCOUNTER — Ambulatory Visit: Payer: Self-pay | Admitting: Surgery

## 2019-06-29 NOTE — H&P (View-Only) (Signed)
General Surgery [facility]  Teresa Caldwell DOB: 03/26/1941 Married / Language: Cleophus Molt / Race: White Female  History of Present Illness  The patient is a 78 year old female who presents with primary hyperparathyroidism.  CHIEF COMPLAINT:  primary hyperparathyroidism  Patient is referred by Dr. Philemon Kingdom for surgical evaluation and management of suspected primary hyperparathyroidism. Patient has had long-standing hypercalcemia. She has had markedly elevated intact PTH levels on occasion. She has had a normal 24-hour urine collection for calcium. Patient does have complications including osteoporosis, history of nephrolithiasis, and chronic fatigue. She also has chronic kidney disease. Patient lives in [place]. However, her daughter lives in [place], [place]. Her endocrinologist in [place] has retired. Patient has transferred her care here to [facility]. Patient is referred today by endocrinology for surgical evaluation for possible parathyroidectomy. There is a family history of thyroid disease in multiple family members including thyroid cancer. There is no family history of parathyroid disease or other endocrine neoplasms. Patient has had no prior head or neck surgery. She presents today accompanied by her husband.  Past Surgical History Cataract Surgery  Bilateral. Hysterectomy (not due to cancer) - Complete  Tonsillectomy   Diagnostic Studies History Mammogram  within last year Pap Smear  1-5 years ago  Allergies Plaquenil *ANTIMALARIALS*  Sulfa Antibiotics  Digoxin *CARDIOTONICS*  Clindamycin  Ciprofloxacin *CHEMICALS*  Bactrim *ANTI-INFECTIVE AGENTS - MISC.*  Allergies Reconciled   Medication History Alendronate Sodium (35MG  Tablet, Oral) Active. Pacerone (100MG  Tablet, Oral) Active. amLODIPine Besylate (10MG  Tablet, Oral) Active. Eliquis (5MG  Tablet, Oral) Active. Vitamin D  (2000UNIT Tablet, Oral) Active. clonazePAM (1MG  Tablet Disint, Oral) Active. Iron (Oral) Specific strength unknown - Active. Lisinopril (40MG  Tablet, Oral) Active. Potassium Chloride ER (10MEQ Tablet ER, Oral) Active. Vitamin B12 (1000MCG Tablet ER, Oral) Active. Levothyroxine Sodium (50MCG Tablet, Oral) Active. traZODone HCl (50MG  Tablet, Oral) Active. Ferrex 150 (150MG  Capsule, Oral) Active. Medications Reconciled  Pregnancy / Birth History Age at menarche  89 years. Age of menopause  41-55 Gravida  5 Maternal age  47-20 Para  5  Other Problems Arthritis  Atrial Fibrillation  Back Pain  High blood pressure  Kidney Stone  Sleep Apnea  Thyroid Disease   Review of Systems  General Present- Fatigue. Not Present- Appetite Loss, Chills, Fever, Night Sweats, Weight Gain and Weight Loss. Skin Present- Dryness. Not Present- Change in Wart/Mole, Hives, Jaundice, New Lesions, Non-Healing Wounds, Rash and Ulcer. HEENT Present- Hearing Loss and Wears glasses/contact lenses. Not Present- Earache, Hoarseness, Nose Bleed, Oral Ulcers, Ringing in the Ears, Seasonal Allergies, Sinus Pain, Sore Throat, Visual Disturbances and Yellow Eyes. Respiratory Not Present- Bloody sputum, Chronic Cough, Difficulty Breathing, Snoring and Wheezing. Breast Not Present- Breast Mass, Breast Pain, Nipple Discharge and Skin Changes. Cardiovascular Present- Swelling of Extremities. Not Present- Chest Pain, Difficulty Breathing Lying Down, Leg Cramps, Palpitations, Rapid Heart Rate and Shortness of Breath. Gastrointestinal Not Present- Abdominal Pain, Bloating, Bloody Stool, Change in Bowel Habits, Chronic diarrhea, Constipation, Difficulty Swallowing, Excessive gas, Gets full quickly at meals, Hemorrhoids, Indigestion, Nausea, Rectal Pain and Vomiting. Female Genitourinary Present- Frequency and Nocturia. Not Present- Painful Urination, Pelvic Pain and Urgency. Musculoskeletal Present- Back  Pain, Joint Pain and Muscle Weakness. Not Present- Joint Stiffness, Muscle Pain and Swelling of Extremities. Neurological Not Present- Decreased Memory, Fainting, Headaches, Numbness, Seizures, Tingling, Tremor, Trouble walking and Weakness. Psychiatric Not Present- Anxiety, Bipolar, Change in Sleep Pattern, Depression, Fearful and Frequent crying. Endocrine Present- Cold Intolerance. Not Present- Excessive Hunger,  Hair Changes, Heat Intolerance, Hot flashes and New Diabetes. Hematology Present- Blood Thinners and Easy Bruising. Not Present- Excessive bleeding, Gland problems, HIV and Persistent Infections.  Vitals Weight: 177.6 lb Height: 64in Body Surface Area: 1.86 m Body Mass Index: 30.48 kg/m  Temp.: 96.48F(Temporal)  Pulse: 91 (Regular)  P.OX: 96% (Room air) BP: 132/70 (Sitting, Left Arm, Standard)    Physical Exam  See vital signs recorded above  GENERAL APPEARANCE Development: normal Nutritional status: normal Gross deformities: none  SKIN Rash, lesions, ulcers: none Induration, erythema: none Nodules: none palpable  EYES Conjunctiva and lids: normal Pupils: equal and reactive Iris: normal bilaterally  EARS, NOSE, MOUTH, THROAT External ears: no lesion or deformity External nose: no lesion or deformity Hearing: grossly normal Lips: no lesion or deformity Dentition: normal for age Oral mucosa: moist  NECK Symmetric: yes Trachea: midline Thyroid: no palpable nodules in the thyroid bed  CHEST Respiratory effort: normal Retraction or accessory muscle use: no Breath sounds: normal bilaterally Rales, rhonchi, wheeze: none  CARDIOVASCULAR Auscultation: regular rhythm, normal rate Murmurs: none Pulses: carotid and radial pulse 2+ palpable Lower extremity edema: bilateral edema at ankles Lower extremity varicosities: none  MUSCULOSKELETAL Station and gait: normal Digits and nails: no clubbing or cyanosis Muscle strength: grossly  normal all extremities Range of motion: grossly normal all extremities Deformity: none  LYMPHATIC Cervical: none palpable Supraclavicular: none palpable  PSYCHIATRIC Oriented to person, place, and time: yes Mood and affect: normal for situation Judgment and insight: appropriate for situation    Assessment & Plan  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 She is accompanied by her husband. They are provided with written literature on parathyroid disease to review at home.  Patient has long-standing biochemical evidence of primary hyperparathyroidism. She has not had any imaging studies. I would like to obtain an ultrasound examination of the neck to evaluate for an enlarged parathyroid gland and also to evaluate the thyroid. We will also obtain a nuclear medicine parathyroid scan with sestamibi in hopes of identifying the parathyroid adenoma. We will make arrangements for the studies in the near future.  We discussed minimally invasive parathyroid surgery versus the traditional neck exploration. We also discussed the fact that she would require cardiac clearance prior to surgery even her history of atrial fibrillation and the fact that she is on anticoagulation.  Patient will be scheduled for the above studies. We will contact her with the results. If they localize an adenoma, then we will move forward with planning for surgery. If the studies do not localize a parathyroid adenoma, then we will obtain a 4D CT scan with parathyroid protocol in hopes of identifying the adenoma.  Patient and her husband understand this plan and agreed to proceed.  Pt Education - Pamphlet Given - The Parathyroid Surgery Book: discussed with patient and provided information.   ADDENDUM  Telephone  call to patient and her son with MRI results. Localized a right inferior adenoma.  Proceed with scheduling of surgery. Plan [place]. Will need cardiac clearance given Afib and anticoagulation. Will need to stop anticoagulant 5 days prior to OR.  The risks and benefits of the procedure have been discussed at length with the patient.  The patient understands the proposed procedure, potential alternative treatments, and the course of recovery to be expected.  All  of the patient's questions have been answered at this time.  The patient wishes to proceed with surgery.   Armandina Gemma, [place] [facility] Office: [phone]

## 2019-06-29 NOTE — H&P (Signed)
General Surgery [facility]  Teresa Caldwell DOB: [birth year] Married / Language: Cleophus Molt / Race: White Female  History of Present Illness  The patient is a 78 year old female who presents with primary hyperparathyroidism.  CHIEF COMPLAINT:  primary hyperparathyroidism  Patient is referred by Dr. Philemon Kingdom for surgical evaluation and management of suspected primary hyperparathyroidism. Patient has had long-standing hypercalcemia. She has had markedly elevated intact PTH levels on occasion. She has had a normal 24-hour urine collection for calcium. Patient does have complications including osteoporosis, history of nephrolithiasis, and chronic fatigue. She also has chronic kidney disease. Patient lives in [place]. However, her daughter lives in [place], [place]. Her endocrinologist in [place] has retired. Patient has transferred her care here to [facility]. Patient is referred today by endocrinology for surgical evaluation for possible parathyroidectomy. There is a family history of thyroid disease in multiple family members including thyroid cancer. There is no family history of parathyroid disease or other endocrine neoplasms. Patient has had no prior head or neck surgery. She presents today accompanied by her husband.  Past Surgical History Cataract Surgery  Bilateral. Hysterectomy (not due to cancer) - Complete  Tonsillectomy   Diagnostic Studies History Mammogram  within last year Pap Smear  1-5 years ago  Allergies Plaquenil *ANTIMALARIALS*  Sulfa Antibiotics  Digoxin *CARDIOTONICS*  Clindamycin  Ciprofloxacin *CHEMICALS*  Bactrim *ANTI-INFECTIVE AGENTS - MISC.*  Allergies Reconciled   Medication History Alendronate Sodium (35MG  Tablet, Oral) Active. Pacerone (100MG  Tablet, Oral) Active. amLODIPine Besylate (10MG  Tablet, Oral) Active. Eliquis (5MG  Tablet, Oral) Active. Vitamin D  (2000UNIT Tablet, Oral) Active. clonazePAM (1MG  Tablet Disint, Oral) Active. Iron (Oral) Specific strength unknown - Active. Lisinopril (40MG  Tablet, Oral) Active. Potassium Chloride ER (10MEQ Tablet ER, Oral) Active. Vitamin B12 (1000MCG Tablet ER, Oral) Active. Levothyroxine Sodium (50MCG Tablet, Oral) Active. traZODone HCl (50MG  Tablet, Oral) Active. Ferrex 150 (150MG  Capsule, Oral) Active. Medications Reconciled  Pregnancy / Birth History Age at menarche  62 years. Age of menopause  65-55 Gravida  5 Maternal age  60-20 Para  5  Other Problems Arthritis  Atrial Fibrillation  Back Pain  High blood pressure  Kidney Stone  Sleep Apnea  Thyroid Disease   Review of Systems  General Present- Fatigue. Not Present- Appetite Loss, Chills, Fever, Night Sweats, Weight Gain and Weight Loss. Skin Present- Dryness. Not Present- Change in Wart/Mole, Hives, Jaundice, New Lesions, Non-Healing Wounds, Rash and Ulcer. HEENT Present- Hearing Loss and Wears glasses/contact lenses. Not Present- Earache, Hoarseness, Nose Bleed, Oral Ulcers, Ringing in the Ears, Seasonal Allergies, Sinus Pain, Sore Throat, Visual Disturbances and Yellow Eyes. Respiratory Not Present- Bloody sputum, Chronic Cough, Difficulty Breathing, Snoring and Wheezing. Breast Not Present- Breast Mass, Breast Pain, Nipple Discharge and Skin Changes. Cardiovascular Present- Swelling of Extremities. Not Present- Chest Pain, Difficulty Breathing Lying Down, Leg Cramps, Palpitations, Rapid Heart Rate and Shortness of Breath. Gastrointestinal Not Present- Abdominal Pain, Bloating, Bloody Stool, Change in Bowel Habits, Chronic diarrhea, Constipation, Difficulty Swallowing, Excessive gas, Gets full quickly at meals, Hemorrhoids, Indigestion, Nausea, Rectal Pain and Vomiting. Female Genitourinary Present- Frequency and Nocturia. Not Present- Painful Urination, Pelvic Pain and Urgency. Musculoskeletal Present- Back  Pain, Joint Pain and Muscle Weakness. Not Present- Joint Stiffness, Muscle Pain and Swelling of Extremities. Neurological Not Present- Decreased Memory, Fainting, Headaches, Numbness, Seizures, Tingling, Tremor, Trouble walking and Weakness. Psychiatric Not Present- Anxiety, Bipolar, Change in Sleep Pattern, Depression, Fearful and Frequent crying. Endocrine Present- Cold Intolerance. Not Present- Excessive Hunger,  Hair Changes, Heat Intolerance, Hot flashes and New Diabetes. Hematology Present- Blood Thinners and Easy Bruising. Not Present- Excessive bleeding, Gland problems, HIV and Persistent Infections.  Vitals Weight: 177.6 lb Height: 64in Body Surface Area: 1.86 m Body Mass Index: 30.48 kg/m  Temp.: 96.31F(Temporal)  Pulse: 91 (Regular)  P.OX: 96% (Room air) BP: 132/70 (Sitting, Left Arm, Standard)    Physical Exam  See vital signs recorded above  GENERAL APPEARANCE Development: normal Nutritional status: normal Gross deformities: none  SKIN Rash, lesions, ulcers: none Induration, erythema: none Nodules: none palpable  EYES Conjunctiva and lids: normal Pupils: equal and reactive Iris: normal bilaterally  EARS, NOSE, MOUTH, THROAT External ears: no lesion or deformity External nose: no lesion or deformity Hearing: grossly normal Lips: no lesion or deformity Dentition: normal for age Oral mucosa: moist  NECK Symmetric: yes Trachea: midline Thyroid: no palpable nodules in the thyroid bed  CHEST Respiratory effort: normal Retraction or accessory muscle use: no Breath sounds: normal bilaterally Rales, rhonchi, wheeze: none  CARDIOVASCULAR Auscultation: regular rhythm, normal rate Murmurs: none Pulses: carotid and radial pulse 2+ palpable Lower extremity edema: bilateral edema at ankles Lower extremity varicosities: none  MUSCULOSKELETAL Station and gait: normal Digits and nails: no clubbing or cyanosis Muscle strength: grossly  normal all extremities Range of motion: grossly normal all extremities Deformity: none  LYMPHATIC Cervical: none palpable Supraclavicular: none palpable  PSYCHIATRIC Oriented to person, place, and time: yes Mood and affect: normal for situation Judgment and insight: appropriate for situation    Assessment & Plan  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 She is accompanied by her husband. They are provided with written literature on parathyroid disease to review at home.  Patient has long-standing biochemical evidence of primary hyperparathyroidism. She has not had any imaging studies. I would like to obtain an ultrasound examination of the neck to evaluate for an enlarged parathyroid gland and also to evaluate the thyroid. We will also obtain a nuclear medicine parathyroid scan with sestamibi in hopes of identifying the parathyroid adenoma. We will make arrangements for the studies in the near future.  We discussed minimally invasive parathyroid surgery versus the traditional neck exploration. We also discussed the fact that she would require cardiac clearance prior to surgery even her history of atrial fibrillation and the fact that she is on anticoagulation.  Patient will be scheduled for the above studies. We will contact her with the results. If they localize an adenoma, then we will move forward with planning for surgery. If the studies do not localize a parathyroid adenoma, then we will obtain a 4D CT scan with parathyroid protocol in hopes of identifying the adenoma.  Patient and her husband understand this plan and agreed to proceed.  Pt Education - Pamphlet Given - The Parathyroid Surgery Book: discussed with patient and provided information.   ADDENDUM  Telephone  call to patient and her son with MRI results. Localized a right inferior adenoma.  Proceed with scheduling of surgery. Plan [place]. Will need cardiac clearance given Afib and anticoagulation. Will need to stop anticoagulant 5 days prior to OR.  The risks and benefits of the procedure have been discussed at length with the patient.  The patient understands the proposed procedure, potential alternative treatments, and the course of recovery to be expected.  All  of the patient's questions have been answered at this time.  The patient wishes to proceed with surgery.   Armandina Gemma, [place] [facility] Office: [phone]

## 2019-07-13 NOTE — Patient Instructions (Addendum)
YOU NEED TO HAVE A COVID 19 TEST ON 07-14-2019. ONCE YOUR COVID TEST IS COMPLETED, PLEASE BEGIN THE QUARANTINE INSTRUCTIONS AS OUTLINED IN YOUR HANDOUT.                Vickee Mormino    Your procedure is scheduled on: 07-18-2019   Report to [facility]  Entrance    Report to Admitting at 7:30  AM    1 VISITOR IS ALLOWED TO WAIT IN WAITING ROOM  ONLY DAY OF YOUR SURGERY.    Call this number if you have problems the morning of surgery [phone]    Remember: Do not eat food or drink liquids :After Midnight.      Take these medicines the morning of surgery with A SIP OF WATER: Amiodarone (Pacerone), Levothyroxine (Synthroid)   BRUSH YOUR TEETH MORNING OF SURGERY AND RINSE YOUR MOUTH OUT, NO CHEWING GUM CANDY OR MINTS.              You may not have any metal on your body including hair pins and              piercings     Do not wear jewelry, make-up, lotions, powders or perfumes, deodorant             Do not wear nail polish.  Do not shave 48 hours prior to surgery.   Do not bring valuables to the hospital. [place].  Contacts, dentures or bridgework may not be worn into surgery.  Leave suitcase in the car. After surgery it may be brought to your room.   BRING CPAP MASK AND TUBING     _____________________________________________________________________             [facility] - Preparing for Surgery Before surgery, you can play an important role.  Because skin is not sterile, your skin needs to be as free of germs as possible.  You can reduce the number of germs on your skin by washing with CHG (chlorahexidine gluconate) soap before surgery.  CHG is an antiseptic cleaner which kills germs and bonds with the skin to continue killing germs even after washing. Please DO NOT use if you have an allergy to CHG or antibacterial soaps.  If your skin becomes reddened/irritated stop using the CHG and inform your nurse  when you arrive at Short Stay. Do not shave (including legs and underarms) for at least 48 hours prior to the first CHG shower.  You may shave your face/neck. Please follow these instructions carefully:  1.  Shower with CHG Soap the night before surgery and the  morning of Surgery.  2.  If you choose to wash your hair, wash your hair first as usual with your  normal  shampoo.  3.  After you shampoo, rinse your hair and body thoroughly to remove the  shampoo.                           4.  Use CHG as you would any other liquid soap.  You can apply chg directly  to the skin and wash                       Gently with a scrungie or clean washcloth.  5.  Apply the CHG Soap to your body ONLY FROM  THE NECK DOWN.   Do not use on face/ open                           Wound or open sores. Avoid contact with eyes, ears mouth and genitals (private parts).                       Wash face,  Genitals (private parts) with your normal soap.             6.  Wash thoroughly, paying special attention to the area where your surgery  will be performed.  7.  Thoroughly rinse your body with warm water from the neck down.  8.  DO NOT shower/wash with your normal soap after using and rinsing off  the CHG Soap.                9.  Pat yourself dry with a clean towel.            10.  Wear clean pajamas.            11.  Place clean sheets on your bed the night of your first shower and do not  sleep with pets. Day of Surgery : Do not apply any lotions/deodorants the morning of surgery.  Please wear clean clothes to the hospital/surgery center.  FAILURE TO FOLLOW THESE INSTRUCTIONS MAY RESULT IN THE CANCELLATION OF YOUR SURGERY PATIENT SIGNATURE_________________________________  NURSE SIGNATURE__________________________________  ________________________________________________________________________

## 2019-07-14 ENCOUNTER — Encounter (HOSPITAL_COMMUNITY)
Admission: RE | Admit: 2019-07-14 | Discharge: 2019-07-14 | Disposition: A | Discharge status: Home / Self Care | Payer: Medicare Other | Source: Ambulatory Visit | Attending: Surgery | Admitting: Surgery

## 2019-07-14 ENCOUNTER — Other Ambulatory Visit: Payer: Self-pay

## 2019-07-14 ENCOUNTER — Other Ambulatory Visit (HOSPITAL_COMMUNITY)
Admission: RE | Admit: 2019-07-14 | Discharge: 2019-07-14 | Disposition: A | Discharge status: Home / Self Care | Payer: Medicare Other | Source: Ambulatory Visit | Attending: Surgery | Admitting: Surgery

## 2019-07-14 ENCOUNTER — Encounter (HOSPITAL_COMMUNITY): Payer: Self-pay

## 2019-07-14 DIAGNOSIS — Z7901 Long term (current) use of anticoagulants: Secondary | ICD-10-CM | POA: Diagnosis not present

## 2019-07-14 DIAGNOSIS — Z79890 Hormone replacement therapy (postmenopausal): Secondary | ICD-10-CM | POA: Insufficient documentation

## 2019-07-14 DIAGNOSIS — Z20828 Contact with and (suspected) exposure to other viral communicable diseases: Secondary | ICD-10-CM | POA: Diagnosis not present

## 2019-07-14 DIAGNOSIS — E079 Disorder of thyroid, unspecified: Secondary | ICD-10-CM | POA: Diagnosis not present

## 2019-07-14 DIAGNOSIS — Z79899 Other long term (current) drug therapy: Secondary | ICD-10-CM | POA: Diagnosis not present

## 2019-07-14 DIAGNOSIS — Z01818 Encounter for other preprocedural examination: Secondary | ICD-10-CM | POA: Insufficient documentation

## 2019-07-14 DIAGNOSIS — I10 Essential (primary) hypertension: Secondary | ICD-10-CM | POA: Insufficient documentation

## 2019-07-14 DIAGNOSIS — M797 Fibromyalgia: Secondary | ICD-10-CM | POA: Diagnosis not present

## 2019-07-14 DIAGNOSIS — I482 Chronic atrial fibrillation, unspecified: Secondary | ICD-10-CM | POA: Insufficient documentation

## 2019-07-14 DIAGNOSIS — E210 Primary hyperparathyroidism: Secondary | ICD-10-CM | POA: Insufficient documentation

## 2019-07-14 DIAGNOSIS — N289 Disorder of kidney and ureter, unspecified: Secondary | ICD-10-CM | POA: Diagnosis not present

## 2019-07-14 DIAGNOSIS — Z7983 Long term (current) use of bisphosphonates: Secondary | ICD-10-CM | POA: Diagnosis not present

## 2019-07-14 LAB — CBC
HCT: 41.3 % (ref 36.0–46.0)
Hemoglobin: 12.9 g/dL (ref 12.0–15.0)
MCH: 29.6 pg (ref 26.0–34.0)
MCHC: 31.2 g/dL (ref 30.0–36.0)
MCV: 94.7 fL (ref 80.0–100.0)
Platelets: 285 10*3/uL (ref 150–400)
RBC: 4.36 MIL/uL (ref 3.87–5.11)
RDW: 16.1 % — ABNORMAL HIGH (ref 11.5–15.5)
WBC: 10.8 10*3/uL — ABNORMAL HIGH (ref 4.0–10.5)
nRBC: 0 % (ref 0.0–0.2)

## 2019-07-14 LAB — BASIC METABOLIC PANEL
Anion gap: 9 (ref 5–15)
BUN: 35 mg/dL — ABNORMAL HIGH (ref 8–23)
CO2: 24 mmol/L (ref 22–32)
Calcium: 9.9 mg/dL (ref 8.9–10.3)
Chloride: 107 mmol/L (ref 98–111)
Creatinine, Ser: 1.89 mg/dL — ABNORMAL HIGH (ref 0.44–1.00)
GFR calc Af Amer: 29 mL/min — ABNORMAL LOW (ref >60)
GFR calc non Af Amer: 25 mL/min — ABNORMAL LOW (ref >60)
Glucose, Bld: 117 mg/dL — ABNORMAL HIGH (ref 70–99)
Potassium: 5.1 mmol/L (ref 3.5–5.1)
Sodium: 140 mmol/L (ref 135–145)

## 2019-07-14 LAB — SARS CORONAVIRUS 2 (TAT 6-24 HRS): SARS Coronavirus 2: NEGATIVE

## 2019-07-14 NOTE — Progress Notes (Signed)
01-24-19 Cardiac Clearance from Dr Delia Chimes, along with EKG, and Stress Test on chart.

## 2019-07-15 NOTE — Anesthesia Preprocedure Evaluation (Addendum)
Anesthesia Evaluation  Patient identified by MRN, date of birth, ID band Patient awake    Reviewed: Allergy & Precautions, NPO status , Patient's Chart, lab work & pertinent test results  Airway Mallampati: I       Dental no notable dental hx. (+) Teeth Intact   Pulmonary sleep apnea ,    Pulmonary exam normal breath sounds clear to auscultation       Cardiovascular hypertension, Pt. on medications + dysrhythmias Atrial Fibrillation  Rhythm:Irregular Rate:Normal     Neuro/Psych negative psych ROS   GI/Hepatic negative GI ROS, Neg liver ROS,   Endo/Other  Hypothyroidism   Renal/GU Renal InsufficiencyRenal disease     Musculoskeletal   Abdominal Normal abdominal exam  (+)   Peds  Hematology   Anesthesia Other Findings   Reproductive/Obstetrics                            Anesthesia Physical Anesthesia Plan  ASA: II  Anesthesia Plan: General   Post-op Pain Management:    Induction: Intravenous  PONV Risk Score and Plan: 3 and Ondansetron, Dexamethasone and Treatment may vary due to age or medical condition  Airway Management Planned: Oral ETT  Additional Equipment: None  Intra-op Plan:   Post-operative Plan: Extubation in OR  Informed Consent: I have reviewed the patients History and Physical, chart, labs and discussed the procedure including the risks, benefits and alternatives for the proposed anesthesia with the patient or authorized representative who has indicated his/her understanding and acceptance.     Dental advisory given  Plan Discussed with: CRNA  Anesthesia Plan Comments: (See PAT note 07/14/2019, Konrad Felix, PA-C)       Anesthesia Quick Evaluation

## 2019-07-15 NOTE — Progress Notes (Addendum)
Anesthesia Chart Review   Case: [id number] Date/Time: 07/18/19 0915   Procedure: RIGHT INFERIOR PARATHYROIDECTOMY (Right )   Anesthesia type: General   Pre-op diagnosis: PRIMARY HYPERPARATHYROIDISM   Location: [facility] ROOM 01 / [facility]   Surgeon: Armandina Gemma, MD      DISCUSSION:78 y.o. never smoker with h/o A-fib (on Eliquis), HTN, renal insufficiency, primary hyperparathyroidism scheduled for above procedure 07/18/2019 with Dr. Armandina Gemma.   Pt seen by cardiologist, Dr. Vella Redhead, for preoperative evaluation 01/25/2019. Stress test ordered as part of evaluation, no evidence of ischemia.  Pt advised to hold Eliquis 5 days prior to procedure.  Pt reports last dose 07/12/2019.   Anticipate pt can proceed with planned procedure barring acute status change.   VS: BP (!) 143/79   Pulse 79   Temp 36.7 C (Oral)   Resp 18   Ht 5\' 3"  (1.6 m)   Wt 79.9 kg   LMP  (LMP Unknown)   SpO2 98%   BMI 31.21 kg/m   PROVIDERS: Darleen Crocker, DO is PCP   Cast, Gwyndolyn Saxon, DO is Cardiologist  LABS: Labs reviewed: Acceptable for surgery. (all labs ordered are listed, but only abnormal results are displayed)  Labs Reviewed  BASIC METABOLIC PANEL - Abnormal; Notable for the following components:      Result Value   Glucose, Bld 117 (*)    BUN 35 (*)    Creatinine, Ser 1.89 (*)    GFR calc non Af Amer 25 (*)    GFR calc Af Amer 29 (*)    All other components within normal limits  CBC - Abnormal; Notable for the following components:   WBC 10.8 (*)    RDW 16.1 (*)    All other components within normal limits     IMAGES:   EKG: 01/19/2019 Rate 78 bpm Sinus rhythm  Abnormal R-wave progression, early transition  Left ventricular hypertrophy  CV: Stress Test 01/24/2019 Conclusion: 1. No evidence of ischemia or previous infarction was detected on this study.  2. The EKG portion of the study is negative for ischemia 3. The patient had no chest symptoms 4. The left ventricular chamber size and  contractility are normal with an ejection fraction of 74% 5. Right ventricular hypertrophy and mild enlargement with normal contractility Past Medical History:  Diagnosis Date  . Arthritis   . Atrial fibrillation, chronic   . Fibromyalgia   . Hypertension   . Thyroid disease     Past Surgical History:  Procedure Laterality Date  . TONSILLECTOMY    . WRIST ARTHROSCOPY Left     MEDICATIONS: . alendronate (FOSAMAX) 35 MG tablet  . amiodarone (PACERONE) 100 MG tablet  . amLODipine (NORVASC) 10 MG tablet  . apixaban (ELIQUIS) 5 MG TABS tablet  . Cholecalciferol (VITAMIN D-3 PO)  . cloNIDine (CATAPRES) 0.1 MG tablet  . iron polysaccharides (FERREX 150) 150 MG capsule  . levothyroxine (SYNTHROID, LEVOTHROID) 75 MCG tablet  . lisinopril (PRINIVIL,ZESTRIL) 40 MG tablet  . metoCLOPramide (REGLAN) 10 MG tablet  . traZODone (DESYREL) 50 MG tablet  . vitamin B-12 (CYANOCOBALAMIN) 1000 MCG tablet   No current facility-administered medications for this encounter.      Maia Plan [facility] Pre-Surgical Testing [phone] 07/15/19  11:19 AM

## 2019-07-15 NOTE — Progress Notes (Signed)
BMP result routed to Dr. Gala Lewandowsky office for review

## 2019-07-17 ENCOUNTER — Encounter (HOSPITAL_COMMUNITY): Payer: Self-pay | Admitting: Surgery

## 2019-07-18 ENCOUNTER — Other Ambulatory Visit: Payer: Self-pay

## 2019-07-18 ENCOUNTER — Observation Stay (HOSPITAL_COMMUNITY)
Admission: RE | Admit: 2019-07-18 | Discharge: 2019-07-19 | Disposition: A | Discharge status: Home / Self Care | Payer: Medicare Other | Attending: Surgery | Admitting: Surgery

## 2019-07-18 ENCOUNTER — Encounter (HOSPITAL_COMMUNITY)
Admission: RE | Disposition: A | Discharge status: Home / Self Care | Payer: Self-pay | Source: Home / Self Care | Attending: Surgery

## 2019-07-18 ENCOUNTER — Ambulatory Visit (HOSPITAL_COMMUNITY): Payer: Medicare Other | Admitting: Physician Assistant

## 2019-07-18 ENCOUNTER — Encounter (HOSPITAL_COMMUNITY): Payer: Self-pay | Admitting: Emergency Medicine

## 2019-07-18 ENCOUNTER — Ambulatory Visit (HOSPITAL_COMMUNITY): Payer: Medicare Other | Admitting: Anesthesiology

## 2019-07-18 DIAGNOSIS — Z79899 Other long term (current) drug therapy: Secondary | ICD-10-CM | POA: Insufficient documentation

## 2019-07-18 DIAGNOSIS — M1990 Unspecified osteoarthritis, unspecified site: Secondary | ICD-10-CM | POA: Diagnosis not present

## 2019-07-18 DIAGNOSIS — D351 Benign neoplasm of parathyroid gland: Secondary | ICD-10-CM | POA: Diagnosis not present

## 2019-07-18 DIAGNOSIS — G4730 Sleep apnea, unspecified: Secondary | ICD-10-CM | POA: Diagnosis not present

## 2019-07-18 DIAGNOSIS — Z881 Allergy status to other antibiotic agents status: Secondary | ICD-10-CM | POA: Insufficient documentation

## 2019-07-18 DIAGNOSIS — Z7901 Long term (current) use of anticoagulants: Secondary | ICD-10-CM | POA: Insufficient documentation

## 2019-07-18 DIAGNOSIS — Z79890 Hormone replacement therapy (postmenopausal): Secondary | ICD-10-CM | POA: Insufficient documentation

## 2019-07-18 DIAGNOSIS — I10 Essential (primary) hypertension: Secondary | ICD-10-CM | POA: Diagnosis not present

## 2019-07-18 DIAGNOSIS — E210 Primary hyperparathyroidism: Principal | ICD-10-CM | POA: Diagnosis present

## 2019-07-18 DIAGNOSIS — I4891 Unspecified atrial fibrillation: Secondary | ICD-10-CM | POA: Diagnosis not present

## 2019-07-18 DIAGNOSIS — Z7983 Long term (current) use of bisphosphonates: Secondary | ICD-10-CM | POA: Diagnosis not present

## 2019-07-18 DIAGNOSIS — I129 Hypertensive chronic kidney disease with stage 1 through stage 4 chronic kidney disease, or unspecified chronic kidney disease: Secondary | ICD-10-CM | POA: Insufficient documentation

## 2019-07-18 DIAGNOSIS — E039 Hypothyroidism, unspecified: Secondary | ICD-10-CM | POA: Diagnosis not present

## 2019-07-18 DIAGNOSIS — N189 Chronic kidney disease, unspecified: Secondary | ICD-10-CM | POA: Diagnosis not present

## 2019-07-18 DIAGNOSIS — Z882 Allergy status to sulfonamides status: Secondary | ICD-10-CM | POA: Diagnosis not present

## 2019-07-18 DIAGNOSIS — Z888 Allergy status to other drugs, medicaments and biological substances status: Secondary | ICD-10-CM | POA: Insufficient documentation

## 2019-07-18 DIAGNOSIS — E785 Hyperlipidemia, unspecified: Secondary | ICD-10-CM | POA: Diagnosis not present

## 2019-07-18 HISTORY — PX: PARATHYROIDECTOMY: SHX19

## 2019-07-18 SURGERY — PARATHYROIDECTOMY
Anesthesia: General | Site: Neck | Laterality: Right

## 2019-07-18 MED ORDER — ROCURONIUM BROMIDE 10 MG/ML (PF) SYRINGE
PREFILLED_SYRINGE | INTRAVENOUS | Status: DC | PRN
  Administered 2019-07-18: 20 mg via INTRAVENOUS
  Administered 2019-07-18: 10 mg via INTRAVENOUS

## 2019-07-18 MED ORDER — 0.9 % SODIUM CHLORIDE (POUR BTL) OPTIME
TOPICAL | Status: DC | PRN
  Administered 2019-07-18: 1000 mL

## 2019-07-18 MED ORDER — ACETAMINOPHEN 10 MG/ML IV SOLN: 1000.0000 mg | Freq: Once | INTRAVENOUS | Status: DC | PRN

## 2019-07-18 MED ORDER — HYDROCODONE-ACETAMINOPHEN 5-325 MG PO TABS
1.0000 | ORAL_TABLET | ORAL | Status: DC | PRN
  Filled 2019-07-18: qty 2

## 2019-07-18 MED ORDER — LEVOTHYROXINE SODIUM 75 MCG PO TABS
75.0000 ug | ORAL_TABLET | Freq: Every day | ORAL | Status: DC
  Administered 2019-07-18 – 2019-07-19 (×2): 75 ug via ORAL
  Filled 2019-07-18 (×2): qty 1

## 2019-07-18 MED ORDER — ONDANSETRON HCL 4 MG/2ML IJ SOLN
INTRAMUSCULAR | Status: AC
  Filled 2019-07-18: qty 2

## 2019-07-18 MED ORDER — AMIODARONE HCL 100 MG PO TABS
100.0000 mg | ORAL_TABLET | Freq: Every morning | ORAL | Status: DC
  Administered 2019-07-19: 09:00:00 100 mg via ORAL
  Filled 2019-07-18: qty 1

## 2019-07-18 MED ORDER — ONDANSETRON 4 MG PO TBDP: 4.0000 mg | ORAL_TABLET | Freq: Four times a day (QID) | ORAL | Status: DC | PRN

## 2019-07-18 MED ORDER — DEXAMETHASONE SODIUM PHOSPHATE 10 MG/ML IJ SOLN
INTRAMUSCULAR | Status: DC | PRN
  Administered 2019-07-18: 10 mg via INTRAVENOUS

## 2019-07-18 MED ORDER — FENTANYL CITRATE (PF) 100 MCG/2ML IJ SOLN
INTRAMUSCULAR | Status: DC | PRN
  Administered 2019-07-18: 100 ug via INTRAVENOUS

## 2019-07-18 MED ORDER — LISINOPRIL 20 MG PO TABS
40.0000 mg | ORAL_TABLET | Freq: Every morning | ORAL | Status: DC
  Administered 2019-07-19: 09:00:00 40 mg via ORAL
  Filled 2019-07-18: qty 2

## 2019-07-18 MED ORDER — ACETAMINOPHEN 650 MG RE SUPP: 650.0000 mg | Freq: Four times a day (QID) | RECTAL | Status: DC | PRN

## 2019-07-18 MED ORDER — FENTANYL CITRATE (PF) 100 MCG/2ML IJ SOLN
INTRAMUSCULAR | Status: AC
  Filled 2019-07-18: qty 2

## 2019-07-18 MED ORDER — BUPIVACAINE HCL 0.25 % IJ SOLN
INTRAMUSCULAR | Status: AC
  Filled 2019-07-18: qty 1

## 2019-07-18 MED ORDER — ACETAMINOPHEN 325 MG PO TABS: 650.0000 mg | ORAL_TABLET | Freq: Four times a day (QID) | ORAL | Status: DC | PRN

## 2019-07-18 MED ORDER — SUCCINYLCHOLINE CHLORIDE 200 MG/10ML IV SOSY
PREFILLED_SYRINGE | INTRAVENOUS | Status: DC | PRN
  Administered 2019-07-18: 100 mg via INTRAVENOUS

## 2019-07-18 MED ORDER — TRAMADOL HCL 50 MG PO TABS
50.0000 mg | ORAL_TABLET | Freq: Four times a day (QID) | ORAL | Status: DC | PRN
  Administered 2019-07-18: 50 mg via ORAL
  Filled 2019-07-18: qty 1

## 2019-07-18 MED ORDER — HYDROMORPHONE HCL 1 MG/ML IJ SOLN
INTRAMUSCULAR | Status: AC
  Administered 2019-07-18: 14:00:00
  Filled 2019-07-18: qty 1

## 2019-07-18 MED ORDER — ONDANSETRON HCL 4 MG/2ML IJ SOLN
INTRAMUSCULAR | Status: DC | PRN
  Administered 2019-07-18: 4 mg via INTRAVENOUS

## 2019-07-18 MED ORDER — PROPOFOL 10 MG/ML IV BOLUS
INTRAVENOUS | Status: AC
  Filled 2019-07-18: qty 20

## 2019-07-18 MED ORDER — AMLODIPINE BESYLATE 10 MG PO TABS
10.0000 mg | ORAL_TABLET | Freq: Every evening | ORAL | Status: DC
  Administered 2019-07-18: 10 mg via ORAL
  Filled 2019-07-18: qty 1

## 2019-07-18 MED ORDER — LIDOCAINE 2% (20 MG/ML) 5 ML SYRINGE
INTRAMUSCULAR | Status: AC
  Filled 2019-07-18: qty 5

## 2019-07-18 MED ORDER — SUGAMMADEX SODIUM 200 MG/2ML IV SOLN
INTRAVENOUS | Status: DC | PRN
  Administered 2019-07-18: 200 mg via INTRAVENOUS

## 2019-07-18 MED ORDER — HYDROMORPHONE HCL 1 MG/ML IJ SOLN
0.2500 mg | INTRAMUSCULAR | Status: DC | PRN
  Administered 2019-07-18: 11:00:00 0.25 mg via INTRAVENOUS
  Administered 2019-07-18: 11:00:00 0.5 mg via INTRAVENOUS
  Administered 2019-07-18: 12:00:00 0.25 mg via INTRAVENOUS

## 2019-07-18 MED ORDER — DEXAMETHASONE SODIUM PHOSPHATE 10 MG/ML IJ SOLN
INTRAMUSCULAR | Status: AC
  Filled 2019-07-18: qty 1

## 2019-07-18 MED ORDER — CHLORHEXIDINE GLUCONATE CLOTH 2 % EX PADS
6.0000 | MEDICATED_PAD | Freq: Once | CUTANEOUS | Status: DC
  Administered 2019-07-18: 14:00:00 6 via TOPICAL

## 2019-07-18 MED ORDER — CLONIDINE HCL 0.1 MG PO TABS: 0.1000 mg | ORAL_TABLET | Freq: Two times a day (BID) | ORAL | Status: DC | PRN

## 2019-07-18 MED ORDER — BUPIVACAINE HCL 0.25 % IJ SOLN
INTRAMUSCULAR | Status: DC | PRN
  Administered 2019-07-18: 9 mL

## 2019-07-18 MED ORDER — ROCURONIUM BROMIDE 10 MG/ML (PF) SYRINGE
PREFILLED_SYRINGE | INTRAVENOUS | Status: AC
  Filled 2019-07-18: qty 10

## 2019-07-18 MED ORDER — KCL IN DEXTROSE-NACL 20-5-0.45 MEQ/L-%-% IV SOLN
INTRAVENOUS | Status: DC
  Administered 2019-07-18 – 2019-07-19 (×2): via INTRAVENOUS
  Filled 2019-07-18 (×2): qty 1000

## 2019-07-18 MED ORDER — MEPERIDINE HCL 50 MG/ML IJ SOLN: 6.2500 mg | INTRAMUSCULAR | Status: DC | PRN

## 2019-07-18 MED ORDER — TRAZODONE HCL 50 MG PO TABS: 25.0000 mg | ORAL_TABLET | Freq: Every evening | ORAL | Status: DC | PRN

## 2019-07-18 MED ORDER — LACTATED RINGERS IV SOLN: INTRAVENOUS | Status: DC

## 2019-07-18 MED ORDER — ONDANSETRON HCL 4 MG/2ML IJ SOLN: 4.0000 mg | Freq: Four times a day (QID) | INTRAMUSCULAR | Status: DC | PRN

## 2019-07-18 MED ORDER — PROPOFOL 10 MG/ML IV BOLUS
INTRAVENOUS | Status: DC | PRN
  Administered 2019-07-18: 120 mg via INTRAVENOUS

## 2019-07-18 MED ORDER — LIDOCAINE 2% (20 MG/ML) 5 ML SYRINGE
INTRAMUSCULAR | Status: DC | PRN
  Administered 2019-07-18: 25 mg via INTRAVENOUS

## 2019-07-18 MED ORDER — EPHEDRINE SULFATE-NACL 50-0.9 MG/10ML-% IV SOSY
PREFILLED_SYRINGE | INTRAVENOUS | Status: DC | PRN
  Administered 2019-07-18: 10 mg via INTRAVENOUS

## 2019-07-18 MED ORDER — SODIUM CHLORIDE 0.9 % IV SOLN
INTRAVENOUS | Status: DC
  Administered 2019-07-18: 08:00:00 via INTRAVENOUS

## 2019-07-18 MED ORDER — CEFAZOLIN SODIUM-DEXTROSE 2-4 GM/100ML-% IV SOLN
2.0000 g | INTRAVENOUS | Status: AC
  Administered 2019-07-18: 2 g via INTRAVENOUS
  Filled 2019-07-18: qty 100

## 2019-07-18 MED ORDER — HYDROMORPHONE HCL 1 MG/ML IJ SOLN: 1.0000 mg | INTRAMUSCULAR | Status: DC | PRN

## 2019-07-18 SURGICAL SUPPLY — 30 items
ATTRACTOMAT 16X20 MAGNETIC DRP (DRAPES) ×2 IMPLANT
BLADE SURG 15 STRL LF DISP TIS (BLADE) ×1 IMPLANT
BLADE SURG 15 STRL SS (BLADE) ×1
CHLORAPREP W/TINT 26 (MISCELLANEOUS) ×2 IMPLANT
CLIP VESOCCLUDE MED 6/CT (CLIP) ×4 IMPLANT
CLIP VESOCCLUDE SM WIDE 6/CT (CLIP) ×4 IMPLANT
COVER SURGICAL LIGHT HANDLE (MISCELLANEOUS) ×2 IMPLANT
COVER WAND RF STERILE (DRAPES) ×2 IMPLANT
DERMABOND ADVANCED (GAUZE/BANDAGES/DRESSINGS) ×1
DERMABOND ADVANCED .7 DNX12 (GAUZE/BANDAGES/DRESSINGS) IMPLANT
DRAPE LAPAROTOMY T 98X78 PEDS (DRAPES) ×2 IMPLANT
ELECT PENCIL ROCKER SW 15FT (MISCELLANEOUS) ×2 IMPLANT
ELECT REM PT RETURN 15FT ADLT (MISCELLANEOUS) ×2 IMPLANT
GAUZE 4X4 16PLY RFD (DISPOSABLE) ×2 IMPLANT
GLOVE SURG ORTHO 8.0 STRL STRW (GLOVE) ×2 IMPLANT
GOWN STRL REUS W/TWL XL LVL3 (GOWN DISPOSABLE) ×6 IMPLANT
HEMOSTAT SURGICEL 2X4 FIBR (HEMOSTASIS) IMPLANT
ILLUMINATOR WAVEGUIDE N/F (MISCELLANEOUS) IMPLANT
KIT BASIN OR (CUSTOM PROCEDURE TRAY) ×2 IMPLANT
KIT TURNOVER KIT A (KITS) IMPLANT
NDL HYPO 25X1 1.5 SAFETY (NEEDLE) ×1 IMPLANT
NEEDLE HYPO 25X1 1.5 SAFETY (NEEDLE) ×2 IMPLANT
PACK BASIC VI WITH GOWN DISP (CUSTOM PROCEDURE TRAY) ×2 IMPLANT
SUT MNCRL AB 4-0 PS2 18 (SUTURE) ×2 IMPLANT
SUT VIC AB 3-0 SH 18 (SUTURE) ×2 IMPLANT
SYR BULB IRRIGATION 50ML (SYRINGE) ×2 IMPLANT
SYR CONTROL 10ML LL (SYRINGE) ×2 IMPLANT
TOWEL OR 17X26 10 PK STRL BLUE (TOWEL DISPOSABLE) ×2 IMPLANT
TOWEL OR NON WOVEN STRL DISP B (DISPOSABLE) ×2 IMPLANT
TUBING CONNECTING 10 (TUBING) ×2 IMPLANT

## 2019-07-18 NOTE — Interval H&P Note (Signed)
History and Physical Interval Note:  07/18/2019 8:55 AM  Corky Downs  has presented today for surgery, with the diagnosis of PRIMARY HYPERPARATHYROIDISM.  The various methods of treatment have been discussed with the patient and family. After consideration of risks, benefits and other options for treatment, the patient has consented to    Procedure(s): RIGHT INFERIOR PARATHYROIDECTOMY (Right) as a surgical intervention.    The patient's history has been reviewed, patient examined, no change in status, stable for surgery.  I have reviewed the patient's chart and labs.  Questions were answered to the patient's satisfaction.    Armandina Gemma, [place] [facility] Office: [place]

## 2019-07-18 NOTE — Anesthesia Procedure Notes (Addendum)
Procedure Name: Intubation Date/Time: 07/18/2019 9:17 AM Performed by: Sharlette Dense, CRNA Patient Re-evaluated:Patient Re-evaluated prior to induction Oxygen Delivery Method: Circle system utilized Preoxygenation: Pre-oxygenation with 100% oxygen Induction Type: IV induction Ventilation: Mask ventilation without difficulty and Oral airway inserted - appropriate to patient size Laryngoscope Size: Sabra Heck and 2 Grade View: Grade II Tube type: Reinforced Tube size: 7.0 mm Number of attempts: 1 Airway Equipment and Method: Stylet Placement Confirmation: ETT inserted through vocal cords under direct vision,  positive ETCO2 and breath sounds checked- equal and bilateral Secured at: 20 cm Tube secured with: Tape Dental Injury: Teeth and Oropharynx as per pre-operative assessment

## 2019-07-18 NOTE — Op Note (Signed)
OPERATIVE REPORT - PARATHYROIDECTOMY  Preoperative diagnosis: Primary hyperparathyroidism  Postop diagnosis: Same  Procedure: Right inferior minimally invasive parathyroidectomy  Surgeon:  Armandina Gemma, MD  Assistant:  Carlena Hurl, PA-C  Anesthesia: General endotracheal  Estimated blood loss: Minimal  Preparation: ChloraPrep  Indications: Patient is referred by Dr. Philemon Kingdom for surgical evaluation and management of suspected primary hyperparathyroidism. Patient has had long-standing hypercalcemia. She has had markedly elevated intact PTH levels on occasion. She has had a normal 24-hour urine collection for calcium. Patient does have complications including osteoporosis, history of nephrolithiasis, and chronic fatigue. She also has chronic kidney disease. Patient lives in [place]. However, her daughter lives in [place], [place]. Her endocrinologist in [place] has retired. Patient has transferred her care here to [facility]. Patient presents today for parathyroidectomy.  Procedure: The patient was prepared in the pre-operative holding area. The patient was brought to the operating room and placed in a supine position on the operating room table. Following administration of general anesthesia, the patient was positioned and then prepped and draped in the usual strict aseptic fashion. After ascertaining that an adequate level of anesthesia been achieved, a neck incision was made with a #15 blade. Dissection was carried through subcutaneous tissues and platysma. Hemostasis was obtained with the electrocautery. Skin flaps were developed circumferentially and a Weitlander retractor was placed for exposure.  Strap muscles were incised in the midline. Strap muscles were reflected lateralley exposing the thyroid lobe. With gentle blunt dissection the thyroid lobe was mobilized.  Dissection was carried through adipose tissue and an enlarged parathyroid gland was  identified. It was gently mobilized. Vascular structures were divided between small ligaclips. Care was taken to avoid the recurrent laryngeal nerve and the esophagus. The parathyroid gland was completely excised. It was submitted to pathology where frozen section confirmed parathyroid tissue consistent with adenoma.  Further exploration on the right side reveals a normal right superior gland just above the level of the inferior thyroid artery.  This is left in situ.  Neck was irrigated with warm saline and good hemostasis was noted. Fibrillar was placed in the operative field. Strap muscles were approximated in the midline with interrupted 3-0 Vicryl sutures. Platysma was closed with interrupted 3-0 Vicryl sutures. Marcaine was infiltrated circumferentially. Skin was closed with a running 4-0 Monocryl subcuticular suture. Wound was washed and dried and Dermabond was applied. Patient was awakened from anesthesia and brought to the recovery room. The patient tolerated the procedure well.   Armandina Gemma, MD [facility] Office: [phone]

## 2019-07-18 NOTE — Transfer of Care (Signed)
Immediate Anesthesia Transfer of Care Note  Patient: Teresa Caldwell  Procedure(s) Performed: RIGHT INFERIOR PARATHYROIDECTOMY (Right Neck)  Patient Location: PACU  Anesthesia Type:General  Level of Consciousness: awake  Airway & Oxygen Therapy: Patient Spontanous Breathing and Patient connected to face mask oxygen  Post-op Assessment: Report given to RN and Post -op Vital signs reviewed and stable  Post vital signs: Reviewed and stable  Last Vitals:  Vitals Value Taken Time  BP    Temp    Pulse 84 07/18/19 1020  Resp 19 07/18/19 1020  SpO2 100 % 07/18/19 1020  Vitals shown include unvalidated device data.  Last Pain:  Vitals:   07/18/19 0757  TempSrc:   PainSc: 0-No pain      Patients Stated Pain Goal: 4 (62/83/66 2947)  Complications: No apparent anesthesia complications

## 2019-07-18 NOTE — Anesthesia Postprocedure Evaluation (Signed)
Anesthesia Post Note  Patient: Teresa Caldwell  Procedure(s) Performed: RIGHT INFERIOR PARATHYROIDECTOMY (Right Neck)     Patient location during evaluation: PACU Anesthesia Type: General Level of consciousness: awake and sedated Pain management: pain level controlled Vital Signs Assessment: post-procedure vital signs reviewed and stable Respiratory status: spontaneous breathing Cardiovascular status: stable Postop Assessment: no apparent nausea or vomiting Anesthetic complications: no    Last Vitals:  Vitals:   07/18/19 1708 07/18/19 2142  BP: 138/66 139/62  Pulse: 82 83  Resp:  18  Temp: 36.7 C 36.7 C  SpO2: 95% 94%    Last Pain:  Vitals:   07/18/19 2142  TempSrc: Oral  PainSc:    Pain Goal: Patients Stated Pain Goal: 3 (07/18/19 2017)                 Huston Foley

## 2019-07-19 ENCOUNTER — Encounter (HOSPITAL_COMMUNITY): Payer: Self-pay | Admitting: Surgery

## 2019-07-19 DIAGNOSIS — E039 Hypothyroidism, unspecified: Secondary | ICD-10-CM | POA: Diagnosis not present

## 2019-07-19 DIAGNOSIS — N189 Chronic kidney disease, unspecified: Secondary | ICD-10-CM | POA: Diagnosis not present

## 2019-07-19 DIAGNOSIS — I129 Hypertensive chronic kidney disease with stage 1 through stage 4 chronic kidney disease, or unspecified chronic kidney disease: Secondary | ICD-10-CM | POA: Diagnosis not present

## 2019-07-19 DIAGNOSIS — M1990 Unspecified osteoarthritis, unspecified site: Secondary | ICD-10-CM | POA: Diagnosis not present

## 2019-07-19 DIAGNOSIS — E210 Primary hyperparathyroidism: Secondary | ICD-10-CM | POA: Diagnosis not present

## 2019-07-19 DIAGNOSIS — D351 Benign neoplasm of parathyroid gland: Secondary | ICD-10-CM | POA: Diagnosis not present

## 2019-07-19 LAB — BASIC METABOLIC PANEL
Anion gap: 7 (ref 5–15)
BUN: 31 mg/dL — ABNORMAL HIGH (ref 8–23)
CO2: 21 mmol/L — ABNORMAL LOW (ref 22–32)
Calcium: 8.4 mg/dL — ABNORMAL LOW (ref 8.9–10.3)
Chloride: 108 mmol/L (ref 98–111)
Creatinine, Ser: 1.88 mg/dL — ABNORMAL HIGH (ref 0.44–1.00)
GFR calc Af Amer: 29 mL/min — ABNORMAL LOW (ref >60)
GFR calc non Af Amer: 25 mL/min — ABNORMAL LOW (ref >60)
Glucose, Bld: 187 mg/dL — ABNORMAL HIGH (ref 70–99)
Potassium: 4.6 mmol/L (ref 3.5–5.1)
Sodium: 136 mmol/L (ref 135–145)

## 2019-07-19 NOTE — Plan of Care (Signed)
All goals met for d/c 

## 2019-07-19 NOTE — Discharge Summary (Signed)
Physician Discharge Summary [facility]  Patient ID: Teresa Caldwell MRN: [id number] DOB/AGE: 78-Dec-1942 78 y.o.  Admit date: 07/18/2019 Discharge date: 07/19/2019  Admission Diagnoses:  Primary hyperparathyroidism  Discharge Diagnoses:  Principal Problem:   Hyperparathyroidism, primary ([place]) Active Problems:   Primary hyperparathyroidism [facility])   Discharged Condition: good  Hospital Course: Patient was admitted for observation following parathyroid surgery.  Post op course was uncomplicated.  Pain was well controlled.  Tolerated diet.  Post op calcium level on morning following surgery was 8.4 mg/dl.  Patient was prepared for discharge home on POD#1.  Consults: None  Treatments: surgery: right inferior parathyroidectomy  Discharge Exam: Blood pressure 132/61, pulse 74, temperature 98.7 F (37.1 C), temperature source Oral, resp. rate 13, height 5\' 3"  (1.6 m), weight 79.9 kg, SpO2 96 %. HEENT - clear Neck - wound dry and intact; mild STS, ecchymosis; voice normal Chest - clear bilaterally Cor - RRR  Disposition: Home  Discharge Instructions    Diet - low sodium heart healthy   Complete by: As directed    Discharge instructions   Complete by: As directed    [facility]  THYROID & PARATHYROID SURGERY:  POST-OP INSTRUCTIONS  Always review your discharge instruction sheet from the facility where your surgery was performed.  A prescription for pain medication may be given to you upon discharge.  Take your pain medication as prescribed.  If narcotic pain medicine is not needed, then you may take acetaminophen (Tylenol) or ibuprofen (Advil) as needed.  Take your usually prescribed medications unless otherwise directed.  If you need a refill on your pain medication, please contact our office during regular business hours.  Prescriptions cannot be processed by our office after 5 pm or on weekends.  Start with a light diet upon arrival  home, such as soup and crackers or toast.  Be sure to drink plenty of fluids daily.  Resume your normal diet the day after surgery.  Most patients will experience some swelling and bruising on the chest and neck area.  Ice packs will help.  Swelling and bruising can take several days to resolve.   It is common to experience some constipation after surgery.  Increasing fluid intake and taking a stool softener (Colace) will usually help or prevent this problem.  A mild laxative (Milk of Magnesia or Miralax) should be taken according to package directions if there has been no bowel movement after 48 hours.  You have steri-strips and a gauze dressing over your incision.  You may remove the gauze bandage on the second day after surgery, and you may shower at that time.  Leave your steri-strips (small skin tapes) in place directly over the incision.  These strips should remain on the skin for 5-7 days and then be removed.  You may get them wet in the shower and pat them dry.  You may resume regular (light) daily activities beginning the next day (such as daily self-care, walking, climbing stairs) gradually increasing activities as tolerated.  You may have sexual intercourse when it is comfortable.  Refrain from any heavy lifting or straining until approved by your doctor.  You may drive when you no longer are taking prescription pain medication, you can comfortably wear a seatbelt, and you can safely maneuver your car and apply brakes.  You should see your doctor in the office for a follow-up appointment approximately three weeks after your surgery.  Make sure that you call for this appointment  within a day or two after you arrive home to insure a convenient appointment time.  WHEN TO CALL YOUR DOCTOR: -- Fever greater than 101.5 -- Inability to urinate -- Nausea and/or vomiting - persistent -- Extreme swelling or bruising -- Continued bleeding from incision -- Increased pain, redness, or drainage from  the incision -- Difficulty swallowing or breathing -- Muscle cramping or spasms -- Numbness or tingling in hands or around lips  The clinic staff is available to answer your questions during regular business hours.  Please don't hesitate to call and ask to speak to one of the nurses if you have concerns.  Armandina Gemma, MD [facility] Office: [phone]   Ice pack   Complete by: As directed    Increase activity slowly   Complete by: As directed    No dressing needed   Complete by: As directed      Allergies as of 07/19/2019      Reactions   Bactrim [sulfamethoxazole-trimethoprim] Dermatitis   blisters   Ciprofloxacin Hives   Digoxin And Related Hives   And blisters (eyes & lips)   Sulfa Antibiotics Hives   Tape Other (See Comments)   Needs tape for SENSITIVE SKIN   Clindamycin/lincomycin Rash      Medication List    TAKE these medications   alendronate 35 MG tablet Commonly known as: FOSAMAX Take 35 mg by mouth every 7 (seven) days.   amiodarone 100 MG tablet Commonly known as: PACERONE Take 100 mg by mouth every morning.   amLODipine 10 MG tablet Commonly known as: NORVASC Take 10 mg by mouth every evening.   cloNIDine 0.1 MG tablet Commonly known as: CATAPRES Take 0.1 mg by mouth See admin instructions. Take 0.1 mg by mouth twice daily as needed only if systolic reading is 850 or greater   Eliquis 5 MG Tabs tablet Generic drug: apixaban Take 5 mg by mouth 2 (two) times daily.   Ferrex 150 150 MG capsule Generic drug: iron polysaccharides Take 150 mg by mouth every evening.   levothyroxine 75 MCG tablet Commonly known as: SYNTHROID Take 1 tablet (75 mcg total) by mouth daily.   lisinopril 40 MG tablet Commonly known as: ZESTRIL Take 40 mg by mouth every morning.   metoCLOPramide 10 MG tablet Commonly known as: Reglan Take 0.5 tablets (5 mg total) by mouth every 6 (six) hours as needed for nausea.   traZODone 50 MG tablet  Commonly known as: DESYREL Take 25-50 mg by mouth at bedtime as needed for sleep.   vitamin B-12 1000 MCG tablet Commonly known as: CYANOCOBALAMIN Take 1,000 mcg by mouth daily.   VITAMIN D-3 PO Take 2,000 Units by mouth 2 (two) times daily.      Follow-up Information    Armandina Gemma, MD. Schedule an appointment as soon as possible for a visit in 3 weeks.   Specialty: General Surgery Why: For wound re-check Contact information: [place] [address] [phone]           Earnstine Regal, MD, [facility] Office: [phone]   Signed: Armandina Gemma 07/19/2019, 10:45 AM

## 2019-07-19 NOTE — Progress Notes (Signed)
Pt alert, oriented, and tolerating diet. No complaints of pain. D/C instructions given, pt d/cd home.

## 2019-07-25 ENCOUNTER — Other Ambulatory Visit (INDEPENDENT_AMBULATORY_CARE_PROVIDER_SITE_OTHER): Payer: Self-pay | Admitting: Internal Medicine

## 2019-07-25 DIAGNOSIS — E039 Hypothyroidism, unspecified: Secondary | ICD-10-CM

## 2019-07-27 ENCOUNTER — Other Ambulatory Visit (INDEPENDENT_AMBULATORY_CARE_PROVIDER_SITE_OTHER): Payer: Self-pay | Admitting: Internal Medicine

## 2019-07-27 MED ORDER — LEVOTHYROXINE 75 MCG TABLET
75.00 ug | ORAL_TABLET | Freq: Every morning | ORAL | 4 refills | Status: AC
Start: 2019-07-27 — End: ?

## 2019-08-01 DIAGNOSIS — Z9089 Acquired absence of other organs: Secondary | ICD-10-CM | POA: Diagnosis not present

## 2019-08-01 DIAGNOSIS — D351 Benign neoplasm of parathyroid gland: Secondary | ICD-10-CM | POA: Diagnosis not present

## 2019-08-15 ENCOUNTER — Encounter (INDEPENDENT_AMBULATORY_CARE_PROVIDER_SITE_OTHER): Payer: Self-pay | Admitting: Internal Medicine

## 2019-08-30 DIAGNOSIS — I129 Hypertensive chronic kidney disease with stage 1 through stage 4 chronic kidney disease, or unspecified chronic kidney disease: Secondary | ICD-10-CM | POA: Diagnosis not present

## 2019-08-30 DIAGNOSIS — N281 Cyst of kidney, acquired: Secondary | ICD-10-CM | POA: Diagnosis not present

## 2019-08-30 DIAGNOSIS — N184 Chronic kidney disease, stage 4 (severe): Secondary | ICD-10-CM | POA: Diagnosis not present

## 2019-08-30 DIAGNOSIS — D649 Anemia, unspecified: Secondary | ICD-10-CM | POA: Diagnosis not present

## 2019-08-30 DIAGNOSIS — E213 Hyperparathyroidism, unspecified: Secondary | ICD-10-CM | POA: Diagnosis not present

## 2019-09-06 DIAGNOSIS — N281 Cyst of kidney, acquired: Secondary | ICD-10-CM | POA: Diagnosis not present

## 2019-09-06 DIAGNOSIS — I129 Hypertensive chronic kidney disease with stage 1 through stage 4 chronic kidney disease, or unspecified chronic kidney disease: Secondary | ICD-10-CM | POA: Diagnosis not present

## 2019-09-06 DIAGNOSIS — N184 Chronic kidney disease, stage 4 (severe): Secondary | ICD-10-CM | POA: Diagnosis not present

## 2019-09-08 ENCOUNTER — Other Ambulatory Visit: Payer: Self-pay

## 2019-09-12 ENCOUNTER — Encounter: Payer: Self-pay | Admitting: Internal Medicine

## 2019-09-12 ENCOUNTER — Other Ambulatory Visit: Payer: Self-pay

## 2019-09-12 ENCOUNTER — Ambulatory Visit (INDEPENDENT_AMBULATORY_CARE_PROVIDER_SITE_OTHER): Payer: Medicare Other | Admitting: Internal Medicine

## 2019-09-12 VITALS — BP 130/80 | HR 90 | Ht 63.25 in | Wt 179.0 lb

## 2019-09-12 DIAGNOSIS — E210 Primary hyperparathyroidism: Secondary | ICD-10-CM

## 2019-09-12 DIAGNOSIS — Z23 Encounter for immunization: Secondary | ICD-10-CM

## 2019-09-12 DIAGNOSIS — M810 Age-related osteoporosis without current pathological fracture: Secondary | ICD-10-CM

## 2019-09-12 DIAGNOSIS — E039 Hypothyroidism, unspecified: Secondary | ICD-10-CM

## 2019-09-12 DIAGNOSIS — E559 Vitamin D deficiency, unspecified: Secondary | ICD-10-CM | POA: Diagnosis not present

## 2019-09-12 LAB — T4, FREE: Free T4: 1.31 ng/dL (ref 0.60–1.60)

## 2019-09-12 LAB — TSH: TSH: 3.47 u[IU]/mL (ref 0.35–4.50)

## 2019-09-12 LAB — VITAMIN D 25 HYDROXY (VIT D DEFICIENCY, FRACTURES): VITD: 76.82 ng/mL (ref 30.00–100.00)

## 2019-09-12 NOTE — Progress Notes (Signed)
Patient ID: Teresa Caldwell, female   DOB: [birth year], 78 y.o.   MRN: [id number]    HPI  Teresa Caldwell is a 78 y.o.-year-old female, initially referred by her cardiologist, Dr. Haroldine Laws (previously saw Dr. Delia Chimes in [facility]), returning for follow-up for hypercalcemia/hyperparathyroidism. She was seeing endocrinology in [place], [place], Dr. Bobbe Medico, now retired.  Last visit with me a year ago.  She is here with her husband who offers most of the history especially related to her recent surgery, but also other past medical history.  Since last visit, she had right inferior parathyroidectomy on 07/18/2019 with Dr. Harlow Asa. Pathology: 1 cm parathyroid adenoma. Calcium level postop was 8.9, PTH slightly high, at 74.  She feels slightly better after her surgery, with less fatigue.  Hypercalcemia  -Diagnosed in 2004 - was followed by her endocrinologist in [place]. They were following the levels of the calcium and PTH.  Her calcium started to increase in 10/2016.  She refused parathyroid surgery in the past, however, afterwards, she agreed to this  Reviewed patient's pertinent labs: Lab Results  Component Value Date   PTH 118 (H) 08/27/2018   PTH 45 02/25/2018   PTH 76 (H) 04/13/2017   CALCIUM 8.4 (L) 07/19/2019   CALCIUM 9.9 07/14/2019   CALCIUM 10.4 08/27/2018   CALCIUM 9.1 02/25/2018   CALCIUM 10.4 04/13/2017   CALCIUM 11.1 (H) 01/02/2017  07/08/2018 records from PCP: Ca 10.9 (8.5-10.1), PTH 157 (18-80) 07/13/2017: PTH 210, calcium: 10.4 (8.5-10.1) 12/22/2016: Calcium 10.8 (8.5-10.1), iCa 5.91 (4.57-5.43) PTH 256 12/17/2016: Calcium 10.4 (8.5-10.1), Mg  06/25/2015: iCa 1.43 (1.12-1.32), PTH 159  24-hour urine calcium was normal (but slightly low creatinine) Component     Latest Ref Rng & Units 05/14/2017  Calcium, Ur     Not estab mg/dL 8  Calcium, 24 hour urine     35 - 250 mg/24 h 192  Creatinine, Urine     20 - 320 mg/dL 24  Creatinine, 24H Ur     0.63 - 2.50 g/24 h 0.58  (L)   She has osteoporosis.  Reviewed most recent T-scores: 07/13/2018:  L spine -2.2 (-5.7%*) L hip: -2.9 (no change)  She started Fosamax 07/2018.  No fractures, but had several falls in the past.  Last in 2017.  She has a history of 3 kidney stones, last in 2015, for which she had lithotripsy.  + CKD.  She sees neurology. Last BUN/Cr: Last week:  GFR 20 Lab Results  Component Value Date   BUN 31 (H) 07/19/2019   BUN 35 (H) 07/14/2019   CREATININE 1.88 (H) 07/19/2019   CREATININE 1.89 (H) 07/14/2019  07/08/2018: 26/1.6 02/03/2018:  BUN/Cr 25/1.7, GFR 35 12/22/2016: 20/1.4, EGFR 37 12/17/2016: 18/1.5, EGFR 34  She stopped HCTZ in 10/2016.  Vitamin D deficiency:  Most recent vitamin D levels were normal, Lab Results  Component Value Date   VD25OH 69.16 08/27/2018   VD25OH 59.73 02/25/2018   VD25OH 46.04 04/13/2017   VD25OH 25.93 (L) 02/06/2017  07/13/2017: 43.3 12/17/2016: Vitamin D 22.8 06/25/2015: Vitamin D 19.2 Her cardiologist added a vitamin D 2000 IU/day >> we increased the dose to 4000 units in 01/2017.  Pt does not have a FH of hypercalcemia, pituitary tumors. Mother and sister had osteoporosis.  Sister and daughter had thyroid cancer.  Hypothyroidism  -Apparently diagnosed before starting amiodarone.  She was able to reduce the dose of levothyroxine when her amiodarone dose was reduced in 2018.  She is not taking levothyroxine 75 mcg (1.5  of 50 mcg tablets) 6 out of 7 days, dose reduced 07/2018. - in am - fasting - at least 30 min from b'fast - no Ca, MVI, PPIs - + Fe at night - not on Biotin  Latest TFTs: 07/08/2018: TSH apparently suppressed (I do not have these records), after which her levothyroxine dose was reduced Lab Results  Component Value Date   TSH 2.05 02/25/2018   TSH 1.35 04/13/2017   TSH 3.59 02/06/2017   FREET4 1.58 02/25/2018   FREET4 1.45 04/13/2017   FREET4 1.40 02/06/2017  07/13/2017: 1.07 12/17/2016: TSH  1.69  07/13/2017: TPO Abs <28  She and her husband are driving from 5 hours away to come see their daughter.  ROS: Constitutional: no weight gain/no weight loss, + fatigue, no subjective hyperthermia, + subjective hypothermia, + nocturia Eyes: + blurry vision (Plaquenil), no xerophthalmia ENT: no sore throat, no nodules palpated in neck, no dysphagia, no odynophagia, no hoarseness Cardiovascular: no CP/no SOB/no palpitations/no leg swelling Respiratory: no cough/no SOB/no wheezing Gastrointestinal: no N/no V/no D/no C/no acid reflux Musculoskeletal: no muscle aches/+ joint aches Skin: no rashes, + hair loss Neurological: no tremors/no numbness/no tingling/no dizziness  I reviewed pt's medications, allergies, PMH, social hx, family hx, and changes were documented in the history of present illness. Otherwise, unchanged from my initial visit note.  Patient Active Problem List   Diagnosis Date Noted  . Vitamin D insufficiency 02/06/2017    Priority: High  . Hypothyroidism 02/06/2017    Priority: High  . Hyperparathyroidism, primary ([place]) 02/06/2017    Priority: High  . Osteoporosis 02/06/2017    Priority: High  . Primary hyperparathyroidism ([place]) 07/18/2019  . Glucose intolerance (impaired glucose tolerance) 02/06/2017  . Essential hypertension 02/06/2017  . Atrial fibrillation ([place]) 02/06/2017  . OSA on CPAP 02/06/2017  . B12 deficiency 02/06/2017  . Hyperlipidemia 02/06/2017  . IDA (iron deficiency anemia) 02/06/2017   Past Surgical History:  Procedure Laterality Date  . TONSILLECTOMY  1956   . WRIST ARTHROSCOPY Left 1994   Also: - Hysterectomy 1989 - Lithotripsy x3 - Cataract L and R 2018   Social History   Social History  . Marital status: Married    Spouse name: N/A  . Number of children: 5 (1 daughter lives in [place])   Occupational History  . n/a   Social History Main Topics  . Smoking status: Never Smoker  . Smokeless tobacco: Never Used  . Alcohol  use No  . Drug use: No   Current Outpatient Medications on File Prior to Visit  Medication Sig Dispense Refill  . alendronate (FOSAMAX) 35 MG tablet Take 35 mg by mouth every 7 (seven) days.  3  . amiodarone (PACERONE) 100 MG tablet Take 100 mg by mouth every morning.    Marland Kitchen amLODipine (NORVASC) 10 MG tablet Take 10 mg by mouth every evening.     Marland Kitchen apixaban (ELIQUIS) 5 MG TABS tablet Take 5 mg by mouth 2 (two) times daily.    . Cholecalciferol (VITAMIN D-3 PO) Take 2,000 Units by mouth 2 (two) times daily.     . cloNIDine (CATAPRES) 0.1 MG tablet Take 0.1 mg by mouth See admin instructions. Take 0.1 mg by mouth twice daily as needed only if systolic reading is 338 or greater    . iron polysaccharides (FERREX 150) 150 MG capsule Take 150 mg by mouth every evening.    Marland Kitchen levothyroxine (SYNTHROID, LEVOTHROID) 75 MCG tablet Take 1 tablet (75 mcg total) by mouth daily. [place]  tablet 2  . lisinopril (PRINIVIL,ZESTRIL) 40 MG tablet Take 40 mg by mouth every morning.     . metoCLOPramide (REGLAN) 10 MG tablet Take 0.5 tablets (5 mg total) by mouth every 6 (six) hours as needed for nausea. (Patient not taking: Reported on 07/11/2019) 4 tablet 0  . traZODone (DESYREL) 50 MG tablet Take 25-50 mg by mouth at bedtime as needed for sleep.    . vitamin B-12 (CYANOCOBALAMIN) 1000 MCG tablet Take 1,000 mcg by mouth daily.     No current facility-administered medications on file prior to visit.    Allergies  Allergen Reactions  . Bactrim [Sulfamethoxazole-Trimethoprim] Dermatitis    blisters  . Ciprofloxacin Hives  . Digoxin And Related Hives    And blisters (eyes & lips)  . Sulfa Antibiotics Hives  . Tape Other (See Comments)    Needs tape for SENSITIVE SKIN  . Clindamycin/Lincomycin Rash   Family History  Problem Relation Age of Onset  . Hypertension Mother   . Heart disease Mother   . Diabetes Child   . Hypertension Child   . Thyroid disease Child   . Cancer Child     PE: LMP  (LMP Unknown)  Wt  Readings from Last 3 Encounters:  07/18/19 176 lb 3.2 oz (79.9 kg)  07/14/19 176 lb 3.2 oz (79.9 kg)  08/27/18 176 lb (79.8 kg)   Constitutional: overweight, in NAD Eyes: PERRLA, EOMI, no exophthalmos ENT: moist mucous membranes, no thyromegaly, cervical incision site healed, no cervical lymphadenopathy Cardiovascular: RRR, No MRG, + bilateral pitting edema  Respiratory: CTA B Gastrointestinal: abdomen soft, NT, ND, BS+ Musculoskeletal: no deformities, strength intact in all 4 Skin: moist, warm, no rashes Neurological: no tremor with outstretched hands, DTR normal in all 4  Assessment: 1. Hypercalcemia/hyperparathyroidism - She does have complications from hypercalcemia: + h/o nephrolithiasis, + osteoporosis, no fractures. No abdominal pain, depression, bone pain.  2. Vit D insufficiency  3. Hypothyroidism  4.  Osteoporosis  Fax results to Dr. Ophelia Shoulder (fax [phone]) and Dr. Candiss Norse.   Plan: 1. And 2.  Patient with a history of elevated calcium (highest 11.1) and also very high intact PTH, at 256 (corresponding to a calcium level of 10.8). A 24-hour urine calcium was normal, as was her vitamin D.  She brought records from PCP in which the calcium and the PTH were both high.  -At last visit, biochemical investigation pointed towards primary hyperparathyroidism.  Calcium was in the upper limit of normal then, but this was likely due to Prolia. -She met criteria for parathyroid surgery (kidney disease, osteoporosis, nephrolithiasis) -She had right inferior parathyroidectomy with Dr. Harlow Asa on 07/18/2019. -She feels very good after the surgery, without complaints.  Cervical incision site healed almost completely. -Reviewed PTH and calcium postop and the calcium was normal while PTH was slightly above the upper limit of normal, but much improved. -We will repeat them today, however, she does have worsening CKD and this may influence the results. -She continues on 4000 units vitamin D  daily.  We will recheck this today. - I will see the patient back in 1 year.  3. Hypothyroidism -Reportedly diagnosed before starting amiodarone - latest thyroid labs reviewed with pt >> TSH low 07/2018 after which her levothyroxine dose was reduced to 6 out of 7 days Lab Results  Component Value Date   TSH 2.05 02/25/2018  - she continues on LT4 75 mcg daily 6 out of 7 days - pt feels good on this dose. - we  discussed about taking the thyroid hormone every day, with water, >30 minutes before breakfast, separated by >4 hours from acid reflux medications, calcium, iron, multivitamins. Pt. is taking it correctly. - will check thyroid tests today: TSH and fT4 - If labs are abnormal, she will need to return for repeat TFTs in 1.5 months  4.  Osteoporosis -No fractures or falls -Continues Fosamax. -Recent parathyroidectomy likely contributing to decreased risk of fracture within the next 3 years -She is due for another bone density scan next year.  For results, call daughter Cecille Rubin: [phone]  Component     Latest Ref Rng & Units 09/12/2019  Calcium     8.7 - 10.3 mg/dL 9.3  PTH, Intact     15 - 65 pg/mL 71 (H)  PTH Interp      Comment  VITD     30.00 - 100.00 ng/mL 76.82  TSH     0.35 - 4.50 uIU/mL 3.47  T4,Free(Direct)     0.60 - 1.60 ng/dL 1.31   Thyroid tests are normal.  Vitamin D also normal.  Calcium normal, but PTH still slightly above the upper limit of normal.  My suspicion is that this can be related to her declining kidney function.  We will continue to follow this for now.  Philemon Kingdom, MD PhD [facility]

## 2019-09-12 NOTE — Patient Instructions (Signed)
Please stop at the lab.  Continue vitamin D 4000 units daily.  Please come back for a follow-up appointment in 1 year.

## 2019-09-13 ENCOUNTER — Telehealth: Payer: Self-pay

## 2019-09-13 DIAGNOSIS — I1310 Hypertensive heart and chronic kidney disease without heart failure, with stage 1 through stage 4 chronic kidney disease, or unspecified chronic kidney disease: Secondary | ICD-10-CM | POA: Diagnosis not present

## 2019-09-13 DIAGNOSIS — N281 Cyst of kidney, acquired: Secondary | ICD-10-CM | POA: Diagnosis not present

## 2019-09-13 DIAGNOSIS — I129 Hypertensive chronic kidney disease with stage 1 through stage 4 chronic kidney disease, or unspecified chronic kidney disease: Secondary | ICD-10-CM | POA: Diagnosis not present

## 2019-09-13 DIAGNOSIS — N184 Chronic kidney disease, stage 4 (severe): Secondary | ICD-10-CM | POA: Diagnosis not present

## 2019-09-13 LAB — PTH, INTACT AND CALCIUM
Calcium: 9.3 mg/dL (ref 8.7–10.3)
PTH: 71 pg/mL — ABNORMAL HIGH (ref 15–65)

## 2019-09-13 NOTE — Telephone Encounter (Signed)
-----   Message from Philemon Kingdom, MD sent at 09/13/2019 10:37 AM EDT ----- Lenna Sciara, can you please call patient's daughter Cecille Rubin: [phone]:  Thyroid tests are normal.  Vitamin D also normal.  Calcium normal, but PTH still slightly above the upper limit of normal.  My suspicion is that this can be related to her lower kidney function.  We will continue to follow this for now.

## 2019-09-13 NOTE — Telephone Encounter (Signed)
Notified patient daughter of message from Dr. Cruzita Lederer, she expressed understanding and agreement. No further questions.

## 2019-09-26 DIAGNOSIS — E213 Hyperparathyroidism, unspecified: Secondary | ICD-10-CM | POA: Diagnosis not present

## 2019-09-26 DIAGNOSIS — I129 Hypertensive chronic kidney disease with stage 1 through stage 4 chronic kidney disease, or unspecified chronic kidney disease: Secondary | ICD-10-CM | POA: Diagnosis not present

## 2019-09-26 DIAGNOSIS — D649 Anemia, unspecified: Secondary | ICD-10-CM | POA: Diagnosis not present

## 2019-09-26 DIAGNOSIS — N281 Cyst of kidney, acquired: Secondary | ICD-10-CM | POA: Diagnosis not present

## 2019-09-26 DIAGNOSIS — N184 Chronic kidney disease, stage 4 (severe): Secondary | ICD-10-CM | POA: Diagnosis not present

## 2019-09-26 DIAGNOSIS — R600 Localized edema: Secondary | ICD-10-CM | POA: Diagnosis not present

## 2019-09-30 DIAGNOSIS — N281 Cyst of kidney, acquired: Secondary | ICD-10-CM | POA: Diagnosis not present

## 2019-09-30 DIAGNOSIS — I129 Hypertensive chronic kidney disease with stage 1 through stage 4 chronic kidney disease, or unspecified chronic kidney disease: Secondary | ICD-10-CM | POA: Diagnosis not present

## 2019-09-30 DIAGNOSIS — N184 Chronic kidney disease, stage 4 (severe): Secondary | ICD-10-CM | POA: Diagnosis not present

## 2019-10-06 ENCOUNTER — Other Ambulatory Visit (INDEPENDENT_AMBULATORY_CARE_PROVIDER_SITE_OTHER): Payer: Self-pay | Admitting: Internal Medicine

## 2019-10-06 DIAGNOSIS — M810 Age-related osteoporosis without current pathological fracture: Secondary | ICD-10-CM

## 2019-10-06 MED ORDER — AMLODIPINE 10 MG TABLET
10.0000 mg | ORAL_TABLET | Freq: Every day | ORAL | 4 refills | Status: DC
Start: 2019-10-06 — End: 2019-12-07

## 2019-10-06 MED ORDER — ALENDRONATE 35 MG TABLET
35.00 mg | ORAL_TABLET | ORAL | 3 refills | Status: AC
Start: 2019-10-06 — End: ?

## 2019-10-13 DIAGNOSIS — R94110 Abnormal electro-oculogram [EOG]: Secondary | ICD-10-CM | POA: Diagnosis not present

## 2019-10-13 DIAGNOSIS — H469 Unspecified optic neuritis: Secondary | ICD-10-CM | POA: Diagnosis not present

## 2019-10-13 DIAGNOSIS — H3589 Other specified retinal disorders: Secondary | ICD-10-CM | POA: Diagnosis not present

## 2019-10-13 DIAGNOSIS — H30143 Acute posterior multifocal placoid pigment epitheliopathy, bilateral: Secondary | ICD-10-CM | POA: Diagnosis not present

## 2019-10-13 DIAGNOSIS — H53453 Other localized visual field defect, bilateral: Secondary | ICD-10-CM | POA: Diagnosis not present

## 2019-10-13 DIAGNOSIS — H3500 Unspecified background retinopathy: Secondary | ICD-10-CM | POA: Diagnosis not present

## 2019-10-13 DIAGNOSIS — H5362 Acquired night blindness: Secondary | ICD-10-CM | POA: Diagnosis not present

## 2019-10-13 DIAGNOSIS — R94111 Abnormal electroretinogram [ERG]: Secondary | ICD-10-CM | POA: Diagnosis not present

## 2019-10-13 DIAGNOSIS — H468 Other optic neuritis: Secondary | ICD-10-CM | POA: Diagnosis not present

## 2019-10-13 DIAGNOSIS — D8989 Other specified disorders involving the immune mechanism, not elsewhere classified: Secondary | ICD-10-CM | POA: Diagnosis not present

## 2019-11-28 ENCOUNTER — Encounter (INDEPENDENT_AMBULATORY_CARE_PROVIDER_SITE_OTHER): Payer: Self-pay | Admitting: Internal Medicine

## 2019-12-05 ENCOUNTER — Other Ambulatory Visit: Payer: Self-pay

## 2019-12-05 ENCOUNTER — Ambulatory Visit (INDEPENDENT_AMBULATORY_CARE_PROVIDER_SITE_OTHER): Payer: Medicare Other | Admitting: Internal Medicine

## 2019-12-05 VITALS — Ht 64.0 in | Wt 178.0 lb

## 2019-12-05 DIAGNOSIS — E039 Hypothyroidism, unspecified: Secondary | ICD-10-CM

## 2019-12-05 DIAGNOSIS — I10 Essential (primary) hypertension: Secondary | ICD-10-CM

## 2019-12-05 DIAGNOSIS — I4891 Unspecified atrial fibrillation: Secondary | ICD-10-CM

## 2019-12-05 DIAGNOSIS — I480 Paroxysmal atrial fibrillation: Secondary | ICD-10-CM

## 2019-12-05 DIAGNOSIS — N184 Chronic kidney disease, stage 4 (severe): Secondary | ICD-10-CM

## 2019-12-05 DIAGNOSIS — Z9989 Dependence on other enabling machines and devices: Secondary | ICD-10-CM

## 2019-12-05 DIAGNOSIS — M810 Age-related osteoporosis without current pathological fracture: Secondary | ICD-10-CM

## 2019-12-05 DIAGNOSIS — G4700 Insomnia, unspecified: Secondary | ICD-10-CM

## 2019-12-05 DIAGNOSIS — M5134 Other intervertebral disc degeneration, thoracic region: Secondary | ICD-10-CM

## 2019-12-05 DIAGNOSIS — G4733 Obstructive sleep apnea (adult) (pediatric): Secondary | ICD-10-CM

## 2019-12-05 DIAGNOSIS — M5030 Other cervical disc degeneration, unspecified cervical region: Secondary | ICD-10-CM

## 2019-12-05 DIAGNOSIS — Z6830 Body mass index (BMI) 30.0-30.9, adult: Secondary | ICD-10-CM

## 2019-12-05 NOTE — Progress Notes (Signed)
[facility], Medical Staff Office [place]  [place] [address]  Phone: [phone]  Fax: [phone]       NAME: Cynthia Barrera  DOB: 26-Mar-1941  MRN: [id number]  DOS: 12/05/2019    This visit was conducted via Telehealth at the patient's request, Consent was given to the office by patient  Mode: telephone, audio only    Primary Care Physician: Carin Hock, DO    History of Present Illness: 79 y.o. female presenting with chief complaint of Back Pain   previous patient of Dr. Darleen Crocker, needs to establish care    recent abscess of tooth and seeing dentist for that  still having some back pain with chores around house and has to sit down after, tylenol helps but does not take regularly, does not want PT    ROS: Positives: tooth infection, back pain  All other systems have been reviewed and are negative    Past Medical, Surgical, Social and Family History were reviewed  Medications:   Current Outpatient Medications   Medication Sig   . Alendronate (FOSAMAX) 35 mg Oral Tablet Take 1 Tab (35 mg total) by mouth Every 7 days   . amiodarone (PACERONE) 100 mg Oral Tablet Take 100 mg by mouth Once a day   . amLODIPine (NORVASC) 10 mg Oral Tablet Take 1 Tab (10 mg total) by mouth Once a day   . apixaban (ELIQUIS) 5 mg Oral Tablet Take 5 mg by mouth Twice daily   . cholecalciferol, vitamin D3, 1,000 unit Oral Tablet Take 2,000 Units by mouth Twice daily    . cyanocobalamin (VITAMIN B 12) 1,000 mcg Oral Tablet Take 1,000 mcg by mouth Once a day   . levothyroxine (SYNTHROID) 75 mcg Oral Tablet Take 1 Tab (75 mcg total) by mouth Every morning   . lisinopril (PRINIVIL) 40 mg Oral Tablet TAKE ONE TABLET BY MOUTH ONCE DAILY   . polysaccharide iron complex (FERREX 150) 150 mg iron Oral Capsule Take 1 capsule by mouth once daily   . traZODone (DESYREL) 50 mg Oral Tablet Take 1 Tab (50 mg total) by mouth Every night (Patient not taking: Reported on 11/28/2019)          Exam:  Vitals reported: Ht 1.626 m (5\' 4" )   Wt 80.7 kg (178 lb)   BMI 30.55 kg/m      Body mass index is 30.55 kg/m.   114/72, hr 99  General: NAD, Alert and oriented  Lung: Breathing comfortably on room air, no wheezing heard  Psych: mood and behavior appropriate    Reviewed records, imaging, and labs in Epic and [facility]    Assessment & Plan:  No diagnosis found.    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 Ophelia Shoulder  Afib: on amiodarone by Dr. Delia Chimes, eliquis  Hypertension: Norvasc 10 mg, lisinopril 40 mg  Hypothyroidism: on synthroid  Insomnia: trazodone prn  osa on cpap    Preventative health care:  mammogram 12/2018 normal, gets through Dr. Frederic Jericho  nonsmoker    Return in about 6 months (around 06/03/2020).      I spent 20 minutes of total time performing medical services for the patient  on the date of the encounter.   Carin Hock, DO

## 2019-12-06 DIAGNOSIS — G4700 Insomnia, unspecified: Secondary | ICD-10-CM | POA: Insufficient documentation

## 2019-12-06 DIAGNOSIS — N184 Chronic kidney disease, stage 4 (severe): Secondary | ICD-10-CM | POA: Insufficient documentation

## 2019-12-07 ENCOUNTER — Other Ambulatory Visit (INDEPENDENT_AMBULATORY_CARE_PROVIDER_SITE_OTHER): Payer: Self-pay | Admitting: Internal Medicine

## 2019-12-08 MED ORDER — AMLODIPINE 10 MG TABLET
10.0000 mg | ORAL_TABLET | Freq: Every day | ORAL | 4 refills | Status: DC
Start: 2019-12-08 — End: 2022-09-15

## 2020-03-22 ENCOUNTER — Other Ambulatory Visit (INDEPENDENT_AMBULATORY_CARE_PROVIDER_SITE_OTHER): Payer: Self-pay | Admitting: Internal Medicine

## 2020-03-22 MED ORDER — LISINOPRIL 40 MG TABLET
ORAL_TABLET | ORAL | 3 refills | Status: AC
Start: 2020-03-22 — End: ?

## 2020-04-04 ENCOUNTER — Other Ambulatory Visit (INDEPENDENT_AMBULATORY_CARE_PROVIDER_SITE_OTHER): Payer: Self-pay | Admitting: Internal Medicine

## 2020-04-04 DIAGNOSIS — D509 Iron deficiency anemia, unspecified: Secondary | ICD-10-CM

## 2020-04-09 ENCOUNTER — Other Ambulatory Visit (INDEPENDENT_AMBULATORY_CARE_PROVIDER_SITE_OTHER): Payer: Self-pay | Admitting: Internal Medicine

## 2020-04-09 DIAGNOSIS — D509 Iron deficiency anemia, unspecified: Secondary | ICD-10-CM

## 2020-04-09 MED ORDER — POLYSACCHARIDE IRON COMPLEX 150 MG IRON CAPSULE
ORAL_CAPSULE | ORAL | 3 refills | Status: AC
Start: 2020-04-09 — End: ?

## 2020-04-29 ENCOUNTER — Emergency Department (EMERGENCY_DEPARTMENT_HOSPITAL)

## 2020-04-29 ENCOUNTER — Other Ambulatory Visit: Payer: Self-pay

## 2020-04-29 ENCOUNTER — Encounter (HOSPITAL_COMMUNITY): Payer: Self-pay

## 2020-04-29 ENCOUNTER — Emergency Department
Admission: EM | Admit: 2020-04-29 | Discharge: 2020-04-29 | Disposition: A | Discharge status: Home / Self Care | Payer: Medicare Other | Attending: Emergency Medicine | Admitting: Emergency Medicine

## 2020-04-29 DIAGNOSIS — M533 Sacrococcygeal disorders, not elsewhere classified: Secondary | ICD-10-CM | POA: Insufficient documentation

## 2020-04-29 DIAGNOSIS — W19XXXA Unspecified fall, initial encounter: Secondary | ICD-10-CM

## 2020-04-29 DIAGNOSIS — M5430 Sciatica, unspecified side: Secondary | ICD-10-CM

## 2020-04-29 DIAGNOSIS — W010XXA Fall on same level from slipping, tripping and stumbling without subsequent striking against object, initial encounter: Secondary | ICD-10-CM | POA: Insufficient documentation

## 2020-04-29 DIAGNOSIS — M47816 Spondylosis without myelopathy or radiculopathy, lumbar region: Secondary | ICD-10-CM

## 2020-04-29 MED ORDER — PREDNISONE 20 MG TABLET
20.00 mg | ORAL_TABLET | Freq: Every day | ORAL | 0 refills | Status: AC
Start: 2020-04-29 — End: 2020-05-04

## 2020-04-29 MED ORDER — HYDROCODONE 5 MG-ACETAMINOPHEN 325 MG TABLET
1.00 | ORAL_TABLET | Freq: Every evening | ORAL | 0 refills | Status: DC | PRN
Start: 2020-04-29 — End: 2022-09-15

## 2020-04-29 NOTE — ED Provider Notes (Signed)
.  Department of Emergency [facility]  04/29/2020            Patient is a 79 y.o. female presenting to the ED with chief complaint of Fall      HPI   79 year old female presents to the ER with a fall 1 week ago, and sacral pain.  She reports that she was making her bed whenever she tripped over a pair of her husband's jeans that was hanging on the end of her bed, she caught her foot in the gene causing the fall.  She denies any loss of consciousness, she does report she thinks she sort hit her head whenever she fail however her main concern is her buttocks.  She  reports that she has been taking over-the-counter Tylenol, and applying heat, and this does help however once the Tylenol wears off the pain does return so this is why she decided to come in for evaluation.  Past Medical History:   Diagnosis Date   . Atrial fibrillation (CMS HCC)    . Hypertension    . Kidney stones    . Thyroid disease      Past Surgical History:   Procedure Laterality Date   . Hx cyst incision and drainage     . Hx parathyroidectomy     . Hx partial hysterectomy     . Hx partial thyroidectomy     . Hx tonsil and adenoidectomy     . Hx tubal ligation     . Hx wrist fracture tx Left      Allergies   Allergen Reactions   . Bactrim [Sulfamethoxazole-Trimethoprim]    . Ciprofloxacin    . Digoxin    . Plaquenil [Hydroxychloroquine]    . Simvastatin       Current Outpatient Medications   Medication Sig   . Alendronate (FOSAMAX) 35 mg Oral Tablet Take 1 Tab (35 mg total) by mouth Every 7 days   . amiodarone (PACERONE) 100 mg Oral Tablet Take 100 mg by mouth Once a day   . amLODIPine (NORVASC) 10 mg Oral Tablet Take 1 Tab (10 mg total) by mouth Once a day   . apixaban (ELIQUIS) 5 mg Oral Tablet Take 5 mg by mouth Twice daily   . cholecalciferol, vitamin D3, 1,000 unit Oral Tablet Take 2,000 Units by mouth Twice daily    . cyanocobalamin (VITAMIN B 12) 1,000 mcg Oral Tablet Take 1,000 mcg by mouth Once a day   .  HYDROcodone-acetaminophen (NORCO) 5-325 mg Oral Tablet Take 1 Tablet by mouth Every night as needed for Pain   . levothyroxine (SYNTHROID) 75 mcg Oral Tablet Take 1 Tab (75 mcg total) by mouth Every morning   . lisinopriL (PRINIVIL) 40 mg Oral Tablet TAKE ONE TABLET BY MOUTH ONCE DAILY   . polysaccharide iron complex (FERREX 150) 150 mg iron Oral Capsule Take 1 capsule by mouth once daily   . predniSONE (DELTASONE) 20 mg Oral Tablet Take 1 Tablet (20 mg total) by mouth Once a day for 5 days   . traZODone (DESYREL) 50 mg Oral Tablet Take 1 Tab (50 mg total) by mouth Every night      Above history reviewed with patient.  Previous records also reviewed.     Review of Systems   Musculoskeletal: Negative for back pain and neck pain.        Sacral pain.       Filed Vitals:    04/29/20 1410 04/29/20  1500 04/29/20 1530   BP: 114/76 (!) 118/48 (!) 133/52   Pulse: 82 67    Resp: 18 18    Temp: 36.2 C (97.1 F)     SpO2: 97% 98% 96%       PHYSICAL EXAM:   GENERAL APPEARANCE: No apparent distress  HEAD: Normocephalic  EYES: PERRL, EOMI, conjunctiva nonerythematous  NOSE: Normal nasal mucosa.  THROAT: Pharynx nonerythematous, tonsils appear normal  NECK: Full ROM, no menigismus, no cervical spine tenderness to palpation.   CARDIAC: Regular rate and rhythm. No murmurs, rubs or gallops.  RESPIRATORY: Lungs are clear to auscultation bilaterally. No wheezes, rubs or rhonchi. Normal work of breathing.  ABDOMEN: No organomegaly. Soft, non-tender, non-distended. + Bowel sounds. No rebound, guarding or distention.  EXTREMITIES: Non-tender, full range of motion. No edema. Normal in appearance.  SKIN: No lesions.  NEURO:  Normal strength in UE and LE. Normal sensation in UE and LE.   PSYCH: Normal affect and mood.  MUSC: denies lower back pain reports sacral pain radiating down to coccyx/ tail bone.   Procedures    none    Workup:   Alert, verbal no acute distress 79 year old female presented to the ER with complaints of sacral type  coccyx type pain, that she developed after a fall.  She did not want any pain medication while here, she does report that she has been using over-the-counter Tylenol and using a heating pad which she reports does help however the pain does return so she that is why she came in for evaluation.  Patient was examined by me and Dr. Zadie Cleverly , which he believes the pain may be more month sciatica and bruised muscle/ bruised tailbone  because whenever he examined her she reported that it we even further down into her leg.  She will be given a few tablets of Norco, and prednisone for inflammation, will proceed with discharge.    No results found for this or any previous visit (from the past 24 hour(s)).    Results for orders placed or performed during the hospital encounter of 04/29/20 (from the past 72 hour(s))   XR LUMBOSACRAL SPINE     Status: None    Narrative    Cynthia Barrera  Female, 79 years old.    XR LUMBOSACRAL SPINE performed on 04/29/2020 2:27 PM.    REASON FOR EXAM:  fall    TECHNIQUE: 5 views/5 images submitted for interpretation.    COMPARISON:  05/07/2010    FINDINGS:  There is normal alignment of the lumbar spine. Vertebral body  heights are preserved with no evidence of acute fracture or dislocation.  Mild loss of intervertebral disc is seen and there is mild facet  arthropathy.      Impression    1.  No acute abnormality of the lumbar spine.  2.  Mild degenerative changes      Radiologist location ID: [place] This Encounter   . XR LUMBOSACRAL SPINE   . HYDROcodone-acetaminophen (NORCO) 5-325 mg Oral Tablet   . predniSONE (DELTASONE) 20 mg Oral Tablet       ECG:     Abnormal Lab results:  Labs Reviewed - No data to display    ED Course:  Appropriate labs and imaging ordered. Medical Records reviewed.   During the patient's stay in the emergency department, the above listed imaging and/or labs were performed to assist with medical decision making and were reviewed by  myself when  available for review.   ED Course as of Apr 29 1546   Sun Apr 29, 2020   1540 Seen and examined patient.  Perform thorough examination and more so I believe that there is no bony prominence that is an issue.  The x-ray will confirm this that there is no fracture but I do not even believe that there would be a subacute fracture not caught by x-ray based on the size location.  Realistically it is about where the piriformis would be and sciatic nerve but I believe there is also bruised muscle in that area.  On she could get up and she could walk but it is laying down in certain positions that is problematic she does not have any paresthesias with this.  I believe that a few days of a steroid would be reasonable 20 mg of prednisone daily for 5 days and then she can have 5 tablets of Norco but use this sparingly only at nighttime and only half a tablet due to her age so we do not have a fall and a hip fracture instead of just a bruise muscle.    [SR]      ED Course User Index  [SR] Reinert, Shawn, DO    Pt remained stable throughout the emergency department course.      Impression:    Encounter Diagnoses   Name Primary?   Marland Kitchen Accidental fall, initial encounter Yes   . Sciatica, unspecified laterality       Disposition:  Discharged   New Prescriptions    HYDROCODONE-ACETAMINOPHEN (NORCO) 5-325 MG ORAL TABLET    Take 1 Tablet by mouth Every night as needed for Pain    PREDNISONE (DELTASONE) 20 MG ORAL TABLET    Take 1 Tablet (20 mg total) by mouth Once a day for 5 days      Darleen Crocker, DO  [address]  [address]  [place] [id number]  [phone]    In 3 days        Follow Up Plan:  Follow-up with PCP or return to the emergency room with any worsening symptoms.

## 2020-04-29 NOTE — Discharge Instructions (Addendum)
Medications as directed, follow-up with your PCP in 3-5 days if you continue to have problems.  Return to ER for any worsening symptoms.

## 2020-04-29 NOTE — ED Triage Notes (Addendum)
Pt states she fell about 1 week ago. Tripped over pants in bedroom at home. C/o tailbone pain. Denies LOC.

## 2020-05-15 IMAGING — MR MR NECK SOFT TISSUE ONLY WO/W CM
4 of 10 series · 19 of 48 positions shown · IV contrast (8ml MULTIHANCE)
Comparison: None.

CLINICAL DATA: Hypercalcemia. 4D CT not performed due to renal
insufficiency. Half dose contrast was administered for MRI.

EXAM:
MRI OF THE NECK WITH CONTRAST
TECHNIQUE: Multiplanar, multisequence MR imaging was performed following the
administration of intravenous contrast.
CONTRAST:  8mL MULTIHANCE GADOBENATE DIMEGLUMINE 529 MG/ML IV SOLN

[Series 2: T1 · sagittal · 4.0mm · 0.45mm/px · 5 of 27 slices shown]
[im 1/27]
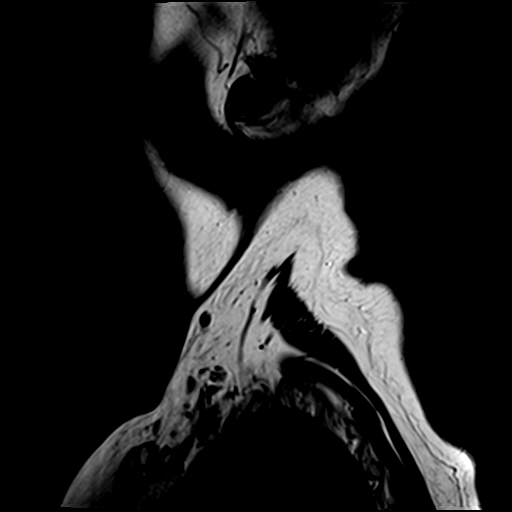
[im 7/27]
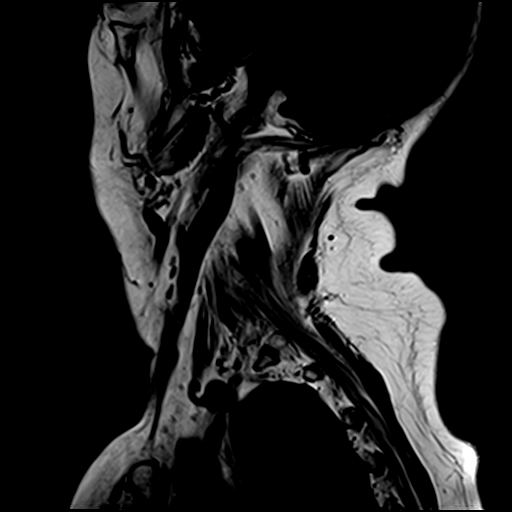
[im 14/27]
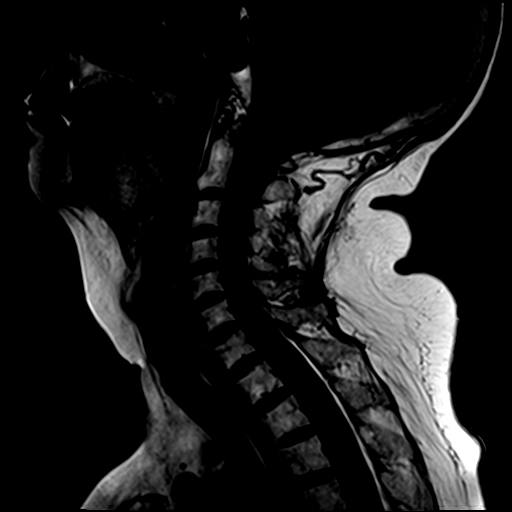
[im 20/27]
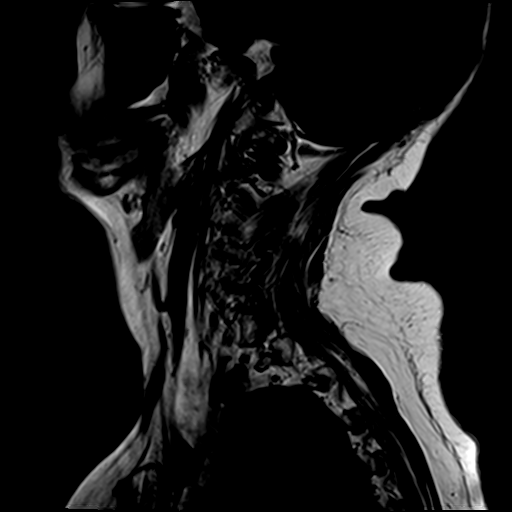
[im 27/27]
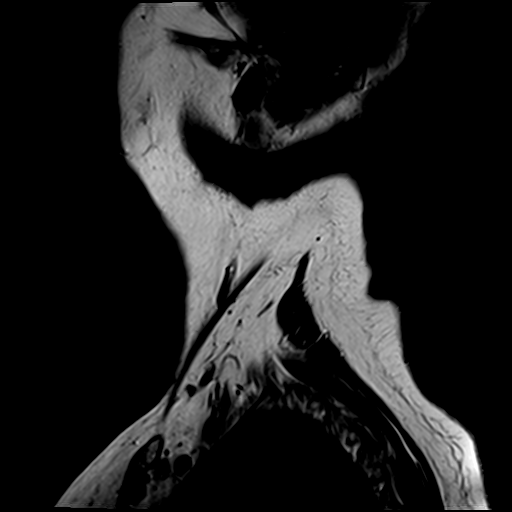

[Series 3: T2 fat-sat · sagittal · 4.0mm · 0.43mm/px · 5 of 27 slices shown (1 of 3)]
[im 1/27]
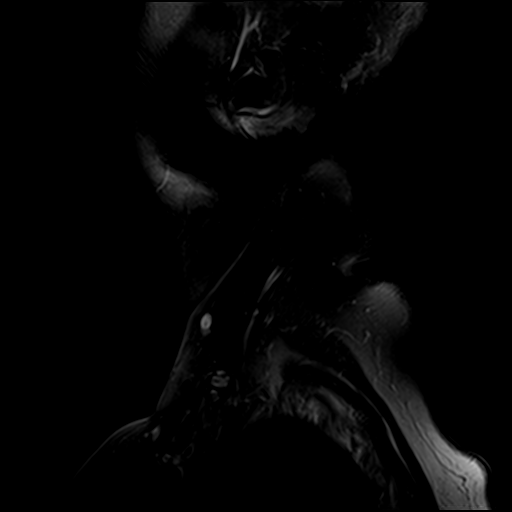
[im 7/27]
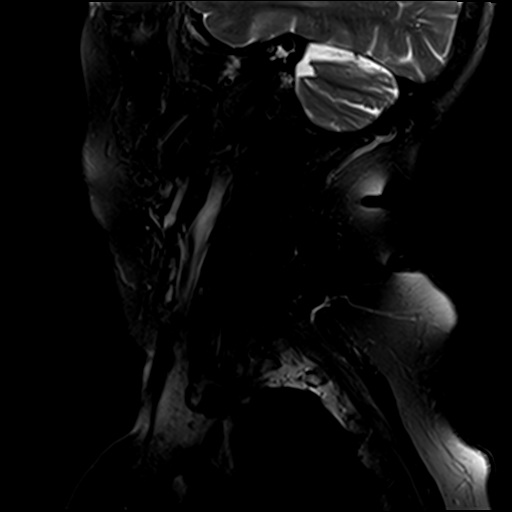
[im 14/27]
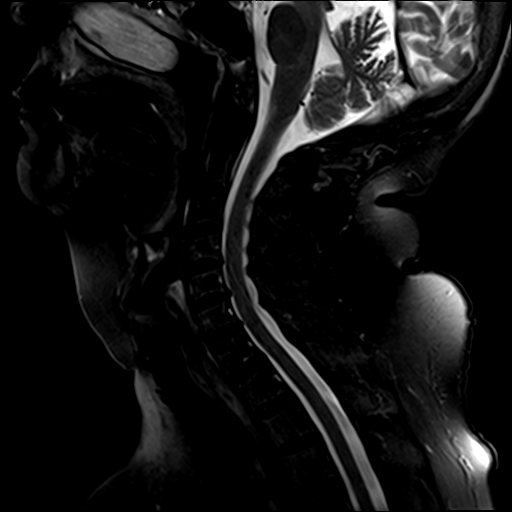
[im 20/27]
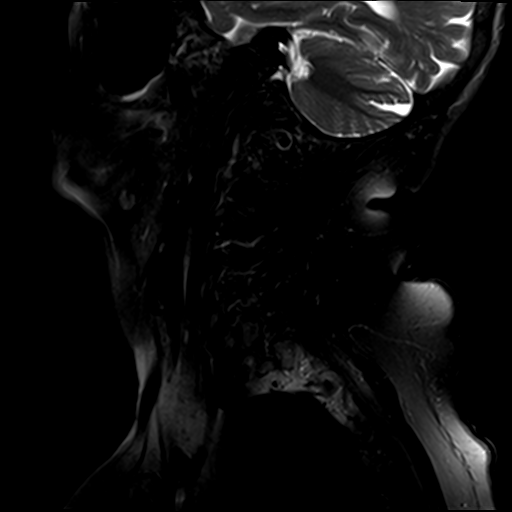
[im 27/27]
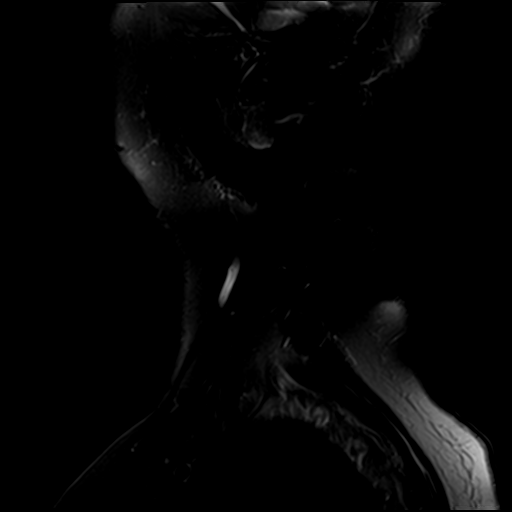

[Series 5: T2 fat-sat · coronal · 4.0mm · 0.43mm/px · 3 of 20 slices shown (2 of 3)]
[im 1/20]
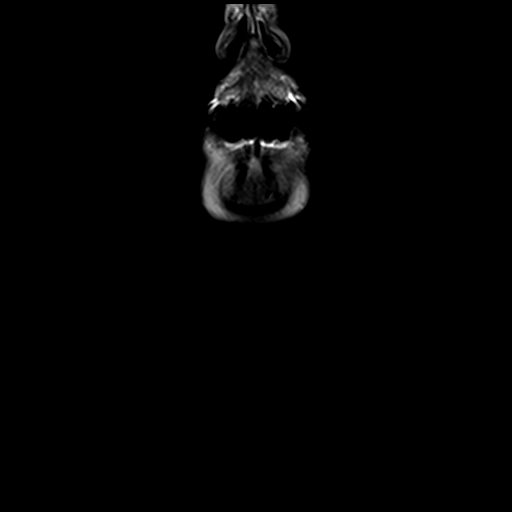
[im 10/20]
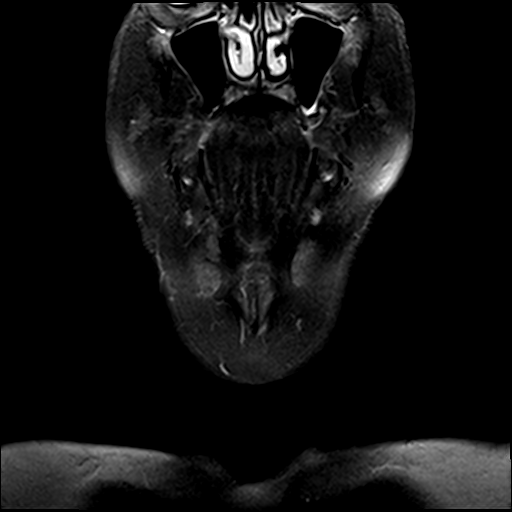
[im 20/20]
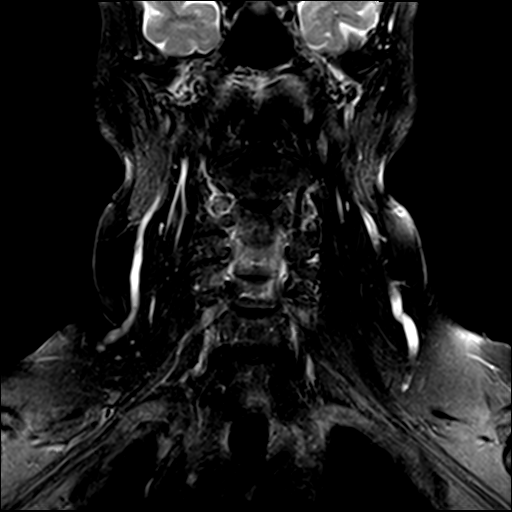

[Series 8: T2 fat-sat · axial · 4.0mm · 0.43mm/px · z∈[-106,+50]mm · 6 of 34 slices shown (3 of 3)]
[im 1/34]
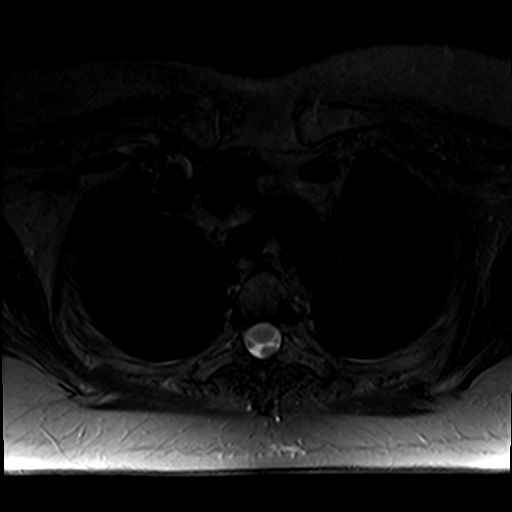
[im 7/34]
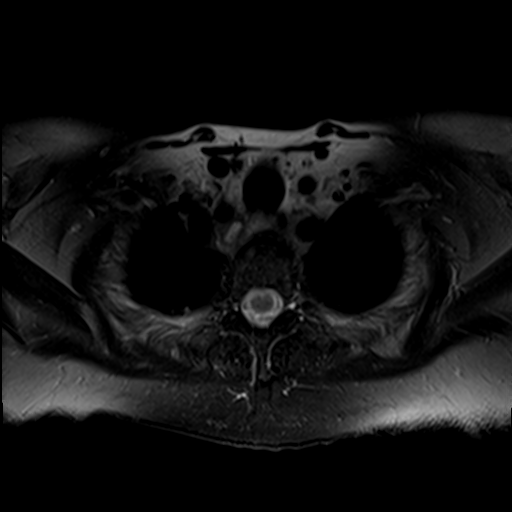
[im 14/34]
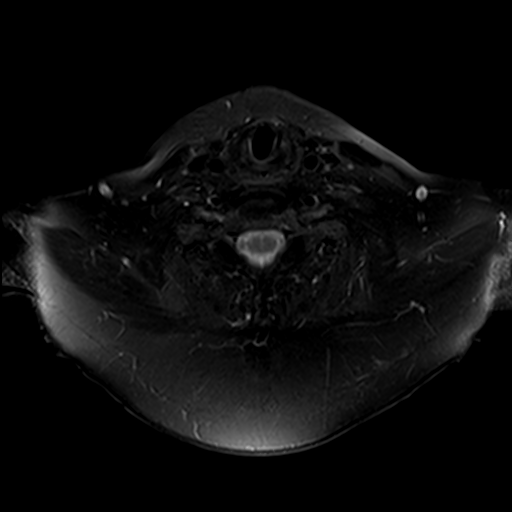
[im 20/34]
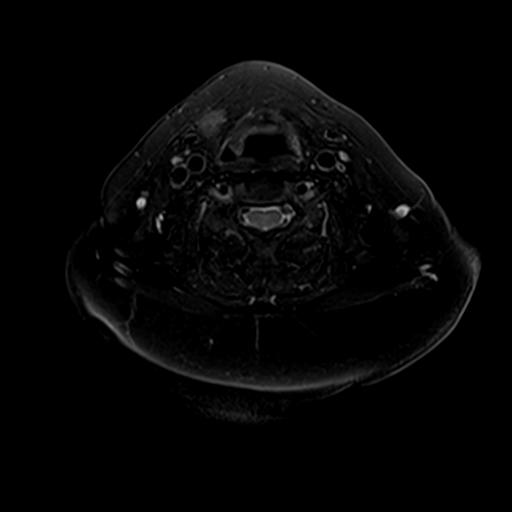
[im 27/34]
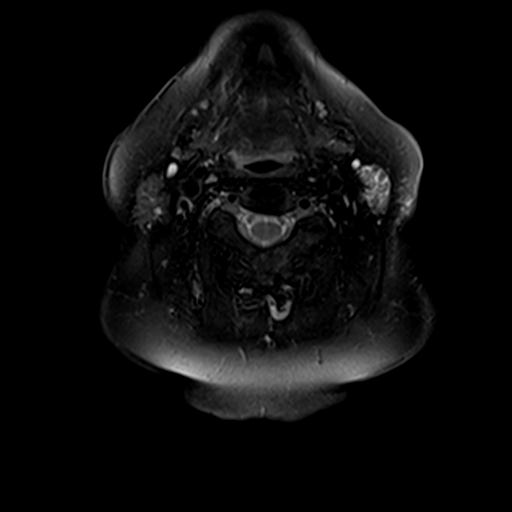
[im 34/34]
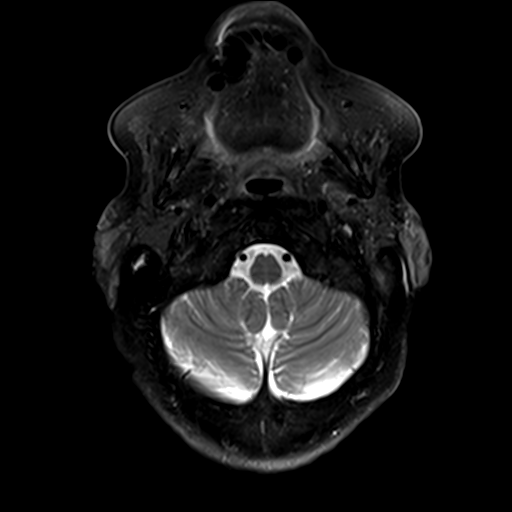

[19 of 48 positions shown; findings below may reference images not displayed]

FINDINGS: Pharynx and larynx: No focal mucosal or submucosal lesions are
present. Nasopharynx is clear. Soft palate is within normal limits.
Tongue base and oropharynx are within normal limits. Epiglottis and
hypopharynx are within normal limits.

Salivary glands: Submandibular and visualized parotid glands are
normal bilaterally.

Thyroid: Thyroid is normal bilaterally. There is extension
posteriorly and inferiorly on the right which raises concern for a
parathyroid adenoma. There does appear to be some enhancement. The
signal changes may be artifactual due to incomplete fat saturation
anteriorly. No other parathyroid lesions are evident.

Lymph nodes: No significant cervical adenopathy is present.

Vascular: Flow is present in the major arteries.

Limited intracranial: Visualized cerebellum is within normal limits.
Spinal canal is unremarkable.

Skeleton: No significant stenosis is present

Upper chest: Lung apices are clear.  Thoracic inlet is normal.
IMPRESSION: 1. Asymmetric enhancing soft tissue posteriorly and inferiorly along
the right lobe of the thyroid concerning for in the inferior right
parathyroid adenoma.
2. No other parathyroid lesions.

## 2020-06-11 ENCOUNTER — Encounter (INDEPENDENT_AMBULATORY_CARE_PROVIDER_SITE_OTHER): Payer: Self-pay | Admitting: Family Medicine

## 2020-06-22 ENCOUNTER — Encounter (INDEPENDENT_AMBULATORY_CARE_PROVIDER_SITE_OTHER): Payer: Self-pay | Admitting: Family Medicine

## 2020-09-17 ENCOUNTER — Encounter: Payer: Self-pay | Admitting: Internal Medicine

## 2020-09-17 ENCOUNTER — Ambulatory Visit (INDEPENDENT_AMBULATORY_CARE_PROVIDER_SITE_OTHER): Payer: Medicare Other | Admitting: Internal Medicine

## 2020-09-17 ENCOUNTER — Other Ambulatory Visit: Payer: Self-pay

## 2020-09-17 VITALS — BP 134/74 | HR 82 | Ht 63.0 in | Wt 175.8 lb

## 2020-09-17 DIAGNOSIS — E039 Hypothyroidism, unspecified: Secondary | ICD-10-CM | POA: Diagnosis not present

## 2020-09-17 DIAGNOSIS — M810 Age-related osteoporosis without current pathological fracture: Secondary | ICD-10-CM

## 2020-09-17 DIAGNOSIS — E210 Primary hyperparathyroidism: Secondary | ICD-10-CM

## 2020-09-17 DIAGNOSIS — E559 Vitamin D deficiency, unspecified: Secondary | ICD-10-CM | POA: Diagnosis not present

## 2020-09-17 LAB — TSH: TSH: 6.24 u[IU]/mL — ABNORMAL HIGH (ref 0.35–4.50)

## 2020-09-17 LAB — T4, FREE: Free T4: 1.34 ng/dL (ref 0.60–1.60)

## 2020-09-17 NOTE — Patient Instructions (Addendum)
Please stop at the lab.  Continue vitamin D 4000 units daily.  Try to get a new Bone density scan on the same machine as before.  Continue Levothyroxine 75 mcg daily.  Take the thyroid hormone every day, with water, at least 30 minutes before breakfast, separated by at least 4 hours from: - acid reflux medications - calcium - iron - multivitamins  Please come back for a follow-up appointment in 1 year.

## 2020-09-17 NOTE — Progress Notes (Signed)
Patient ID: Teresa Caldwell, female   DOB: August 12, 1941, 79 y.o.   MRN: [id number]   This visit occurred during the SARS-CoV-2 public health emergency.  Safety protocols were in place, including screening questions prior to the visit, additional usage of staff PPE, and extensive cleaning of exam room while observing appropriate contact time as indicated for disinfecting solutions.   HPI  Teresa Caldwell is a 79 y.o.-year-old female, initially referred by her cardiologist, Dr. Haroldine Caldwell (previously saw Dr. 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 She was seeing endocrinology in [place], [place], Dr. Bobbe Caldwell, now retired.  Last visit with me 1 year ago.  She is here with her husband who offers most of the history especially related to her past medical history, symptoms, medications.   Hypercalcemia  -Diagnosed in 2004 - was followed by her endocrinologist in [place]. They were following the levels of the calcium and PTH.  Her calcium started to increase in 10/2016.  She had right inferior parathyroidectomy on 07/18/2019 with Dr. Harlow Asa.  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  Calcium level postop was 8.9, PTH slightly high at 74.  She felt better after surgery, with less fatigue.  Since the Sx >> BP improved >> off Clonidine.  Reviewed pertinent labs: Lab Results  Component Value Date   PTH 71 (H) 09/12/2019   PTH Comment 09/12/2019   PTH 118 (H) 08/27/2018   PTH 45 02/25/2018   PTH 76 (H) 04/13/2017   CALCIUM 9.3 09/12/2019   CALCIUM 8.4 (L) 07/19/2019   CALCIUM 9.9 07/14/2019   CALCIUM 10.4 08/27/2018   CALCIUM 9.1 02/25/2018   CALCIUM 10.4 04/13/2017   CALCIUM 11.1 (H) 01/02/2017  07/08/2018 records from PCP: Ca 10.9 (8.5-10.1), PTH 157 (18-80) 07/13/2017: PTH 210, calcium: 10.4 (8.5-10.1) 12/22/2016: Calcium 10.8 (8.5-10.1), iCa 5.91 (4.57-5.43) PTH 256 12/17/2016: Calcium 10.4 (8.5-10.1), Mg  06/25/2015: iCa 1.43 (1.12-1.32), PTH  159  24-hour urine calcium was normal (but slightly low creatinine) Component     Latest Ref Rng & Units 05/14/2017  Calcium, Ur     Not estab mg/dL 8  Calcium, 24 hour urine     35 - 250 mg/24 h 192  Creatinine, Urine     20 - 320 mg/dL 24  Creatinine, 24H Ur     0.63 - 2.50 g/24 h 0.58 (L)   She has a history of osteoporosis.  Reviewed most recent T-scores: 07/13/2018:  L spine -2.2 (-5.7%*) L hip: -2.9 (no change)  She started Fosamax in 07/2018.  No fractures.  She had several falls in the past.  She has a history of 3 kidney stones, last in 2015, for which she had lithotripsy.  + CKD she sees nephrology; last BUN/Cr: 05/2020: GFR 31 reportedly 09/2019:  GFR 20 Lab Results  Component Value Date   BUN 31 (H) 07/19/2019   BUN 35 (H) 07/14/2019   CREATININE 1.88 (H) 07/19/2019   CREATININE 1.89 (H) 07/14/2019  07/08/2018: 26/1.6 02/03/2018:  BUN/Cr 25/1.7, GFR 35 12/22/2016: 20/1.4, EGFR 37 12/17/2016: 18/1.5, EGFR 34  She stopped HCTZ in 10/2016.  Vitamin D deficiency:  Most recent vitamin D level was normal goal Lab Results  Component Value Date   VD25OH 76.82 09/12/2019   VD25OH 69.16 08/27/2018   VD25OH 59.73 02/25/2018   VD25OH 46.04 04/13/2017   VD25OH 25.93 (L) 02/06/2017  07/13/2017: 43.3 12/17/2016: Vitamin D 22.8 06/25/2015: Vitamin D 19.2 Her cardiologist added a vitamin D 2000 IU/day >> we increased the dose to 4000  units in 01/2017.  She continues on this dose now.  Pt does not have a FH of hypercalcemia, pituitary tumors. Mother and sister had osteoporosis.  Sister and daughter had thyroid cancer.  Hypothyroidism  -Apparently diagnosed before starting amiodarone.  She was able to reduce the dose of levothyroxine when her amiodarone dose was reduced in 2018  She is now taking levothyroxine 75 mcg 6 out of 7 days (1.5 to 50 mcg tablets), dose reduced 07/2018: - in am - fasting - at least 30 min from b'fast - no Ca, MVI, PPIs, + Fe at  night - not on Biotin  Reviewed her TFTs: 07/08/2018: TSH apparently suppressed (I do not have these records), after which her levothyroxine dose was reduced Lab Results  Component Value Date   TSH 3.47 09/12/2019   TSH 2.05 02/25/2018   TSH 1.35 04/13/2017   TSH 3.59 02/06/2017   FREET4 1.31 09/12/2019   FREET4 1.58 02/25/2018   FREET4 1.45 04/13/2017   FREET4 1.40 02/06/2017  07/13/2017: 1.07 12/17/2016: TSH 1.69  07/13/2017: TPO Abs <28  She is on Plaquenil.  She and her husband are driving from 5 hours away to come see their daughter.  ROS: Constitutional: no weight gain/no weight loss, + fatigue, no subjective hyperthermia, no subjective hypothermia, + nocturia >> cannot sleep well Eyes: no blurry vision, no xerophthalmia ENT: no sore throat, no nodules palpated in neck, no dysphagia, no odynophagia, no hoarseness Cardiovascular: no CP/no SOB/no palpitations/no leg swelling Respiratory: no cough/no SOB/no wheezing Gastrointestinal: no N/no V/no D/no C/no acid reflux Musculoskeletal: no muscle aches/+ joint aches Skin: no rashes, + hair loss Neurological: no tremors/no numbness/no tingling/no dizziness  I reviewed pt's medications, allergies, PMH, social hx, family hx, and changes were documented in the history of present illness. Otherwise, unchanged from my initial visit note.  Patient Active Problem List   Diagnosis Date Noted  . Vitamin D insufficiency 02/06/2017    Priority: High  . Hypothyroidism 02/06/2017    Priority: High  . Hyperparathyroidism, primary ([place]) 02/06/2017    Priority: High  . Osteoporosis 02/06/2017    Priority: High  . Primary hyperparathyroidism ([place]) 07/18/2019  . Glucose intolerance (impaired glucose tolerance) 02/06/2017  . Essential hypertension 02/06/2017  . Atrial fibrillation ([place]) 02/06/2017  . OSA on CPAP 02/06/2017  . B12 deficiency 02/06/2017  . Hyperlipidemia 02/06/2017  . IDA (iron deficiency anemia) 02/06/2017   Past  Surgical History:  Procedure Laterality Date  . TONSILLECTOMY  1956   . WRIST ARTHROSCOPY Left 1994   Also: - Hysterectomy 1989 - Lithotripsy x3 - Cataract L and R 2018   Social History   Social History  . Marital status: Married    Spouse name: N/A  . Number of children: 5 (1 daughter lives in [place])   Occupational History  . n/a   Social History Main Topics  . Smoking status: Never Smoker  . Smokeless tobacco: Never Used  . Alcohol use No  . Drug use: No   Current Outpatient Medications on File Prior to Visit  Medication Sig Dispense Refill  . alendronate (FOSAMAX) 35 MG tablet Take 35 mg by mouth every 7 (seven) days.  3  . amiodarone (PACERONE) 100 MG tablet Take 100 mg by mouth every morning.    Marland Kitchen amLODipine (NORVASC) 10 MG tablet Take 10 mg by mouth every evening.     Marland Kitchen apixaban (ELIQUIS) 5 MG TABS tablet Take 5 mg by mouth 2 (two) times daily.    Marland Kitchen  Cholecalciferol (VITAMIN D-3 PO) Take 2,000 Units by mouth 2 (two) times daily.     . cloNIDine (CATAPRES) 0.1 MG tablet Take 0.1 mg by mouth See admin instructions. Take 0.1 mg by mouth twice daily as needed only if systolic reading is 924 or greater    . iron polysaccharides (FERREX 150) 150 MG capsule Take 150 mg by mouth every evening.    Marland Kitchen levothyroxine (SYNTHROID, LEVOTHROID) 75 MCG tablet Take 1 tablet (75 mcg total) by mouth daily. 60 tablet 2  . lisinopril (PRINIVIL,ZESTRIL) 40 MG tablet Take 20 mg by mouth every morning.     . metoCLOPramide (REGLAN) 10 MG tablet Take 0.5 tablets (5 mg total) by mouth every 6 (six) hours as needed for nausea. 4 tablet 0  . traZODone (DESYREL) 50 MG tablet Take 25-50 mg by mouth at bedtime as needed for sleep.    . vitamin B-12 (CYANOCOBALAMIN) 1000 MCG tablet Take 1,000 mcg by mouth daily.     No current facility-administered medications on file prior to visit.   Allergies  Allergen Reactions  . Bactrim [Sulfamethoxazole-Trimethoprim] Dermatitis    blisters  .  Ciprofloxacin Hives  . Digoxin And Related Hives    And blisters (eyes & lips)  . Sulfa Antibiotics Hives  . Tape Other (See Comments)    Needs tape for SENSITIVE SKIN  . Clindamycin/Lincomycin Rash   Family History  Problem Relation Age of Onset  . Hypertension Mother   . Heart disease Mother   . Diabetes Child   . Hypertension Child   . Thyroid disease Child   . Cancer Child     PE: BP 134/74   Pulse 82   Ht 5' 3"  (1.6 m)   Wt 175 lb 12.8 oz (79.7 kg)   LMP  (LMP Unknown)   SpO2 97%   BMI 31.14 kg/m  Wt Readings from Last 3 Encounters:  09/17/20 175 lb 12.8 oz (79.7 kg)  09/12/19 179 lb (81.2 kg)  07/18/19 176 lb 3.2 oz (79.9 kg)   Constitutional: overweight, in NAD Eyes: PERRLA, EOMI, no exophthalmos ENT: moist mucous membranes, no thyromegaly, no cervical lymphadenopathy Cardiovascular: RRR, No MRG Respiratory: CTA B Gastrointestinal: abdomen soft, NT, ND, BS+ Musculoskeletal: no deformities, strength intact in all 4 Skin: moist, warm, no rashes Neurological: no tremor with outstretched hands, DTR normal in all 4  Assessment: 1. Hypercalcemia/hyperparathyroidism -+ History of complications from hypercalcemia: Nephrolithiasis, osteoporosis, no fractures.   2. Vit D insufficiency  3. Hypothyroidism  4.  Osteoporosis  Fax results to Dr. Ophelia Shoulder (fax [phone]) and Dr. Candiss Norse.   Plan: 1. And 2.  Patient with a history of elevated calcium (highest 11.1) and also very high intact PTH, at 256 (corresponding to a calcium level of 10.8). A 24-hour urine calcium was normal, as was her vitamin D.  She brought records from PCP which calcium and PTH were both high.  -At last visit, biochemical investigation pointed towards primary hyperparathyroidism.  Calcium was at  the upper limit of normal then, but this was likely due to Prolia -She met criteria for parathyroid surgery (kidney disease, osteoporosis, nephrolithiasis) -She had right inferior parathyroidectomy  with Dr. Harlow Asa on 07/18/2019 -She feels good after the surgery, without complaints.  Cervical scar is healed. -Reviewed PTH and calcium.  At the calcium was normal while PTH was slightly above the upper limit of normal, but much improved -We will repeat them today, however, she does have worsening CKD (latest GFR was 20) and this may  increase the results. -She continues on 4000 units vitamin D daily.   -We will recheck her vitamin D level today - I will see the patient back in 1 year  3. Hypothyroidism -Reportedly diagnosed before starting amiodarone - latest thyroid labs reviewed with pt >> normal: Lab Results  Component Value Date   TSH 3.47 09/12/2019   - she continues on LT4 75 mcg 6 out of 7 days - pt feels good on this dose. - we discussed about taking the thyroid hormone every day, with water, >30 minutes before breakfast, separated by >4 hours from acid reflux medications, calcium, iron, multivitamins. Pt. is taking it correctly. - will check thyroid tests today: TSH and fT4 - If labs are abnormal, she will need to return for repeat TFTs in 1.5 months  4.  Osteoporosis -She denies fractures and falls -Continues Fosamax 70 mg weekly. -Most likely improved after her parathyroidectomy in 07/2019 -She is due for another bone density scan this year -Of note, she has CKD, latest GFR was 20  For results, call daughter Cecille Rubin: [phone]  Component     Latest Ref Rng & Units 09/17/2020  Glucose     65 - 99 mg/dL 75  BUN     7 - 25 mg/dL 30 (H)  Creatinine     0.60 - 0.93 mg/dL 2.16 (H)  GFR, Est Non African American     > OR = 60 mL/min/1.80m 21 (L)  GFR, Est African American     > OR = 60 mL/min/1.759m24 (L)  BUN/Creatinine Ratio     6 - 22 (calc) 14  Sodium     135 - 146 mmol/L 141  Potassium     3.5 - 5.3 mmol/L 4.1  Chloride     98 - 110 mmol/L 105  CO2     20 - 32 mmol/L 24  Calcium     8.6 - 10.4 mg/dL 9.9  PTH, Intact     15 - 65 pg/mL 36  TSH      0.35 - 4.50 uIU/mL 6.24 (H)  T4,Free(Direct)     0.60 - 1.60 ng/dL 1.34  Vitamin D, 25-Hydroxy     30.0 - 100.0 ng/mL 57.1   TSH high >> will increase the LT4 dose to 75 mcg daily.  Will need a repeat TSH in 1.5 months. Vitamin D normal. Calcium and PTH normal. GFR is lower.  She is seeing nephrology.  CrPhilemon KingdomMD PhD [facility]

## 2020-09-18 LAB — BASIC METABOLIC PANEL WITH GFR
BUN/Creatinine Ratio: 14 (calc) (ref 6–22)
BUN: 30 mg/dL — ABNORMAL HIGH (ref 7–25)
CO2: 24 mmol/L (ref 20–32)
Calcium: 9.9 mg/dL (ref 8.6–10.4)
Chloride: 105 mmol/L (ref 98–110)
Creat: 2.16 mg/dL — ABNORMAL HIGH (ref 0.60–0.93)
GFR, Est African American: 24 mL/min/{1.73_m2} — ABNORMAL LOW (ref >=60)
GFR, Est Non African American: 21 mL/min/{1.73_m2} — ABNORMAL LOW (ref >=60)
Glucose, Bld: 75 mg/dL (ref 65–99)
Potassium: 4.1 mmol/L (ref 3.5–5.3)
Sodium: 141 mmol/L (ref 135–146)

## 2020-09-18 LAB — PARATHYROID HORMONE, INTACT (NO CA): PTH: 36 pg/mL (ref 15–65)

## 2020-09-18 LAB — VITAMIN D 25 HYDROXY (VIT D DEFICIENCY, FRACTURES): Vit D, 25-Hydroxy: 57.1 ng/mL (ref 30.0–100.0)

## 2020-09-18 MED ORDER — LEVOTHYROXINE SODIUM 75 MCG PO TABS: 75.0000 ug | ORAL_TABLET | Freq: Every day | ORAL | 3 refills | Status: DC

## 2020-09-26 ENCOUNTER — Telehealth: Payer: Self-pay | Admitting: Internal Medicine

## 2020-09-26 NOTE — Telephone Encounter (Signed)
Patient's husband Elenore Rota requests to be called at ph# [phone] re: Patient received a phone call from our office stating that after reviewing patient's lab results a RX with an increased dosage for Levothyroxine (Synthroid) would be sent to the [facility], however, Elenore Rota checked the dosage on patient's old medication bottle for Levothyroxine and it states 75 mcg (patient has been on this dosage for quite a while). Elenore Rota states that the following PHARM has not received a RX and Elenore Rota wants to make sure of the dosage increase for the medication listed above: [place], [place] Phone:  [phone]  Fax:  [phone]

## 2020-09-27 NOTE — Telephone Encounter (Signed)
Pt confused with the thyroid medication which current taken 75 mcg. Do we need to increase the dosage? Please advise

## 2020-09-27 NOTE — Telephone Encounter (Signed)
LVM--to call the office regarding medication 

## 2020-09-27 NOTE — Telephone Encounter (Signed)
Teresa Caldwell, Can you please clarify with them:  My understanding was that she was taking the 75 mcg of levothyroxine only 6 out of 7 days and that is why I recommended to take it daily.  However, if she is already taking this every day, we need to increase to 88 mcg of levothyroxine.  In this case, can you please send in this new prescription?

## 2020-10-03 NOTE — Telephone Encounter (Signed)
Called patient, LVM to call back to discuss.

## 2020-10-08 NOTE — Telephone Encounter (Signed)
Called patient again, LVM to call back to discuss.   Left call back number- will remove from pool.

## 2020-12-03 ENCOUNTER — Encounter (INDEPENDENT_AMBULATORY_CARE_PROVIDER_SITE_OTHER): Payer: Self-pay | Admitting: Primary Care

## 2020-12-03 ENCOUNTER — Other Ambulatory Visit: Payer: Self-pay

## 2020-12-03 ENCOUNTER — Ambulatory Visit: Payer: Medicare Other | Attending: Primary Care | Admitting: Primary Care

## 2020-12-03 VITALS — BP 133/81 | HR 81 | Temp 97.3°F | Resp 16 | Ht 64.0 in | Wt 181.8 lb

## 2020-12-03 DIAGNOSIS — Z20822 Contact with and (suspected) exposure to covid-19: Secondary | ICD-10-CM | POA: Insufficient documentation

## 2020-12-03 DIAGNOSIS — J029 Acute pharyngitis, unspecified: Secondary | ICD-10-CM | POA: Insufficient documentation

## 2020-12-03 DIAGNOSIS — R059 Cough, unspecified: Secondary | ICD-10-CM

## 2020-12-03 LAB — POCT RAPID COVID (SOFIA) (AMB ONLY): COVID-19 AG: NEGATIVE

## 2020-12-03 MED ORDER — BENZONATATE 200 MG CAPSULE
200.00 mg | ORAL_CAPSULE | Freq: Three times a day (TID) | ORAL | 0 refills | Status: AC | PRN
Start: 2020-12-03 — End: ?

## 2020-12-03 MED ORDER — AMOXICILLIN 875 MG-POTASSIUM CLAVULANATE 125 MG TABLET
1.00 | ORAL_TABLET | Freq: Two times a day (BID) | ORAL | 0 refills | Status: AC
Start: 2020-12-03 — End: 2020-12-10

## 2020-12-03 NOTE — Nursing Note (Signed)
Travel Screening     Question   Response    In the last month, have you been in contact with someone who was confirmed or suspected to have Coronavirus / COVID-19?  No / Unsure    Have you had a COVID-19 viral test in the last 14 days?  No    Do you have any of the following new or worsening symptoms?      Have you traveled internationally or domestically in the last month?  No      Travel History   Travel since 11/02/20     No documented travel since 11/02/20        Morton Amy, LPN  0/08/9067, 93:40

## 2020-12-03 NOTE — Patient Instructions (Signed)
Bronchitis, Antibiotic Treatment (Adult)    Bronchitis is an infection of the air passages (bronchial tubes) in your lungs. It often occurs when you have a cold. This illness is contagious during the first few days and is spread through the air by coughing and sneezing, or by direct contact (touching the sick person and then touching your own eyes, nose, or mouth).  Symptoms of bronchitis include cough with mucus (phlegm) and low-grade fever. Bronchitis usually lasts 7 to 14 days. Mild cases can be treated with simple home remedies. More severe infection is treated with an antibiotic.  Home care  Follow these guidelines when caring for yourself at home:   If your symptoms are severe, rest at home for the first 2 to 3 days. When you go back to your usual activities, don't let yourself get too tired.   Don't smoke. Also stay away from secondhand smoke.   You may use over-the-counter medicines to control fever or pain, unless another medicine was prescribed. If you have chronic liver or kidney disease or have ever had a stomach ulcer or gastrointestinal bleeding, talk with your healthcare provider before using these medicines. Also talk to your provider if you are taking medicine to prevent blood clots. Aspirin should never be given to anyone younger than 18 who is ill with a viral infection or fever. It may cause severe liver or brain damage.   Your appetite may be low, so a light diet is fine. Stay well hydrated by drinking 6 to 8 glasses of fluids per day. This includes water, soft drinks, sports drinks, juices, tea, or soup. Extra fluids will help loosen mucus in your nose and lungs.   Over-the-counter cough, cold, and sore-throat medicines will not shorten the length of the illness, but they may be helpful to reduce your symptoms. Don't use decongestants if you have high blood pressure.   Finish all antibiotic medicine. Do this even if you are feeling better after only a few days.  Follow-up care  Follow  up with your healthcare provider, or as advised. If you had an X-ray or ECG (electrocardiogram), a specialist will review it. You will be told of any new test results that may affect your care.  If you are age 80 or older, if you smoke, or if you have a chronic lung disease or condition that affects your immune system, askyour healthcare provider about getting apneumococcal vaccine and a yearly flu shot (influenza vaccine).  When to seek medical advice  Call your healthcare provider right away if any of these occur:   Fever of 100.4F (38C) or higher, or as directed by your healthcare provider   Coughing up more sputum   Weakness, drowsiness, headache, facial pain, ear pain, or a stiff neck  Call 911  Call 911 if any of these occur.   Coughing up blood   Weakness, drowsiness, headache, or stiff neck that get worse   Trouble breathing, wheezing, or pain with breathing  StayWell last reviewed this educational content on 05/01/2017   2000-2021 [employer]. All rights reserved. This information is not intended as a substitute for professional medical care. Always follow your healthcare professional's instructions.

## 2020-12-03 NOTE — Progress Notes (Signed)
URGENT CARE, [facility]  [address]  [place] [address]  Phone: [phone]  Fax: [phone]    Encounter Date: 12/03/2020    Patient ID:  Cynthia Barrera  SJG:[id number]    DOB: 1941-11-03  Age: 80 y.o. female    Subjective:     Chief Complaint   Patient presents with   . Cough     c/o non-productive cough x4 days. Pt reports being fully vaccinated against Covid-19 with booster. No Flu vaccine.   . Sore Throat     c/o sore throat today.     Patient is here today with her husband for above complaints. Denies fever. Denies vomiting. States she had some abdominal cramping and loose stool, but attributes it to what she ate over Christmas. Denies any known exposure to Covid-19. Voiding normally. She coughed up sputum x 2 today, unsure of color.        Current Outpatient Medications   Medication Sig   . Alendronate (FOSAMAX) 35 mg Oral Tablet Take 1 Tab (35 mg total) by mouth Every 7 days   . amiodarone (PACERONE) 100 mg Oral Tablet Take 100 mg by mouth Once a day   . amLODIPine (NORVASC) 10 mg Oral Tablet Take 1 Tab (10 mg total) by mouth Once a day   . amoxicillin-pot clavulanate (AUGMENTIN) 875-125 mg Oral Tablet Take 1 Tablet by mouth Twice daily for 7 days   . apixaban (ELIQUIS) 5 mg Oral Tablet Take 5 mg by mouth Twice daily   . Benzonatate (TESSALON) 200 mg Oral Capsule Take 1 Capsule (200 mg total) by mouth Three times a day as needed for Cough   . cholecalciferol, vitamin D3, 1,000 unit Oral Tablet Take 2,000 Units by mouth Twice daily    . cyanocobalamin (VITAMIN B 12) 1,000 mcg Oral Tablet Take 1,000 mcg by mouth Once a day   . HYDROcodone-acetaminophen (NORCO) 5-325 mg Oral Tablet Take 1 Tablet by mouth Every night as needed for Pain   . levothyroxine (SYNTHROID) 75 mcg Oral Tablet Take 1 Tab (75 mcg total) by mouth Every morning   . lisinopriL (PRINIVIL) 40 mg Oral Tablet TAKE ONE TABLET BY MOUTH ONCE DAILY   . polysaccharide iron complex (FERREX 150) 150 mg iron Oral Capsule Take 1  capsule by mouth once daily   . traZODone (DESYREL) 50 mg Oral Tablet Take 1 Tab (50 mg total) by mouth Every night     Allergies   Allergen Reactions   . Bactrim [Sulfamethoxazole-Trimethoprim]    . Ciprofloxacin    . Digoxin    . Plaquenil [Hydroxychloroquine]    . Simvastatin      Past Medical History:   Diagnosis Date   . Atrial fibrillation (CMS HCC)    . Hypertension    . Kidney stones    . Thyroid disease          Past Surgical History:   Procedure Laterality Date   . HX CYST INCISION AND DRAINAGE      about 2000   . HX PARATHYROIDECTOMY     . HX PARTIAL HYSTERECTOMY     . HX PARTIAL THYROIDECTOMY      parathyroid right lube   . HX TONSIL AND ADENOIDECTOMY     . HX TUBAL LIGATION     . HX WRIST FRACTURE TX Left     about 2004          Family Medical History:     Problem Relation (Age  of Onset)    Diabetes Daughter    Thyroid Disease Sister, Brother          Social History     Tobacco Use   . Smoking status: Never Smoker   . Smokeless tobacco: Never Used   Vaping Use   . Vaping Use: Never used   Substance Use Topics   . Alcohol use: No     Alcohol/week: 0.0 standard drinks   . Drug use: No       Review of Systems   HENT: Positive for sore throat.    Respiratory: Positive for cough.    All other systems reviewed and are negative.    Objective:   Vitals: BP 133/81 (Site: Right, Patient Position: Sitting, Cuff Size: Adult Large)   Pulse 81   Temp 36.3 C (97.3 F)   Resp 16   Ht 1.626 m (5\' 4" )   Wt 82.5 kg (181 lb 12.8 oz)   SpO2 95%   BMI 31.21 kg/m         Physical Exam  Vitals and nursing note reviewed.   Constitutional:       General: She is not in acute distress.     Appearance: Normal appearance. She is not ill-appearing or diaphoretic.   HENT:      Head: Normocephalic.      Nose: Nose normal.      Mouth/Throat:      Lips: Pink.      Mouth: Mucous membranes are moist.      Pharynx: Uvula midline. No oropharyngeal exudate or posterior oropharyngeal erythema.      Comments: +PND  Eyes:      General:          Right eye: No discharge.         Left eye: No discharge.      Extraocular Movements: Extraocular movements intact.      Conjunctiva/sclera: Conjunctivae normal.      Pupils: Pupils are equal, round, and reactive to light.   Cardiovascular:      Rate and Rhythm: Normal rate and regular rhythm.      Pulses: Normal pulses.      Heart sounds: Normal heart sounds.   Pulmonary:      Effort: Pulmonary effort is normal. No respiratory distress.      Breath sounds: Normal breath sounds. No stridor. No wheezing, rhonchi or rales.   Chest:      Chest wall: No tenderness.   Abdominal:      General: Bowel sounds are normal. There is no distension.      Palpations: Abdomen is soft.      Tenderness: There is no abdominal tenderness. There is no guarding.   Musculoskeletal:         General: Normal range of motion.      Cervical back: Normal range of motion and neck supple.   Skin:     General: Skin is warm and dry.      Capillary Refill: Capillary refill takes less than 2 seconds.   Neurological:      Mental Status: She is alert and oriented to person, place, and time.   Psychiatric:         Behavior: Behavior normal.         Assessment & Plan:     ENCOUNTER DIAGNOSES     ICD-10-CM   1. Cough  R05.9   2. Sore throat  J02.9       Orders Placed This  Encounter   . POCT Rapid Covid ([facility]) (AMB Only)   . amoxicillin-pot clavulanate (AUGMENTIN) 875-125 mg Oral Tablet   . Benzonatate (TESSALON) 200 mg Oral Capsule     Rapid Covid-19 test result - Negative    Patient Instructions     Bronchitis, Antibiotic Treatment (Adult)    Bronchitis is an infection of the air passages (bronchial tubes) in your lungs. It often occurs when you have a cold. This illness is contagious during the first few days and is spread through the air by coughing and sneezing, or by direct contact (touching the sick person and then touching your own eyes, nose, or mouth).  Symptoms of bronchitis include cough with mucus (phlegm) and low-grade fever.  Bronchitis usually lasts 7 to 14 days. Mild cases can be treated with simple home remedies. More severe infection is treated with an antibiotic.  Home care  Follow these guidelines when caring for yourself at home:   If your symptoms are severe, rest at home for the first 2 to 3 days. When you go back to your usual activities, don't let yourself get too tired.   Don't smoke. Also stay away from secondhand smoke.   You may use over-the-counter medicines to control fever or pain, unless another medicine was prescribed. If you have chronic liver or kidney disease or have ever had a stomach ulcer or gastrointestinal bleeding, talk with your healthcare provider before using these medicines. Also talk to your provider if you are taking medicine to prevent blood clots. Aspirin should never be given to anyone younger than 59 who is ill with a viral infection or fever. It may cause severe liver or brain damage.   Your appetite may be low, so a light diet is fine. Stay well hydrated by drinking 6 to 8 glasses of fluids per day. This includes water, soft drinks, sports drinks, juices, tea, or soup. Extra fluids will help loosen mucus in your nose and lungs.   Over-the-counter cough, cold, and sore-throat medicines will not shorten the length of the illness, but they may be helpful to reduce your symptoms. Don't use decongestants if you have high blood pressure.   Finish all antibiotic medicine. Do this even if you are feeling better after only a few days.  Follow-up care  Follow up with your healthcare provider, or as advised. If you had an X-ray or ECG (electrocardiogram), a specialist will review it. You will be told of any new test results that may affect your care.  If you are age 90 or older, if you smoke, or if you have a chronic lung disease or condition that affects your immune system, askyour healthcare provider about getting apneumococcal vaccine and a yearly flu shot (influenza vaccine).  When to seek  medical advice  Call your healthcare provider right away if any of these occur:   Fever of 100.79F (38C) or higher, or as directed by your healthcare provider   Coughing up more sputum   Weakness, drowsiness, headache, facial pain, ear pain, or a stiff neck  Call 911  Call 911 if any of these occur.   Coughing up blood   Weakness, drowsiness, headache, or stiff neck that get worse   Trouble breathing, wheezing, or pain with breathing  StayWell last reviewed this educational content on 05/01/2017   2000-2021 [employer]. All rights reserved. This information is not intended as a substitute for professional medical care. Always follow your healthcare professional's instructions.  Return if symptoms worsen or fail to improve.    Blima Dessert, NP

## 2021-01-28 ENCOUNTER — Ambulatory Visit: Payer: Medicare Other | Attending: Primary Care | Admitting: Primary Care

## 2021-01-28 ENCOUNTER — Other Ambulatory Visit: Payer: Self-pay

## 2021-01-28 ENCOUNTER — Encounter (INDEPENDENT_AMBULATORY_CARE_PROVIDER_SITE_OTHER): Payer: Self-pay | Admitting: Primary Care

## 2021-01-28 VITALS — BP 165/81 | HR 86 | Temp 97.4°F | Resp 17 | Ht 64.0 in | Wt 182.0 lb

## 2021-01-28 DIAGNOSIS — H6122 Impacted cerumen, left ear: Secondary | ICD-10-CM | POA: Insufficient documentation

## 2021-01-28 NOTE — Progress Notes (Signed)
URGENT CARE, [facility]  [address]  [name] [address]  Phone: [phone]  Fax: [phone]    Encounter Date: 01/28/2021    Patient ID:  Cynthia Barrera  [id number]    DOB: 28-Oct-1941  Age: 80 y.o. female    Subjective:     Chief Complaint   Patient presents with    Wax in Ear     Patients PCP told pt on 01/25/21 to use an ear drop to soften the cerumen build up in her left ear so it could be flushed. Pt spouse states pt used drop for two days and now can't hear at all out of left hear.     Patient is here today with her husband for above complaint. Denies any fever or ear pain.         Current Outpatient Medications   Medication Sig    Alendronate (FOSAMAX) 35 mg Oral Tablet Take 1 Tab (35 mg total) by mouth Every 7 days    amiodarone (PACERONE) 100 mg Oral Tablet Take 100 mg by mouth Once a day    amLODIPine (NORVASC) 10 mg Oral Tablet Take 1 Tab (10 mg total) by mouth Once a day    apixaban (ELIQUIS) 5 mg Oral Tablet Take 5 mg by mouth Twice daily    Benzonatate (TESSALON) 200 mg Oral Capsule Take 1 Capsule (200 mg total) by mouth Three times a day as needed for Cough    cholecalciferol, vitamin D3, 1,000 unit Oral Tablet Take 2,000 Units by mouth Twice daily     cyanocobalamin (VITAMIN B 12) 1,000 mcg Oral Tablet Take 1,000 mcg by mouth Once a day    HYDROcodone-acetaminophen (NORCO) 5-325 mg Oral Tablet Take 1 Tablet by mouth Every night as needed for Pain    levothyroxine (SYNTHROID) 75 mcg Oral Tablet Take 1 Tab (75 mcg total) by mouth Every morning    lisinopriL (PRINIVIL) 40 mg Oral Tablet TAKE ONE TABLET BY MOUTH ONCE DAILY    polysaccharide iron complex (FERREX 150) 150 mg iron Oral Capsule Take 1 capsule by mouth once daily    traZODone (DESYREL) 50 mg Oral Tablet Take 1 Tab (50 mg total) by mouth Every night     Allergies   Allergen Reactions    Bactrim [Sulfamethoxazole-Trimethoprim]     Ciprofloxacin     Digoxin     Plaquenil [Hydroxychloroquine]      Simvastatin      Past Medical History:   Diagnosis Date    Atrial fibrillation (CMS HCC)     Hypertension     Kidney stones     Thyroid disease          Past Surgical History:   Procedure Laterality Date    HX CYST INCISION AND DRAINAGE      about 2000    HX PARATHYROIDECTOMY      HX PARTIAL HYSTERECTOMY      HX PARTIAL THYROIDECTOMY      parathyroid right lube    HX TONSIL AND ADENOIDECTOMY      HX TUBAL LIGATION      HX WRIST FRACTURE TX Left     about 2004          Family Medical History:     Problem Relation (Age of Onset)    Diabetes Daughter    Thyroid Disease Sister, Brother          Social History     Tobacco Use    Smoking status:  Never Smoker    Smokeless tobacco: Never Used   Vaping Use    Vaping Use: Never used   Substance Use Topics    Alcohol use: No     Alcohol/week: 0.0 standard drinks    Drug use: No       Review of Systems   All other systems reviewed and are negative.    Objective:   Vitals: BP (!) 165/81 (Site: Left, Patient Position: Sitting, Cuff Size: Adult)    Pulse 86    Temp 36.3 C (97.4 F)    Resp 17    Ht 1.626 m ('5\' 4"'$ )    Wt 82.6 kg (182 lb)    SpO2 95%    BMI 31.24 kg/m         Physical Exam  Vitals and nursing note reviewed.   HENT:      Right Ear: Tympanic membrane normal.      Left Ear: There is impacted cerumen.   Cardiovascular:      Rate and Rhythm: Normal rate and regular rhythm.      Heart sounds: Normal heart sounds.   Pulmonary:      Effort: Pulmonary effort is normal.      Breath sounds: Normal breath sounds.   Skin:     General: Skin is warm and dry.   Neurological:      Gait: Gait normal.         Assessment & Plan:     ENCOUNTER DIAGNOSES     ICD-10-CM   1. Impacted cerumen of left ear  H61.22       Orders Placed This Encounter    [phone] - REMOVAL IMPACTED CERUMEN W/ INSTRUMENT, UNILATERAL (AMB ONLY-PD)     Attempted to flush left ear with warm water/peroxide solution. No cerumen obtained.  Advised to use OTC sweet oil drops x 1 week and return for ear  cleaning.    Patient Instructions       Impacted Earwax     Inner ear structures including ear canal and eardrum.   Impacted earwax is a buildup of the natural wax in the ear. Impacted earwax is very common. It can cause symptoms such as hearing loss. It can also make it hard for a healthcare provider to check your ear.   Understanding earwax  Tiny glands in your ear make substances that combine with dead skin cells to form earwax. Earwax helps protect your ear canal from water, dirt, infection, and injury. Over time, earwax travels from the inner part of your ear canal to the entrance of the canal. Then it falls away naturally. But in some cases, it cant travel to the entrance of the canal. This may be because of a health condition or objects put in the ear. With age, earwax tends to become harder and less fluid. Older adults are more likely to have problems with earwax buildup.   What causes impacted earwax?  Earwax can build up because of many health conditions. Some cause a physical blockage. Others cause too much earwax to be made. Health conditions that can cause earwax buildup include:    Bony blockage in the ear (osteoma or exostoses)   Infections, such as an outer ear infection (external otitis)   Skin disease, such as eczema   Autoimmune diseases, such as lupus   A narrowed ear canal from birth, chronic inflammation, or injury   Too much earwax because of injury   Too much earwax because of water in the  ear canal  Putting objects in the ear again and again can also cause impacted earwax. For example, putting cotton swabs in the ear may push the wax deeper into the ear. Over time, this may cause blockage. Hearing aids, swimming plugs, and swim molds can also cause this problem when used again and again.   In some cases, the cause of impacted earwax is not known.  Symptoms of impacted earwax  Excess earwax often does not cause any symptoms, unless there is a large amount of buildup. Then it may  cause symptoms such as:    Hearing loss   Earache   Sense of ear fullness   Itching in the ear   Odor from the ear   Ear drainage   Dizziness   Ringing in the ears   Cough  Treatment for impacted earwax  If you dont have symptoms, you may not need treatment. Often the earwax goes away on its own with time. If you have symptoms, you may have 1 or more treatments such as:    Ear drops to soften the earwax. This helps it leave the ear over time.   Rinsing the ear canal with water. This is done in a healthcare providers office.   Removing the earwax with small tools. This is also done in a providers office.  In rare cases, some treatments for earwax removal may cause complications such as:   Outer ear infection   Earache   Short-term hearing loss   Dizziness   Water trapped in the ear canal   Hole in the eardrum   Ringing in the ears   Bleeding from the ear  Talk with your healthcare provider about which risks apply most to you.  Healthcare providers don't advise using ear candles or ear vacuum kits. These methods are not shown to work and may cause problems.   Preventing impacted earwax  You may not be able to prevent impacted earwax if you have a health condition that causes it, such as eczema. In other cases, you may be able to prevent earwax buildup by:    Using ear drops once a week   Having a regular ear cleaning about every 6 months   Not using cotton swabs in the ear  When to call the healthcare provider  Call your healthcare provider right away if you have:    Symptoms of impacted earwax   Severe symptoms after earwax removal, such as bleeding or severe ear pain    StayWell last reviewed this educational content on 08/01/2018   2000-2021 [employer]. All rights reserved. This information is not intended as a substitute for professional medical care. Always follow your healthcare professional's instructions.              No follow-ups on file.    Blima Dessert, NP

## 2021-01-28 NOTE — Nursing Note (Signed)
Travel Screening     Question   Response    In the last 10 days, have you been in contact with someone who was confirmed or suspected to have Coronavirus/COVID-19?  No / Unsure    Have you had a COVID-19 viral test in the last 10 days?  No    Do you have any of the following new or worsening symptoms?      Have you traveled internationally or domestically in the last month?  No      Travel History   Travel since 12/28/20    No documented travel since 12/28/20       Morton Amy, LPN  [id number], [id number]

## 2021-02-11 NOTE — Patient Instructions (Signed)
Impacted Earwax     Inner ear structures including ear canal and eardrum.     Impacted earwax is a buildup of the natural wax in the ear. Impacted earwax is very common. It can cause symptoms such as hearing loss. It can also make it hard for a healthcare provider to check your ear.   Understanding earwax  Tiny glands in your ear make substances that combine with dead skin cells to form earwax. Earwax helps protect your ear canal from water, dirt, infection, and injury. Over time, earwax travels from the inner part of your ear canal to the entrance of the canal. Then it falls away naturally. But in some cases, it can’t travel to the entrance of the canal. This may be because of a health condition or objects put in the ear. With age, earwax tends to become harder and less fluid. Older adults are more likely to have problems with earwax buildup.   What causes impacted earwax?  Earwax can build up because of many health conditions. Some cause a physical blockage. Others cause too much earwax to be made. Health conditions that can cause earwax buildup include:   · Bony blockage in the ear (osteoma or exostoses)  · Infections, such as an outer ear infection (external otitis)  · Skin disease, such as eczema  · Autoimmune diseases, such as lupus  · A narrowed ear canal from birth, chronic inflammation, or injury  · Too much earwax because of injury  · Too much earwax because of  water in the ear canal  Putting objects in the ear again and again can also cause impacted earwax. For example, putting cotton swabs in the ear may push the wax deeper into the ear. Over time, this may cause blockage. Hearing aids, swimming plugs, and swim molds can also cause this problem when used again and again.   In some cases, the cause of impacted earwax is not known.  Symptoms of impacted earwax  Excess earwax often does not cause any symptoms, unless there is a large amount of buildup. Then it may cause symptoms such as:   · Hearing  loss  · Earache  · Sense of ear fullness  · Itching in the ear  · Odor from the ear  · Ear drainage  · Dizziness  · Ringing in the ears  · Cough  Treatment for impacted earwax  If you don’t have symptoms, you may not need treatment. Often the earwax goes away on its own with time. If you have symptoms, you may have 1 or more treatments such as:   · Ear drops to soften the earwax. This helps it leave the ear over time.  · Rinsing the ear canal with water. This is done in a healthcare provider’s office.  · Removing the earwax with small tools. This is also done in a provider’s office.  In rare cases, some treatments for earwax removal may cause complications such as:  · Outer ear infection  · Earache  · Short-term hearing loss  · Dizziness  · Water trapped in the ear canal  · Hole in the eardrum  · Ringing in the ears  · Bleeding from the ear  Talk with your healthcare provider about which risks apply most to you.  Healthcare providers don't advise using ear candles or ear vacuum kits. These methods are not shown to work and may cause problems.   Preventing impacted earwax  You may not be able to prevent impacted earwax if   you have a health condition that causes it, such as eczema. In other cases, you may be able to prevent earwax buildup by:   · Using ear drops once a week  · Having a regular ear cleaning about every 6 months  · Not using cotton swabs in the ear  When to call the healthcare provider  Call your healthcare provider right away if you have:   · Symptoms of impacted earwax  · Severe symptoms after earwax removal, such as bleeding or severe ear pain    [employer] last reviewed this educational content on 08/01/2018    © 2000-2021 [employer]. All rights reserved. This information is not intended as a substitute for professional medical care. Always follow your healthcare professional's instructions.

## 2021-04-06 ENCOUNTER — Other Ambulatory Visit (INDEPENDENT_AMBULATORY_CARE_PROVIDER_SITE_OTHER): Payer: Self-pay | Admitting: Family Medicine

## 2021-04-06 DIAGNOSIS — M810 Age-related osteoporosis without current pathological fracture: Secondary | ICD-10-CM

## 2021-09-23 ENCOUNTER — Encounter: Payer: Self-pay | Admitting: Internal Medicine

## 2021-09-23 ENCOUNTER — Other Ambulatory Visit: Payer: Self-pay

## 2021-09-23 ENCOUNTER — Ambulatory Visit (INDEPENDENT_AMBULATORY_CARE_PROVIDER_SITE_OTHER): Payer: Medicare Other | Admitting: Internal Medicine

## 2021-09-23 VITALS — BP 120/78 | HR 78 | Ht 63.0 in | Wt 173.6 lb

## 2021-09-23 DIAGNOSIS — E210 Primary hyperparathyroidism: Secondary | ICD-10-CM

## 2021-09-23 DIAGNOSIS — E039 Hypothyroidism, unspecified: Secondary | ICD-10-CM

## 2021-09-23 DIAGNOSIS — M810 Age-related osteoporosis without current pathological fracture: Secondary | ICD-10-CM

## 2021-09-23 DIAGNOSIS — E559 Vitamin D deficiency, unspecified: Secondary | ICD-10-CM | POA: Diagnosis not present

## 2021-09-23 DIAGNOSIS — Z23 Encounter for immunization: Secondary | ICD-10-CM | POA: Diagnosis not present

## 2021-09-23 LAB — VITAMIN D 25 HYDROXY (VIT D DEFICIENCY, FRACTURES): VITD: 98.42 ng/mL (ref 30.00–100.00)

## 2021-09-23 LAB — TSH: TSH: 5.74 u[IU]/mL — ABNORMAL HIGH (ref 0.35–5.50)

## 2021-09-23 LAB — T4, FREE: Free T4: 1.3 ng/dL (ref 0.60–1.60)

## 2021-09-23 MED ORDER — LEVOTHYROXINE SODIUM 88 MCG PO TABS: 88.0000 ug | ORAL_TABLET | Freq: Every day | ORAL | 3 refills | Status: DC

## 2021-09-23 NOTE — Progress Notes (Addendum)
FLUS A.  Patient ID: Teresa Caldwell, female   DOB: 1941-04-09, 80 y.o.   MRN: [id number]   This visit occurred during the SARS-CoV-2 public health emergency.  Safety protocols were in place, including screening questions prior to the visit, additional usage of staff PPE, and extensive cleaning of exam room while observing appropriate contact time as indicated for disinfecting solutions.   HPI  Teresa Caldwell is a 80 y.o.-year-old female, initially referred by her cardiologist, Dr. Haroldine Laws (previously saw Dr. 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 She was seeing endocrinology in [place], [place], Dr. Bobbe Medico, now retired.  Last visit with me 1 year ago.  She is here with her husband who offers most of the history especially related to her past medical history, symptoms, medications.  Interim history: No falls or fractures since last visit.  No kidney stones. No blurry vision or vertigo.  Hypercalcemia  -Diagnosed in 2004 - was followed by her endocrinologist in [place]. They were following the levels of the calcium and PTH.  Her calcium started to increase in 10/2016.  She had right inferior parathyroidectomy on 07/18/2019 with Dr. Harlow Asa.  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  Calcium level postop was 8.9, PTH slightly high at 74.  She felt better after surgery, with less fatigue.  Reviewed pertinent labs: 09/20/2021: Calcium 9.2 05/01/2021: Calcium 9.5, PTH 50 Lab Results  Component Value Date   PTH 36 09/17/2020   PTH 71 (H) 09/12/2019   PTH Comment 09/12/2019   PTH 118 (H) 08/27/2018   PTH 45 02/25/2018   PTH 76 (H) 04/13/2017   CALCIUM 9.9 09/17/2020   CALCIUM 9.3 09/12/2019   CALCIUM 8.4 (L) 07/19/2019   CALCIUM 9.9 07/14/2019   CALCIUM 10.4 08/27/2018   CALCIUM 9.1 02/25/2018   CALCIUM 10.4 04/13/2017   CALCIUM 11.1 (H) 01/02/2017  07/08/2018 records from PCP: Ca 10.9 (8.5-10.1), PTH 157 (18-80) 07/13/2017: PTH 210,  calcium: 10.4 (8.5-10.1) 12/22/2016: Calcium 10.8 (8.5-10.1), iCa 5.91 (4.57-5.43) PTH 256 12/17/2016: Calcium 10.4 (8.5-10.1), Mg  06/25/2015: iCa 1.43 (1.12-1.32), PTH 159  24-hour urine calcium was normal (but slightly low creatinine): Component     Latest Ref Rng & Units 05/14/2017  Calcium, Ur     Not estab mg/dL 8  Calcium, 24 hour urine     35 - 250 mg/24 h 192  Creatinine, Urine     20 - 320 mg/dL 24  Creatinine, 24H Ur     0.63 - 2.50 g/24 h 0.58 (L)   Osteoporosis:  Reviewed most recent T-scores: 07/13/2018:  L spine -2.2 (-5.7%*) L hip: -2.9 (no change)  She started Fosamax in 07/2018.  She is on 35 mg weekly.  No fractures.  She has a history of 3 kidney stones, last in 2015, for which she had lithotripsy.  + CKD she sees nephrology; last BUN/Cr: 09/20/2021: GFR 26, 31/1.92 05/01/2021: GFR 23, 35/2.11 Lab Results  Component Value Date   BUN 30 (H) 09/17/2020   BUN 31 (H) 07/19/2019   CREATININE 2.16 (H) 09/17/2020   CREATININE 1.88 (H) 07/19/2019  05/2020: GFR 31 reportedly 09/2019:  GFR 20 07/08/2018: 26/1.6 02/03/2018:  BUN/Cr 25/1.7, GFR 35 12/22/2016: 20/1.4, EGFR 37 12/17/2016: 18/1.5, EGFR 34  She stopped HCTZ in 10/2016.  Vitamin D deficiency:  Most recent vitamin D level was normal: Lab Results  Component Value Date   VD25OH 57.1 09/17/2020   VD25OH 76.82 09/12/2019   VD25OH 69.16 08/27/2018   VD25OH 59.73 02/25/2018  VD25OH 46.04 04/13/2017   VD25OH 25.93 (L) 02/06/2017  07/13/2017: 43.3 12/17/2016: Vitamin D 22.8 06/25/2015: Vitamin D 19.2 Her cardiologist added a vitamin D 2000 IU/day >> we increased the dose to 4000 units in 01/2017.  She continues on this dose now.  Pt does not have a FH of hypercalcemia, pituitary tumors. Mother and sister had osteoporosis.  Sister and daughter had thyroid cancer.  Hypothyroidism  -Apparently diagnosed before starting amiodarone.  She was able to reduce the dose of levothyroxine when her  amiodarone dose was reduced in 2018  She is now taking levothyroxine 75 mcg (1.5 to 50 mcg tablets), now daily: - in am - fasting - at least 30 min from b'fast - no Ca, MVI, PPIs, + Fe at night - not on Biotin, on B12 vitamin  Reviewed her TFTs: Lab Results  Component Value Date   TSH 6.24 (H) 09/17/2020   TSH 3.47 09/12/2019   TSH 2.05 02/25/2018   TSH 1.35 04/13/2017   TSH 3.59 02/06/2017   FREET4 1.34 09/17/2020   FREET4 1.31 09/12/2019   FREET4 1.58 02/25/2018   FREET4 1.45 04/13/2017   FREET4 1.40 02/06/2017  07/13/2017: 1.07 12/17/2016: TSH 1.69  07/13/2017: TPO Abs <28  She is on Plaquenil.  She and her husband are driving from 5 hours away to come see their daughter.  ROS: + See HPI  I reviewed pt's medications, allergies, PMH, social hx, family hx, and changes were documented in the history of present illness. Otherwise, unchanged from my initial visit note.  Patient Active Problem List   Diagnosis Date Noted   Vitamin D insufficiency 02/06/2017    Priority: 1.   Hypothyroidism 02/06/2017    Priority: 1.   Hyperparathyroidism, primary ([place]) 02/06/2017    Priority: 1.   Osteoporosis 02/06/2017    Priority: 1.   Primary hyperparathyroidism ([place]) 07/18/2019   Glucose intolerance (impaired glucose tolerance) 02/06/2017   Essential hypertension 02/06/2017   Atrial fibrillation ([place]) 02/06/2017   OSA on CPAP 02/06/2017   B12 deficiency 02/06/2017   Hyperlipidemia 02/06/2017   IDA (iron deficiency anemia) 02/06/2017   Past Surgical History:  Procedure Laterality Date   TONSILLECTOMY  1956    WRIST ARTHROSCOPY Left 1994   Also: - Hysterectomy 1989 - Lithotripsy x3 - Cataract L and R 2018   Social History   Social History   Marital status: Married    Spouse name: N/A   Number of children: 5 (1 daughter lives in [place])   Occupational History   n/a   Social History Main Topics   Smoking status: Never Smoker   Smokeless tobacco: Never  Used   Alcohol use No   Drug use: No   Current Outpatient Medications on File Prior to Visit  Medication Sig Dispense Refill   alendronate (FOSAMAX) 35 MG tablet Take 35 mg by mouth every 7 (seven) days.  3   amiodarone (PACERONE) 100 MG tablet Take 100 mg by mouth every morning.     amLODipine (NORVASC) 10 MG tablet Take 10 mg by mouth every evening.      apixaban (ELIQUIS) 5 MG TABS tablet Take 5 mg by mouth 2 (two) times daily.     Cholecalciferol (VITAMIN D-3 PO) Take 2,000 Units by mouth 2 (two) times daily.      cloNIDine (CATAPRES) 0.1 MG tablet Take 0.1 mg by mouth See admin instructions. Take 0.1 mg by mouth twice daily as needed only if systolic reading is 448 or greater (Patient not  taking: Reported on 09/17/2020)     iron polysaccharides (FERREX 150) 150 MG capsule Take 150 mg by mouth every evening.     levothyroxine (SYNTHROID) 75 MCG tablet Take 1 tablet (75 mcg total) by mouth daily before breakfast. 90 tablet 3   lisinopril (PRINIVIL,ZESTRIL) 40 MG tablet Take 20 mg by mouth every morning.      metoCLOPramide (REGLAN) 10 MG tablet Take 0.5 tablets (5 mg total) by mouth every 6 (six) hours as needed for nausea. 4 tablet 0   traZODone (DESYREL) 50 MG tablet Take 25-50 mg by mouth at bedtime as needed for sleep.     vitamin B-12 (CYANOCOBALAMIN) 1000 MCG tablet Take 1,000 mcg by mouth daily.     No current facility-administered medications on file prior to visit.   Allergies  Allergen Reactions   Bactrim [Sulfamethoxazole-Trimethoprim] Dermatitis    blisters   Ciprofloxacin Hives   Digoxin And Related Hives    And blisters (eyes & lips)   Sulfa Antibiotics Hives   Tape Other (See Comments)    Needs tape for SENSITIVE SKIN   Clindamycin/Lincomycin Rash   Family History  Problem Relation Age of Onset   Hypertension Mother    Heart disease Mother    Diabetes Child    Hypertension Child    Thyroid disease Child    Cancer Child     PE: BP 120/78 (BP Location:  Right Arm, Patient Position: Sitting, Cuff Size: Normal)   Pulse 78   Ht _0  (1.6 m)   Wt 173 lb 9.6 oz (78.7 kg)   LMP  (LMP Unknown)   SpO2 98%   BMI 30.75 kg/m  Wt Readings from Last 3 Encounters:  09/23/21 173 lb 9.6 oz (78.7 kg)  09/17/20 175 lb 12.8 oz (79.7 kg)  09/12/19 179 lb (81.2 kg)   Constitutional: overweight, in NAD Eyes: PERRLA, EOMI, no exophthalmos ENT: moist mucous membranes, no thyromegaly, no cervical lymphadenopathy Cardiovascular: RRR, No MRG Respiratory: CTA B Gastrointestinal: abdomen soft, NT, ND, BS+ Musculoskeletal: no deformities, strength intact in all 4 Skin: moist, warm, no rashes Neurological: no tremor with outstretched hands, DTR normal in all 4  Assessment: 1. Hypercalcemia/hyperparathyroidism -+ History of complications from hypercalcemia: Nephrolithiasis, osteoporosis, no fractures.   2. Vit D insufficiency  3. Hypothyroidism  4.  Osteoporosis  Fax results to Dr. Ophelia Shoulder (fax [phone]) and Dr. Candiss Norse.   Plan: 1. And 2.   Patient with a history of elevated calcium (high at 11.1) also very high intact PTH, at 256 (for a calcium level of 10.8).  24-hour urine calcium was normal, as was her vitamin D.  Biochemical investigation pointing towards primary hyperparathyroidism. -She has a history of nephrolithiasis and osteoporosis, both possible complications from hyperparathyroidism -I referred her to surgery and she had right inferior parathyroidectomy by Dr. Harlow Asa on 07/18/2019 -She felt well after surgery, without complaints.  At today's visit, surgical scar is healed without keloid. -Of note, she has CKD stage IV and her GFR is low, however, improved lately.  Her nephrologist checked a PTH 4 months ago and this was normal, as was the calcium level.  We will not repeat this today -She continues on 4000 units vitamin D daily -We will recheck her vitamin D level today - I will see the patient back in 1 year  3.  Hypothyroidism -Reportedly diagnosed before starting amiodarone - latest thyroid labs reviewed with pt. >> TSH was high: Lab Results  Component Value Date   TSH 6.24 (  H) 09/17/2020  - she continues on LT4 75 mcg daily (increased from daily to only 6 out of 7 days at last visit) - pt feels good on this dose. - we discussed about taking the thyroid hormone every day, with water, >30 minutes before breakfast, separated by >4 hours from acid reflux medications, calcium, iron, multivitamins. Pt. is taking it correctly. - will check thyroid tests today: TSH and fT4 - If labs are abnormal, she will need to return for repeat TFTs in 1.5 months  4.  Osteoporosis -no fractures and falls -continues on Fosamax 35 mg weekly (lower dose in the context of renal disease) -Possibly improved after her parathyroidectomy in 07/2019 -She is due for another bone density scan -she did not have this last year.  I advised her that this is ideally checked on the same machine and advised her to discuss with PCP to have it in the same place and send me the results.  For results, call daughter Cecille Rubin: [phone]  + flu shot today  Component     Latest Ref Rng & Units 09/23/2021  VITD     30.00 - 100.00 ng/mL 98.42  TSH     0.35 - 5.50 uIU/mL 5.74 (H)  T4,Free(Direct)     0.60 - 1.60 ng/dL 1.30  TSH is still elevated.  We will increase the levothyroxine dose to 88 mcg daily and will need to repeat the tests in 1.5 mo.  Vitamin D is close to the upper limit of normal.  We will reduce her vitamin D intake to half-2000 units daily or 4000 units every other day.  We will recheck her vitamin D level when she comes back for the thyroid tests.  Philemon Kingdom, MD PhD [facility]

## 2021-09-23 NOTE — Addendum Note (Signed)
Addended by: Philemon Kingdom on: 09/23/2021 05:07 PM   Modules accepted: Orders

## 2021-09-23 NOTE — Patient Instructions (Signed)
Please stop at the lab.  Continue vitamin D 4000 units daily.  Try to get a new Bone density scan on the same machine as before.  Continue Levothyroxine 75 mcg daily.  Take the thyroid hormone every day, with water, at least 30 minutes before breakfast, separated by at least 4 hours from: - acid reflux medications - calcium - iron - multivitamins  Please come back for a follow-up appointment in 1 year.

## 2021-09-25 ENCOUNTER — Other Ambulatory Visit: Payer: Self-pay

## 2021-09-25 DIAGNOSIS — E039 Hypothyroidism, unspecified: Secondary | ICD-10-CM

## 2021-09-25 DIAGNOSIS — E559 Vitamin D deficiency, unspecified: Secondary | ICD-10-CM

## 2021-09-25 NOTE — Addendum Note (Signed)
Addended by: Kaylyn Lim I on: 09/25/2021 08:51 AM   Modules accepted: Orders

## 2022-05-23 ENCOUNTER — Encounter (INDEPENDENT_AMBULATORY_CARE_PROVIDER_SITE_OTHER): Payer: Self-pay

## 2022-06-30 ENCOUNTER — Other Ambulatory Visit (INDEPENDENT_AMBULATORY_CARE_PROVIDER_SITE_OTHER): Payer: Self-pay | Admitting: Family Medicine

## 2022-06-30 DIAGNOSIS — Z79899 Other long term (current) drug therapy: Secondary | ICD-10-CM

## 2022-07-01 MED ORDER — AMIODARONE 100 MG TABLET
100.0000 mg | ORAL_TABLET | Freq: Every day | ORAL | 1 refills | Status: DC
Start: 2022-07-01 — End: 2022-12-30

## 2022-07-01 MED ORDER — APIXABAN 2.5 MG TABLET
2.5000 mg | ORAL_TABLET | Freq: Two times a day (BID) | ORAL | 3 refills | Status: DC
Start: 2022-07-01 — End: 2023-07-10

## 2022-07-16 ENCOUNTER — Encounter (HOSPITAL_BASED_OUTPATIENT_CLINIC_OR_DEPARTMENT_OTHER): Payer: Medicare Other | Admitting: PULMONARY DISEASE

## 2022-07-16 ENCOUNTER — Ambulatory Visit (HOSPITAL_COMMUNITY): Payer: Self-pay | Admitting: Interventional Cardiology

## 2022-07-16 DIAGNOSIS — Z79899 Other long term (current) drug therapy: Secondary | ICD-10-CM

## 2022-09-12 ENCOUNTER — Encounter (INDEPENDENT_AMBULATORY_CARE_PROVIDER_SITE_OTHER): Payer: Self-pay

## 2022-09-12 DIAGNOSIS — I493 Ventricular premature depolarization: Secondary | ICD-10-CM | POA: Insufficient documentation

## 2022-09-15 ENCOUNTER — Ambulatory Visit (INDEPENDENT_AMBULATORY_CARE_PROVIDER_SITE_OTHER): Payer: Medicare Other | Admitting: Family Medicine

## 2022-09-15 ENCOUNTER — Other Ambulatory Visit: Payer: Self-pay

## 2022-09-15 ENCOUNTER — Encounter (INDEPENDENT_AMBULATORY_CARE_PROVIDER_SITE_OTHER): Payer: Self-pay

## 2022-09-15 VITALS — BP 190/92 | HR 60 | Ht 64.0 in | Wt 166.0 lb

## 2022-09-15 DIAGNOSIS — E039 Hypothyroidism, unspecified: Secondary | ICD-10-CM

## 2022-09-15 DIAGNOSIS — I493 Ventricular premature depolarization: Secondary | ICD-10-CM

## 2022-09-15 DIAGNOSIS — I480 Paroxysmal atrial fibrillation: Secondary | ICD-10-CM

## 2022-09-15 DIAGNOSIS — I10 Essential (primary) hypertension: Secondary | ICD-10-CM

## 2022-09-15 DIAGNOSIS — I517 Cardiomegaly: Secondary | ICD-10-CM

## 2022-09-15 DIAGNOSIS — E785 Hyperlipidemia, unspecified: Secondary | ICD-10-CM

## 2022-09-15 DIAGNOSIS — N184 Chronic kidney disease, stage 4 (severe): Secondary | ICD-10-CM

## 2022-09-15 MED ORDER — CLONIDINE HCL 0.1 MG TABLET
0.1000 mg | ORAL_TABLET | Freq: Three times a day (TID) | ORAL | 3 refills | Status: DC
Start: 2022-09-15 — End: 2023-01-19

## 2022-09-15 MED ORDER — AMLODIPINE 5 MG TABLET
5.0000 mg | ORAL_TABLET | Freq: Every day | ORAL | 3 refills | Status: DC
Start: 2022-09-15 — End: 2023-01-19

## 2022-09-15 NOTE — Progress Notes (Signed)
Cardiology [place], [facility]  [address] [place] [address]  [phone]    Cardiology  Clinic Note    Name: Cynthia Barrera   DOB: 1941-06-05  [81 y.o. female]   MRN: [id number]       Visit Date: 09/15/2022   Referring: No Pcp  No address on file   PCP: Pcp Not In System         Chief Complaint: Annual Exam and Hypertension      History of Present Illness   Cynthia Barrera is an 81 y.o. female patient with a history of paroxysmal atrial fibrillation, maintained in sinus rhythm on amiodarone.  She is chronically anticoagulated with apixaban.  She has routine pulmonary function testing with her pulmonologist.  Her last test she states was a few months ago.  She also has a history of parathyroid disease with previous parathyroidectomy, she follows regularly with endocrinology.  She had a nuclear study in February 2020 that was negative for evidence of ischemia or previous infarction, with an EF of 74% on the gated study.  She presents today for her annual visit.  She reports elevated blood pressure readings recently.  She otherwise denies anginal-type chest discomfort, dyspnea, PND, orthopnea, presyncope, syncope and edema.            Allergies  Allergies   Allergen Reactions    Bactrim [Sulfamethoxazole-Trimethoprim]     Ciprofloxacin     Digoxin     Plaquenil [Hydroxychloroquine]     Simvastatin        History  Past Medical History:   Diagnosis Date    Anemia     Atrial fibrillation (CMS HCC)     CKD (chronic kidney disease)     Diverticulosis     Essential hypertension     History of nephrolithiasis     Hypertension     Kidney stones     Mixed hyperlipidemia     Paroxysmal atrial fibrillation (CMS HCC)     Rheumatoid arthritis (CMS HCC)     Sleep apnea     Thyroid disease     Ventricular ectopy          Social History     Socioeconomic History    Marital status: Married   Tobacco Use    Smoking status: Never    Smokeless tobacco: Never   Vaping  Use    Vaping Use: Never used   Substance and Sexual Activity    Alcohol use: No     Alcohol/week: 0.0 standard drinks of alcohol    Drug use: No     Family Medical History:       Problem Relation (Age of Onset)    Diabetes Daughter    Thyroid Disease Sister, Brother            Past Surgical History:   Procedure Laterality Date    HX CYST INCISION AND DRAINAGE      about 2000    HX PARATHYROIDECTOMY      HX PARTIAL HYSTERECTOMY      HX PARTIAL THYROIDECTOMY      parathyroid right lube    HX TONSIL AND ADENOIDECTOMY      HX TUBAL LIGATION      HX WRIST FRACTURE TX Left     about 2004        Medications    Current Outpatient Medications:     acetaminophen (ARTHRITIS PAIN RELIEF) 650 mg  Oral Tablet Sustained Release, Take 1 Tablet (650 mg total) by mouth Every 8 hours as needed for Pain, Disp: , Rfl:     amiodarone (PACERONE) 100 mg Oral Tablet, Take 1 Tablet (100 mg total) by mouth Once a day for 180 days, Disp: 90 Tablet, Rfl: 1    amLODIPine (NORVASC) 5 mg Oral Tablet, Take 1 Tablet (5 mg total) by mouth Once a day, Disp: 90 Tablet, Rfl: 3    apixaban (ELIQUIS) 2.5 mg Oral Tablet, Take 1 Tablet (2.5 mg total) by mouth Twice daily, Disp: 180 Tablet, Rfl: 3    cholecalciferol, vitamin D3, 1,000 unit Oral Tablet, Take 2 Tablets (2,000 Units total) by mouth Twice daily, Disp: , Rfl:     cloNIDine HCL (CATAPRES) 0.1 mg Oral Tablet, Take 1 Tablet (0.1 mg total) by mouth Three times a day, Disp: 270 Tablet, Rfl: 3    cyanocobalamin (VITAMIN B 12) 1,000 mcg Oral Tablet, Take 1 Tablet (1,000 mcg total) by mouth Once a day, Disp: , Rfl:     levothyroxine (SYNTHROID) 75 mcg Oral Tablet, Take 1 Tab (75 mcg total) by mouth Every morning, Disp: 90 Tab, Rfl: 4    lisinopriL (PRINIVIL) 40 mg Oral Tablet, TAKE ONE TABLET BY MOUTH ONCE DAILY, Disp: 90 Tablet, Rfl: 3    polysaccharide iron complex (FERREX 150) 150 mg iron Oral Capsule, Take 1 capsule by mouth once daily, Disp: 90 Capsule, Rfl: 3    Review of Systems:  Review of  Systems   Constitutional: Negative for fever.   Cardiovascular:  Negative for chest pain, dyspnea on exertion, near-syncope, orthopnea, palpitations, paroxysmal nocturnal dyspnea and syncope.   Respiratory:  Negative for hemoptysis, shortness of breath and wheezing.    Hematologic/Lymphatic: Negative for bleeding problem.   Gastrointestinal:  Negative for abdominal pain, hematemesis, hematochezia and melena.   Neurological:  Negative for dizziness and weakness.       Vital Signs:  BP (!) 190/92   Pulse 60   Ht 1.626 m (5\' 4" )   Wt 75.3 kg (166 lb)   SpO2 97%   BMI 28.49 kg/m         Physical Exam:  Physical Exam  Vitals reviewed.   Constitutional:       General: She is not in acute distress.     Appearance: Normal appearance. She is normal weight.   HENT:      Head: Normocephalic and atraumatic.   Neck:      Thyroid: No thyromegaly.      Vascular: No carotid bruit.   Cardiovascular:      Rate and Rhythm: Normal rate and regular rhythm.      Pulses: Normal pulses.      Heart sounds: Normal heart sounds, S1 normal and S2 normal. No murmur heard.  Pulmonary:      Effort: Pulmonary effort is normal. No respiratory distress.      Breath sounds: Normal breath sounds. No wheezing, rhonchi or rales.   Abdominal:      General: Bowel sounds are normal.      Palpations: Abdomen is soft.      Tenderness: There is no abdominal tenderness.   Musculoskeletal:         General: Normal range of motion.      Cervical back: Normal range of motion and neck supple.      Right lower leg: No edema.      Left lower leg: No edema.   Skin:  General: Skin is warm and dry.      Coloration: Skin is not jaundiced.   Neurological:      General: No focal deficit present.      Mental Status: She is alert and oriented to person, place, and time. Mental status is at baseline.   Psychiatric:         Mood and Affect: Mood normal.           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 Paroxysmal atrial fibrillation (CMS HCC)  I48.0 ECG - In Clinic      3. Essential hypertension  I10       4. Hyperlipidemia, unspecified hyperlipidemia type  E78.5       5. CKD (chronic kidney disease), stage IV (CMS HCC)  N18.4       6. Hypothyroidism, unspecified type  E03.9           Ms. Muela is reporting hypertension.  Her blood pressure was elevated in the office today, with similar readings at home.  We will increase clonidine from 0.1 mg b.i.d. to 0.1 mg t.i.d..  We will also add amlodipine 5 mg daily to her regimen.  She will continue to monitor her blood pressure closely at home, and let us know how it responds to the medication changes made today.  We will otherwise plan to see her back here in 1 year for re-evaluation, or sooner if necessary.    Thank you for allowing [place] to participate in the care of your patient.  Please do not hesitate to contact us with any questions or concerns.          Follow up:  Return in about 1 year (around 09/16/2023).      Halina Andreas, MD   [facility]    A portion of this documentation may have been generated using [facility] voice recognition software and may contain syntax/voice recognition errors.

## 2022-09-15 NOTE — Procedures (Signed)
[place]  [place] [address]  [facility]  Procedure Note    Name: Patrisha Hausmann MRN:  [id number]   Date: 09/15/2022 Age: 81 y.o.  DOB:   1941-06-05       ECG - In Clinic    Performed by: Halina Andreas, MD  Authorized by: Halina Andreas, MD    Documentation:      Sinus rhythm, LVH      Halina Andreas, MD

## 2022-09-16 LAB — TSH: TSH: 5.14 u[IU]/mL — ABNORMAL HIGH (ref 0.450–4.500)

## 2022-09-16 LAB — VITAMIN D 25 HYDROXY (VIT D DEFICIENCY, FRACTURES): Vit D, 25-Hydroxy: 62.7 ng/mL (ref 30.0–100.0)

## 2022-09-16 LAB — T4, FREE: Free T4: 1.96 ng/dL — ABNORMAL HIGH (ref 0.82–1.77)

## 2022-09-24 ENCOUNTER — Ambulatory Visit (INDEPENDENT_AMBULATORY_CARE_PROVIDER_SITE_OTHER): Payer: Medicare Other | Admitting: Internal Medicine

## 2022-09-24 ENCOUNTER — Encounter: Payer: Self-pay | Admitting: Internal Medicine

## 2022-09-24 VITALS — BP 140/72 | HR 63 | Ht 63.0 in | Wt 168.4 lb

## 2022-09-24 DIAGNOSIS — M810 Age-related osteoporosis without current pathological fracture: Secondary | ICD-10-CM

## 2022-09-24 DIAGNOSIS — E039 Hypothyroidism, unspecified: Secondary | ICD-10-CM

## 2022-09-24 DIAGNOSIS — E210 Primary hyperparathyroidism: Secondary | ICD-10-CM

## 2022-09-24 DIAGNOSIS — E559 Vitamin D deficiency, unspecified: Secondary | ICD-10-CM

## 2022-09-24 DIAGNOSIS — Z23 Encounter for immunization: Secondary | ICD-10-CM | POA: Diagnosis not present

## 2022-09-24 NOTE — Progress Notes (Addendum)
Patient ID: Teresa Caldwell, female   DOB: May 24, 1941, 81 y.o.   MRN: [id number]   HPI  Verline Kong is a 81 y.o.-year-old female, initially referred by her cardiologist, Dr. Haroldine Laws (previously saw Dr. 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 She was seeing endocrinology in [place], [place], Dr. Bobbe Medico, now retired.  Last visit with me 1 year ago.  She is here with her husband who offers most of the history especially related to her past medical history, symptoms, medications.  Interim history: No falls or fractures since last visit.  She still has 1 kidney stone, but this is not bothering her. She had improved BP after her parathyroid Sx, and had to reduce her BP regimen, but recently BP increased up to 200s.  Hypercalcemia  -Diagnosed in 2004 - was followed by her endocrinologist in [place]. They were following the levels of the calcium and PTH.  Her calcium started to increase in 10/2016.  She had right inferior parathyroidectomy on 07/18/2019 with Dr. Harlow Asa.  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  Calcium level postop was 8.9, PTH slightly high at 74.  She felt better after surgery, with less fatigue.  Reviewed pertinent labs: 07/23/2022 calcium 9.1 (8.7-10.3), albumin 4.2 09/20/2021: Calcium 9.2 05/01/2021: Calcium 9.5, PTH 50 Lab Results  Component Value Date   PTH 36 09/17/2020   PTH 71 (H) 09/12/2019   PTH Comment 09/12/2019   PTH 118 (H) 08/27/2018   PTH 45 02/25/2018   PTH 76 (H) 04/13/2017   CALCIUM 9.9 09/17/2020   CALCIUM 9.3 09/12/2019   CALCIUM 8.4 (L) 07/19/2019   CALCIUM 9.9 07/14/2019   CALCIUM 10.4 08/27/2018   CALCIUM 9.1 02/25/2018   CALCIUM 10.4 04/13/2017   CALCIUM 11.1 (H) 01/02/2017  07/08/2018 records from PCP: Ca 10.9 (8.5-10.1), PTH 157 (18-80) 07/13/2017: PTH 210, calcium: 10.4 (8.5-10.1) 12/22/2016: Calcium 10.8 (8.5-10.1), iCa 5.91 (4.57-5.43) PTH 256 12/17/2016: Calcium 10.4 (8.5-10.1), Mg   06/25/2015: iCa 1.43 (1.12-1.32), PTH 159  24-hour urine calcium was normal (but slightly low creatinine): Component     Latest Ref Rng & Units 05/14/2017  Calcium, Ur     Not estab mg/dL 8  Calcium, 24 hour urine     35 - 250 mg/24 h 192  Creatinine, Urine     20 - 320 mg/dL 24  Creatinine, 24H Ur     0.63 - 2.50 g/24 h 0.58 (L)   Osteoporosis:  Reviewed most recent T-scores: 10/2022 -pending: [place] in [place] 08/2020: reportedly - no records 07/13/2018:  L spine -2.2 (-5.7%*) L hip: -2.9 (no change)  She started Fosamax in 07/2018.  She is on 35 mg weekly.  No fractures.  She has a history of 3 kidney stones, last in 2015, for which she had lithotripsy.  + CKD she sees nephrology; last eval:  Ref Range & Units 07/23/2022  Glucose 70 - 99 mg/dL 97   BUN 8 - 27 mg/dL 36 High    Creatinine 0.57 - 1.00 mg/dL 2.29 High    eGFR CKD-EPI CR 2021 >59 mL/min/1.73 21 Low    BUN/Creatinine Ratio 12 - 28 16   Sodium 134 - 144 mmol/L 135   Potassium 3.5 - 5.2 mmol/L 4.5   Chloride 96 - 106 mmol/L 100   Bicarbonate (CO2) 20 - 29 mmol/L 21   Calcium 8.7 - 10.3 mg/dL 9.1   Phosphorus 3.0 - 4.3 mg/dL 3.5   Albumin 3.7 - 4.7 g/dL 4.2   Resulting Agency  [facility]   09/20/2021: GFR 26, 31/1.92 05/01/2021: GFR 23, 35/2.11 Lab Results  Component Value Date   BUN 30 (H) 09/17/2020   BUN 31 (H) 07/19/2019   CREATININE 2.16 (H) 09/17/2020   CREATININE 1.88 (H) 07/19/2019  05/2020: GFR 31 reportedly 09/2019:  GFR 20 07/08/2018: 26/1.6 02/03/2018:  BUN/Cr 25/1.7, GFR 35 12/22/2016: 20/1.4, EGFR 37 12/17/2016: 18/1.5, EGFR 34  She stopped HCTZ in 10/2016.  Vitamin D deficiency:  Most recent vitamin D level was normal: Lab Results  Component Value Date   VD25OH 62.7 09/15/2022   VD25OH 98.42 09/23/2021   VD25OH 57.1 09/17/2020   VD25OH 76.82 09/12/2019   VD25OH 69.16 08/27/2018   VD25OH 59.73 02/25/2018   VD25OH 46.04 04/13/2017   VD25OH 25.93 (L) 02/06/2017   07/13/2017: 43.3 12/17/2016: Vitamin D 22.8 06/25/2015: Vitamin D 19.2 Her cardiologist added a vitamin D 2000 IU/day >> we increased the dose to 4000 units in 01/2017. In 08/2021, I advised her to decrease the dose back to 2000 units daily.  Pt does not have a FH of hypercalcemia, pituitary tumors. Mother and sister had osteoporosis.  Sister and daughter had thyroid cancer.  Hypothyroidism  -Apparently diagnosed before starting amiodarone.  She was able to reduce the dose of levothyroxine when her amiodarone dose was reduced in 2018  She is now taking levothyroxine 88 mcg daily (increased 08/2021): - in am - fasting - at least 30 min from b'fast - no Ca, MVI, PPIs, + Fe at night - not on Biotin  Reviewed her TFTs: Lab Results  Component Value Date   TSH 5.140 (H) 09/15/2022   TSH 5.74 (H) 09/23/2021   TSH 6.24 (H) 09/17/2020   TSH 3.47 09/12/2019   TSH 2.05 02/25/2018   TSH 1.35 04/13/2017   TSH 3.59 02/06/2017   FREET4 1.96 (H) 09/15/2022   FREET4 1.30 09/23/2021   FREET4 1.34 09/17/2020   FREET4 1.31 09/12/2019   FREET4 1.58 02/25/2018   FREET4 1.45 04/13/2017   FREET4 1.40 02/06/2017  07/13/2017: 1.07 12/17/2016: TSH 1.69  07/13/2017: TPO Abs <28  She is on Plaquenil.  She and her husband are driving from 5 hours away to come see their daughter.  ROS: + See HPI  I reviewed pt's medications, allergies, PMH, social hx, family hx, and changes were documented in the history of present illness. Otherwise, unchanged from my initial visit note.  Patient Active Problem List   Diagnosis Date Noted   Vitamin D insufficiency 02/06/2017    Priority: High   Hypothyroidism 02/06/2017    Priority: High   Hyperparathyroidism, primary ([place]) 02/06/2017    Priority: High   Osteoporosis 02/06/2017    Priority: High   Primary hyperparathyroidism ([place]) 07/18/2019   Glucose intolerance (impaired glucose tolerance) 02/06/2017   Essential hypertension 02/06/2017   Atrial  fibrillation ([place]) 02/06/2017   OSA on CPAP 02/06/2017   B12 deficiency 02/06/2017   Hyperlipidemia 02/06/2017   IDA (iron deficiency anemia) 02/06/2017   Past Surgical History:  Procedure Laterality Date   TONSILLECTOMY  1956    WRIST ARTHROSCOPY Left 1994   Also: - Hysterectomy 1989 - Lithotripsy x3 - Cataract L and R 2018   Social History   Social History   Marital status: Married    Spouse name: N/A   Number of children: 5 (1 daughter lives in [place])   Occupational History   n/a   Social History Main Topics   Smoking status: Never Smoker   Smokeless tobacco: Never Used  Alcohol use No   Drug use: No   Current Outpatient Medications on File Prior to Visit  Medication Sig Dispense Refill   alendronate (FOSAMAX) 35 MG tablet Take 35 mg by mouth every 7 (seven) days.  3   amiodarone (PACERONE) 100 MG tablet Take 100 mg by mouth every morning.     amLODipine (NORVASC) 10 MG tablet Take 10 mg by mouth every evening.      Cholecalciferol (VITAMIN D-3 PO) Take 2,000 Units by mouth 2 (two) times daily.      cloNIDine (CATAPRES) 0.1 MG tablet Take 0.1 mg by mouth See admin instructions. Take 0.1 mg by mouth twice daily as needed only if systolic reading is 735 or greater     ELIQUIS 2.5 MG TABS tablet Take 2.5 mg by mouth 2 (two) times daily.     iron polysaccharides (NIFEREX) 150 MG capsule Take 150 mg by mouth every evening.     levothyroxine (SYNTHROID) 88 MCG tablet Take 1 tablet (88 mcg total) by mouth daily before breakfast. 88 tablet 3   lisinopril (PRINIVIL,ZESTRIL) 40 MG tablet Take 20 mg by mouth every morning.      metoCLOPramide (REGLAN) 10 MG tablet Take 0.5 tablets (5 mg total) by mouth every 6 (six) hours as needed for nausea. 4 tablet 0   traZODone (DESYREL) 50 MG tablet Take 25-50 mg by mouth at bedtime as needed for sleep.     vitamin B-12 (CYANOCOBALAMIN) 1000 MCG tablet Take 1,000 mcg by mouth daily.     No current facility-administered medications  on file prior to visit.   Allergies  Allergen Reactions   Bactrim [Sulfamethoxazole-Trimethoprim] Dermatitis    blisters   Ciprofloxacin Hives   Digoxin And Related Hives    And blisters (eyes & lips)   Sulfa Antibiotics Hives   Tape Other (See Comments)    Needs tape for SENSITIVE SKIN   Clindamycin/Lincomycin Rash   Family History  Problem Relation Age of Onset   Hypertension Mother    Heart disease Mother    Diabetes Child    Hypertension Child    Thyroid disease Child    Cancer Child    PE: BP (!) 140/72 (BP Location: Right Arm, Patient Position: Sitting, Cuff Size: Normal)   Pulse 63   Ht 5' 3"  (1.6 m)   Wt 168 lb 6.4 oz (76.4 kg)   LMP  (LMP Unknown)   SpO2 99%   BMI 29.83 kg/m  Wt Readings from Last 3 Encounters:  09/24/22 168 lb 6.4 oz (76.4 kg)  09/23/21 173 lb 9.6 oz (78.7 kg)  09/17/20 175 lb 12.8 oz (79.7 kg)   Constitutional: overweight, in NAD Eyes:  EOMI, no exophthalmos ENT: no neck masses, no cervical lymphadenopathy Cardiovascular: RRR, No MRG Respiratory: CTA B Musculoskeletal: no deformities Skin:no rashes Neurological: no tremor with outstretched hands  Assessment: 1. Hypercalcemia/hyperparathyroidism -+ History of complications from hypercalcemia: Nephrolithiasis, osteoporosis, no fractures.   2. Vit D insufficiency  3. Hypothyroidism  4.  Osteoporosis  Fax results to Dr. Ophelia Shoulder (fax [phone]) and Dr. Candiss Norse.   Plan: 1. And 2.   Patient with a history of elevated calcium (high at 11.1) also very high intact PTH, at 256 (for a calcium level of 10.8).  24-hour urine calcium was normal, as was her vitamin D.  Biochemical investigation pointing towards primary hyperparathyroidism. -She has a history of nephrolithiasis and osteoporosis, both possible complications from hyperparathyroidism -I referred her to surgery and she had right inferior parathyroidectomy  by Dr. Harlow Asa on 07/18/2019 -She felt well after surgery, without  complaints.  Surgical scar is healed without keloid -Of note, she has CKD stage IV.  Nephrology is checking PTH levels.  Platelets was normal in 07/2022. -she is on 2000 units vitamin D daily (unclear, patient cannot remember exactly) - will recheck her vitamin D level in 1 mo, at the time of next blood draw - I will see the patient back in 1 year  3. Hypothyroidism - latest thyroid labs reviewed with pt. >> TSH mildly elevated: Lab Results  Component Value Date   TSH 5.140 (H) 09/15/2022  - she continues on LT4 88 mcg daily, increased since last visit.  - I am surprised that her TSH did not improve at all after increasing the levothyroxine dose.  However, the free T4 did increase.  For now, we decided to not decrease the dose due to her history of high blood pressure, but repeat the test in 1 months.  If the TSH remains elevated, we may need to check TSH with ethylene glycol precipitation -for possibly antibody interference. - we discussed about taking the thyroid hormone every day, with water, >30 minutes before breakfast, separated by >4 hours from acid reflux medications, calcium, iron, multivitamins. Pt. is taking it correctly. - will check thyroid tests in 1 month: TSH and fT4 - If labs are abnormal, she will need to return for repeat TFTs in 1.5 months  4.  Osteoporosis -No fractures or falls -She continues on low-dose Fosamax, 35 mg weekly (lower dose due to renal disease) -Possibly improved after her parathyroidectomy in 07/2019 -At last visit, we discussed about having another bone density scan on the same machine -I advised her to discuss with PCP and have the report sent to me.  They tell me that she has this scheduled for 10/2022.  They will have the results faxed to me.  For results, call daughter Cecille Rubin: [phone]  + flu shot today  Addendum: Received selected records about patient's bone density scans: 10/31/2022: L1-L4: -1.5 (+3%) Left total hip: -2.8  (-2.8%)  10/29/2020: L1-L4: -1.6 (+6.1%*) Left total hip: -2.7 (+5.7%) Femoral neck: -2.7  From the above data, it appears that her lumbar spine bone density improved while the left total hip bone density is approximately stable.  We can continue Fosamax for now.  Philemon Kingdom, MD PhD [facility]

## 2022-09-24 NOTE — Patient Instructions (Addendum)
Please have labs drawn at [place] in 1 month.  Continue vitamin D 4000 units daily.  Try to get a new Bone density scan on the same machine as before.  Continue Levothyroxine 88 mcg daily.  Take the thyroid hormone every day, with water, at least 30 minutes before breakfast, separated by at least 4 hours from: - acid reflux medications - calcium - iron - multivitamins  Please come back for a follow-up appointment in 1 year.

## 2022-10-22 ENCOUNTER — Encounter: Payer: Self-pay | Admitting: Internal Medicine

## 2022-11-19 ENCOUNTER — Telehealth: Payer: Self-pay

## 2022-11-19 NOTE — Telephone Encounter (Signed)
Attempted to contact pt regarding DEXA scan results. Lvm for pt to call back. Per provider: Can you please let her know:  I received the results of her most recent bone density scan and it appears that her lumbar spine bone density improved significantly while the left total hip bone density is approximately stable.  We can continue Fosamax for now.  Ty!  C

## 2022-11-26 NOTE — Telephone Encounter (Signed)
Pt's spouse Elenore Rota contacted the office to follow up regarding DEXA scan. Advised of results. Donald verbalized understanding.

## 2022-12-08 ENCOUNTER — Other Ambulatory Visit: Payer: Self-pay | Admitting: Internal Medicine

## 2022-12-16 ENCOUNTER — Emergency Department
Admission: EM | Admit: 2022-12-16 | Discharge: 2022-12-17 | Disposition: A | Discharge status: Home / Self Care | Payer: Medicare Other | Attending: Emergency Medicine | Admitting: Emergency Medicine

## 2022-12-16 ENCOUNTER — Emergency Department (EMERGENCY_DEPARTMENT_HOSPITAL): Payer: Medicare Other

## 2022-12-16 ENCOUNTER — Other Ambulatory Visit: Payer: Self-pay

## 2022-12-16 ENCOUNTER — Encounter (HOSPITAL_COMMUNITY): Payer: Self-pay

## 2022-12-16 DIAGNOSIS — R531 Weakness: Secondary | ICD-10-CM | POA: Insufficient documentation

## 2022-12-16 DIAGNOSIS — E860 Dehydration: Secondary | ICD-10-CM | POA: Insufficient documentation

## 2022-12-16 DIAGNOSIS — R059 Cough, unspecified: Secondary | ICD-10-CM

## 2022-12-16 DIAGNOSIS — Z20822 Contact with and (suspected) exposure to covid-19: Secondary | ICD-10-CM | POA: Insufficient documentation

## 2022-12-16 LAB — COVID-19 ~~LOC~~ MOLECULAR LAB TESTING
INFLUENZA VIRUS TYPE A: NOT DETECTED
INFLUENZA VIRUS TYPE B: NOT DETECTED
RESPIRATORY SYNCTIAL VIRUS (RSV): NOT DETECTED
SARS-CoV-2: NOT DETECTED

## 2022-12-16 NOTE — ED Triage Notes (Signed)
Report sick for 3 weeks now and diagnosed with bronchitis about 1 week ago. Reports dizziness and weakness since Friday night. Dizziness is improving some.

## 2022-12-17 LAB — CBC WITH DIFF
BASOPHIL #: <0.1 10*3/uL (ref <=0.20)
BASOPHIL %: 1 %
EOSINOPHIL #: 0.13 10*3/uL (ref <=0.50)
EOSINOPHIL %: 2 %
HCT: 36.1 % (ref 34.8–46.0)
HGB: 11 g/dL — ABNORMAL LOW (ref 11.5–16.0)
IMMATURE GRANULOCYTE #: <0.1 10*3/uL (ref <0.10)
IMMATURE GRANULOCYTE %: 0 % (ref 0.0–1.0)
LYMPHOCYTE #: 1.31 10*3/uL (ref 1.00–4.80)
LYMPHOCYTE %: 16 %
MCH: 27 pg (ref 26.0–32.0)
MCHC: 30.5 g/dL — ABNORMAL LOW (ref 31.0–35.5)
MCV: 88.7 fL (ref 78.0–100.0)
MONOCYTE #: 0.81 10*3/uL (ref 0.20–1.10)
MONOCYTE %: 10 %
MPV: 9.3 fL (ref 8.7–12.5)
NEUTROPHIL #: 5.82 10*3/uL (ref 1.50–7.70)
NEUTROPHIL %: 71 %
PLATELETS: 361 10*3/uL (ref 150–400)
RBC: 4.07 10*6/uL (ref 3.85–5.22)
RDW-CV: 16.7 % — ABNORMAL HIGH (ref 11.5–15.5)
WBC: 8.1 10*3/uL (ref 3.7–11.0)

## 2022-12-17 LAB — COMPREHENSIVE METABOLIC PANEL, NON-FASTING
ALBUMIN: 3.5 g/dL (ref 3.4–4.8)
ALKALINE PHOSPHATASE: 75 U/L (ref 55–145)
ALT (SGPT): 6 U/L — ABNORMAL LOW (ref 8–22)
ANION GAP: 12 mmol/L (ref 4–13)
AST (SGOT): 12 U/L (ref 8–45)
BILIRUBIN TOTAL: 0.3 mg/dL (ref 0.3–1.3)
BUN/CREA RATIO: 20 (ref 6–22)
BUN: 43 mg/dL — ABNORMAL HIGH (ref 8–25)
CALCIUM: 9.6 mg/dL (ref 8.6–10.3)
CHLORIDE: 109 mmol/L (ref 96–111)
CO2 TOTAL: 21 mmol/L — ABNORMAL LOW (ref 23–31)
CREATININE: 2.13 mg/dL — ABNORMAL HIGH (ref 0.60–1.05)
ESTIMATED GFR - FEMALE: 23 mL/min/BSA — ABNORMAL LOW (ref >=60)
GLUCOSE: 122 mg/dL (ref 65–125)
POTASSIUM: 4.4 mmol/L (ref 3.5–5.1)
PROTEIN TOTAL: 6.6 g/dL (ref 6.0–8.0)
SODIUM: 142 mmol/L (ref 136–145)

## 2022-12-17 LAB — MAGNESIUM: MAGNESIUM: 2 mg/dL (ref 1.8–2.6)

## 2022-12-17 LAB — MONO TEST: MONONUCLEOSIS RAPID TEST: NONREACTIVE

## 2022-12-17 LAB — THYROID STIMULATING HORMONE (SENSITIVE TSH): TSH: 2.097 u[IU]/mL (ref 0.350–4.940)

## 2022-12-17 MED ORDER — SODIUM CHLORIDE 0.9 % IV BOLUS
1000.0000 mL | INJECTION | Status: AC
Start: 2022-12-17 — End: 2022-12-17
  Administered 2022-12-17: 0 mL via INTRAVENOUS
  Administered 2022-12-17: 1000 mL via INTRAVENOUS

## 2022-12-17 NOTE — Discharge Instructions (Signed)
Follow-up with your PCP in 5-7 days.  It was recommended that you have repeat blood work done in a week by your physician due to your dehydration.  Drink a lot of water/fluids.  Take Tylenol or Motrin as needed for pain.

## 2022-12-17 NOTE — ED Nurses Note (Signed)
Dr. Smiley Houseman made aware of BP of 175/77. Pt okay to be discharged.

## 2022-12-17 NOTE — ED Provider Notes (Signed)
Department of Emergency Medicine  [facility]  12/16/2022            HPI:  Patient is a 82 y.o. female presenting to the ED with chief complaint of being sick for the past 2-3 weeks.  The patient states that she has had a cough and bronchitis type symptoms which started about 3 weeks ago.  She talked to her doctor about it but was not put on antibiotics.  Last week she went to an urgent care and they put her on antibiotics which she has been on for the past 8 days.  She continues to have a dry nonproductive cough but it is improving.  She denies any documented fevers.  She has had some chills.  No sweats.  She has poor appetite and activity and has been wanting to sleep more than you normal.  She denies any chest pain or shortness of breath or dyspnea exertion.  No abdominal pain.  No nausea, vomiting or diarrhea.  No headaches or body aches.  She denies any dysuria, urgency or frequency.              Past Medical History:   Diagnosis Date    Anemia     Atrial fibrillation (CMS HCC)     CKD (chronic kidney disease)     Diverticulosis     Essential hypertension     History of nephrolithiasis     Hypertension     Kidney stones     Mixed hyperlipidemia     Paroxysmal atrial fibrillation (CMS HCC)     Rheumatoid arthritis (CMS HCC)     Sleep apnea     Thyroid disease     Ventricular ectopy      Past Surgical History:   Procedure Laterality Date    Hx cyst incision and drainage      Hx parathyroidectomy      Hx partial hysterectomy      Hx partial thyroidectomy      Hx tonsil and adenoidectomy      Hx tubal ligation      Hx wrist fracture tx Left      Allergies   Allergen Reactions    Adhesive Rash     Needs tape for SENSITIVE SKIN    Bactrim [Sulfamethoxazole-Trimethoprim]     Ciprofloxacin     Digoxin     Plaquenil [Hydroxychloroquine]     Simvastatin     Sulfa (Sulfonamides)       Current Outpatient Medications   Medication Sig    acetaminophen (ARTHRITIS PAIN RELIEF) 650 mg Oral Tablet Sustained  Release Take 1 Tablet (650 mg total) by mouth Every 8 hours as needed for Pain    amiodarone (PACERONE) 100 mg Oral Tablet Take 1 Tablet (100 mg total) by mouth Once a day for 180 days    amLODIPine (NORVASC) 5 mg Oral Tablet Take 1 Tablet (5 mg total) by mouth Once a day    apixaban (ELIQUIS) 2.5 mg Oral Tablet Take 1 Tablet (2.5 mg total) by mouth Twice daily    cholecalciferol, vitamin D3, 1,000 unit Oral Tablet Take 2 Tablets (2,000 Units total) by mouth Twice daily    cloNIDine HCL (CATAPRES) 0.1 mg Oral Tablet Take 1 Tablet (0.1 mg total) by mouth Three times a day (Patient taking differently: Take 1 Tablet (0.1 mg total) by mouth Twice daily)    cyanocobalamin (VITAMIN B 12) 1,000 mcg Oral Tablet Take 1 Tablet (1,000 mcg total) by mouth  Once a day    doxycycline 100 mg Oral Tablet Take 1 Tablet (100 mg total) by mouth Twice daily    levothyroxine (SYNTHROID) 75 mcg Oral Tablet Take 1 Tab (75 mcg total) by mouth Every morning    lisinopriL (PRINIVIL) 40 mg Oral Tablet TAKE ONE TABLET BY MOUTH ONCE DAILY    polysaccharide iron complex (FERREX 150) 150 mg iron Oral Capsule Take 1 capsule by mouth once daily      Above history reviewed with patient.  Previous records also reviewed.       All other systems reviewed and are negative, unless commented on in the HPI.       Filed Vitals:    12/17/22 0100 12/17/22 0115 12/17/22 0130 12/17/22 0145   BP: (!) 187/72 (!) 199/94 (!) 192/74 (!) 175/77   Pulse: 78   82   Resp: 18   18   Temp:       SpO2: 98% 99% 98% 99%       Physical Exam  Vitals and nursing note reviewed.   Constitutional:       General: She is not in acute distress.     Appearance: Normal appearance. She is not ill-appearing or diaphoretic.   HENT:      Head: Normocephalic and atraumatic.      Nose: Nose normal.      Mouth/Throat:      Mouth: Mucous membranes are moist.      Pharynx: Oropharynx is clear.   Eyes:      Extraocular Movements: Extraocular movements intact.      Conjunctiva/sclera: Conjunctivae  normal.      Pupils: Pupils are equal, round, and reactive to light.   Neck:      Thyroid: No thyromegaly.      Vascular: No JVD.      Trachea: No tracheal deviation.   Cardiovascular:      Rate and Rhythm: Normal rate and regular rhythm.      Pulses: Normal pulses.      Heart sounds: Normal heart sounds. No murmur heard.     No friction rub. No gallop.   Pulmonary:      Effort: Pulmonary effort is normal. No respiratory distress.      Breath sounds: Normal breath sounds. No wheezing, rhonchi or rales.   Abdominal:      General: Abdomen is flat. Bowel sounds are normal. There is no distension.      Palpations: Abdomen is soft.      Tenderness: There is no abdominal tenderness. There is no guarding or rebound.   Musculoskeletal:         General: No swelling. Normal range of motion.      Cervical back: Normal range of motion and neck supple.      Right lower leg: No edema.      Left lower leg: No edema.   Lymphadenopathy:      Cervical: No cervical adenopathy.   Skin:     General: Skin is warm and dry.      Capillary Refill: Capillary refill takes less than 2 seconds.      Findings: No rash.   Neurological:      General: No focal deficit present.      Mental Status: She is alert and oriented to person, place, and time.      Cranial Nerves: No cranial nerve deficit.      Gait: Gait is intact.      Deep Tendon Reflexes:  Reflexes are normal and symmetric.   Psychiatric:         Mood and Affect: Mood and affect normal.         Cognition and Memory: Memory normal.         Judgment: Judgment normal.                Workup:     Results for orders placed or performed during the hospital encounter of 12/16/22 (from the past 24 hour(s))   COVID - 19 SCREENING - Symptomatic - PUI   Result Value Ref Range    SARS-CoV-2 Not Detected Not Detected    INFLUENZA VIRUS TYPE A Not Detected Not Detected    INFLUENZA VIRUS TYPE B Not Detected Not Detected    RESPIRATORY SYNCTIAL VIRUS (RSV) Not Detected Not Detected    Narrative    Results  are for the simultaneous qualitative identification of SARS-CoV-2 (formerly 2019-nCoV), Influenza A, Influenza B, and RSV RNA. These etiologic agents are generally detectable in nasopharyngeal and nasal swabs during the ACUTE PHASE of infection. Hence, this test is intended to be performed on respiratory specimens collected from individuals with signs and symptoms of upper respiratory tract infection who meet Centers for Disease Control and Prevention (CDC) clinical and/or epidemiological criteria for Coronavirus Disease 2019 (COVID-19) testing. CDC COVID-19 criteria for testing on human specimens is available at [facility] webpage information for Healthcare Professionals: Coronavirus Disease 2019 (COVID-19) ([email or web address]).     False-negative results may occur if the virus has genomic mutations, insertions, deletions, or rearrangements or if performed very early in the course of illness. Otherwise, negative results indicate virus specific RNA targets are not detected, however negative results do not preclude SARS-CoV-2 infection/COVID-19, Influenza, or Respiratory syncytial virus infection. Results should not be used as the sole basis for patient management decisions. Negative results must be combined with clinical observations, patient history, and epidemiological information. If upper respiratory tract infection is still suspected based on exposure history together with other clinical findings, re-testing should be considered.    Disclaimer:   This assay has been authorized by FDA under an Emergency Use Authorization for use in laboratories certified under the Clinical Laboratory Improvement Amendments of 1988 (CLIA), 42 U.S.C. [phone], to perform high complexity tests. The impacts of vaccines, antiviral therapeutics, antibiotics, chemotherapeutic or immunosuppressant drugs have not been evaluated.     Test methodology:   Cepheid Xpert Xpress SARS-CoV-2/Flu/RSV Assay real-time  polymerase chain reaction (RT-PCR) test on the GeneXpert Dx and Xpert Xpress systems.   CBC/DIFF    Narrative    The following orders were created for panel order CBC/DIFF.  Procedure                               Abnormality         Status                     ---------                               -----------         ------                     CBC WITH PXTG[[id number]]                Abnormal  Final result                 Please view results for these tests on the individual orders.   COMPREHENSIVE METABOLIC PANEL, NON-FASTING   Result Value Ref Range    SODIUM 142 136 - 145 mmol/L    POTASSIUM 4.4 3.5 - 5.1 mmol/L    CHLORIDE 109 96 - 111 mmol/L    CO2 TOTAL 21 (L) 23 - 31 mmol/L    ANION GAP 12 4 - 13 mmol/L    BUN 43 (H) 8 - 25 mg/dL    CREATININE 2.13 (H) 0.60 - 1.05 mg/dL    BUN/CREA RATIO 20 6 - 22    ALBUMIN 3.5 3.4 - 4.8 g/dL     CALCIUM 9.6 8.6 - 10.3 mg/dL    GLUCOSE 122 65 - 125 mg/dL    ALKALINE PHOSPHATASE 75 55 - 145 U/L    ALT (SGPT) 6 (L) 8 - 22 U/L    AST (SGOT)  12 8 - 45 U/L    BILIRUBIN TOTAL 0.3 0.3 - 1.3 mg/dL    PROTEIN TOTAL 6.6 6.0 - 8.0 g/dL    ESTIMATED GFR - FEMALE 23 (L) >=60 mL/min/BSA   MAGNESIUM   Result Value Ref Range    MAGNESIUM 2.0 1.8 - 2.6 mg/dL   CBC WITH DIFF   Result Value Ref Range    WBC 8.1 3.7 - 11.0 x10^3/uL    RBC 4.07 3.85 - 5.22 x10^6/uL    HGB 11.0 (L) 11.5 - 16.0 g/dL    HCT 36.1 34.8 - 46.0 %    MCV 88.7 78.0 - 100.0 fL    MCH 27.0 26.0 - 32.0 pg    MCHC 30.5 (L) 31.0 - 35.5 g/dL    RDW-CV 16.7 (H) 11.5 - 15.5 %    PLATELETS 361 150 - 400 x10^3/uL    MPV 9.3 8.7 - 12.5 fL    NEUTROPHIL % 71.0 %    LYMPHOCYTE % 16.0 %    MONOCYTE % 10.0 %    EOSINOPHIL % 2.0 %    BASOPHIL % 1.0 %    NEUTROPHIL # 5.82 1.50 - 7.70 x10^3/uL    LYMPHOCYTE # 1.31 1.00 - 4.80 x10^3/uL    MONOCYTE # 0.81 0.20 - 1.10 x10^3/uL    EOSINOPHIL # 0.13 <=0.50 x10^3/uL    BASOPHIL # <0.10 <=0.20 x10^3/uL    IMMATURE GRANULOCYTE % 0.0 0.0 - 1.0 %    IMMATURE GRANULOCYTE # <0.10 <0.10  x10^3/uL   THYROID STIMULATING HORMONE (SENSITIVE TSH)   Result Value Ref Range    TSH 2.097 0.350 - 4.940 uIU/mL   MONO TEST   Result Value Ref Range    MONONUCLEOSIS RAPID TEST Nonreactive Nonreactive       Results for orders placed or performed during the hospital encounter of 12/16/22 (from the past 72 hour(s))   XR CHEST PA AND LATERAL     Status: None    Narrative    Bennetta MAE Kolenda  Female, 82 years old.    XR CHEST PA AND LATERAL performed on 12/16/2022 11:28 PM.    REASON FOR EXAM:  cough    TECHNIQUE: 2 views/2 images submitted for interpretation.    COMPARISON:  06/11/2016      Impression    Findings/impression: Stable chest without acute cardiopulmonary finding.      Radiologist location ID: [id number]         Orders Placed This Encounter  XR CHEST PA AND LATERAL    COVID - 19 SCREENING - Symptomatic - PUI    CBC/DIFF    COMPREHENSIVE METABOLIC PANEL, NON-FASTING    MAGNESIUM    CBC WITH DIFF    THYROID STIMULATING HORMONE (SENSITIVE TSH)    MONO TEST    NS bolus infusion 1,000 mL           Abnormal Lab results:  Labs Ordered/Reviewed   COMPREHENSIVE METABOLIC PANEL, NON-FASTING - Abnormal; Notable for the following components:       Result Value    CO2 TOTAL 21 (*)     BUN 43 (*)     CREATININE 2.13 (*)     ALT (SGPT) 6 (*)     ESTIMATED GFR - FEMALE 23 (*)     All other components within normal limits   CBC WITH DIFF - Abnormal; Notable for the following components:    HGB 11.0 (*)     MCHC 30.5 (*)     RDW-CV 16.7 (*)     All other components within normal limits   COVID-19 [facility] TESTING - Normal    Narrative:     Results are for the simultaneous qualitative identification of SARS-CoV-2 (formerly 2019-nCoV), Influenza A, Influenza B, and RSV RNA. These etiologic agents are generally detectable in nasopharyngeal and nasal swabs during the ACUTE PHASE of infection. Hence, this test is intended to be performed on respiratory specimens collected from individuals with signs and symptoms  of upper respiratory tract infection who meet Centers for Disease Control and Prevention (CDC) clinical and/or epidemiological criteria for Coronavirus Disease 2019 (COVID-19) testing. CDC COVID-19 criteria for testing on human specimens is available at [facility] webpage information for Healthcare Professionals: Coronavirus Disease 2019 (COVID-19) ([email or web address]).                                     False-negative results may occur if the virus has genomic mutations, insertions, deletions, or rearrangements or if performed very early in the course of illness. Otherwise, negative results indicate virus specific RNA targets are not detected, however negative results do not preclude SARS-CoV-2 infection/COVID-19, Influenza, or Respiratory syncytial virus infection. Results should not be used as the sole basis for patient management decisions. Negative results must be combined with clinical observations, patient history, and epidemiological information. If upper respiratory tract infection is still suspected based on exposure history together with other clinical findings, re-testing should be considered.                                    Disclaimer:                   This assay has been authorized by FDA under an Emergency Use Authorization for use in laboratories certified under the Clinical Laboratory Improvement Amendments of 1988 (CLIA), 42 U.S.C. [phone], to perform high complexity tests. The impacts of vaccines, antiviral therapeutics, antibiotics, chemotherapeutic or immunosuppressant drugs have not been evaluated.                                     Test methodology:                   Cepheid Xpert Xpress SARS-CoV-2/Flu/RSV Assay real-time  polymerase chain reaction (RT-PCR) test on the GeneXpert Dx and Xpert Xpress systems.   MAGNESIUM - Normal   THYROID STIMULATING HORMONE (SENSITIVE TSH) - Normal   MONO TEST - Normal   CBC/DIFF    Narrative:     The following orders were  created for panel order CBC/DIFF.                  Procedure                               Abnormality         Status                                     ---------                               -----------         ------                                     CBC WITH MHDQ[[id number]]                Abnormal            Final result                                                 Please view results for these tests on the individual orders.         Medical Decision Making            Problems Addressed:  Dehydration: acute illness or injury  Generalized weakness: acute illness or injury    Amount and/or Complexity of Data Reviewed  Labs: ordered. Decision-making details documented in ED Course.  Radiology: ordered. Decision-making details documented in ED Course.            ED Course:  Appropriate labs and imaging ordered. Medical Records reviewed.   During the patient's stay in the emergency department, the above listed imaging and/or labs were performed to assist with medical decision making and were reviewed by myself when available for review.  Pt remained stable throughout the emergency department course.    The patient was seen and evaluated.  The patient has been sick for the past 2-3 weeks or so.  She started on antibiotics little over a week ago and has just a few pills left on that prescription.  She has been taking doxycycline 100 mg twice a day.  She continues to have a cough with no sputum production.  The cough is getting somewhat better.  She does continued have generalized weakness and poor appetite and activity.  Labs and chest x-ray ordered on the patient.  Chest x-ray reveals no infiltrates or any acute abnormalities as interpreted by the radiologist reviewed by myself.  COVID, RSV and influenza screening were negative.  TSH is normal at 2.09.  White count is 8.1, hemoglobin 11, hematocrit 36 and platelets are 361.  Sodium is 142, potassium 4.4, chlorides 109, CO2 is 21, BUN is elevated at 43 and  creatinine at  2.13.  Calcium is 9.6 and glucose 122.  Hepatic function panel was normal.  The patient was given IV normal saline 1 L bolus in the emergency department.  I discussed all the results with the patient and her daughter.  She does have some dehydration and I do feel this can contributing to her generalized weakness and she most likely had a viral illness to begin with with lingering symptoms.  She is to continue fluid hydration at home.  Follow-up with the doctor next week for re-evaluation and I did recommend to her that she have repeat laboratory testing to evaluate her dehydration status.  The patient was stable throughout the ED visit was discharged in good condition.            Impression:    Clinical Impression   Generalized weakness (Primary)   Dehydration      Disposition:  Discharged     Follow Up Plan:  Follow-up with PCP or return to the emergency room with any worsening symptoms.

## 2022-12-24 ENCOUNTER — Emergency Department
Admission: EM | Admit: 2022-12-24 | Discharge: 2022-12-24 | Disposition: A | Discharge status: Home / Self Care | Payer: Medicare Other | Attending: Family Medicine | Admitting: Family Medicine

## 2022-12-24 ENCOUNTER — Other Ambulatory Visit: Payer: Self-pay

## 2022-12-24 ENCOUNTER — Encounter (HOSPITAL_COMMUNITY): Payer: Self-pay

## 2022-12-24 DIAGNOSIS — H9202 Otalgia, left ear: Secondary | ICD-10-CM | POA: Insufficient documentation

## 2022-12-24 DIAGNOSIS — L259 Unspecified contact dermatitis, unspecified cause: Secondary | ICD-10-CM | POA: Insufficient documentation

## 2022-12-24 MED ORDER — TETRACAINE 0.5 % EYE DROPS
2.0000 [drp] | OPHTHALMIC | Status: AC
Start: 2022-12-24 — End: 2022-12-24
  Administered 2022-12-24: 2 [drp] via TOPICAL
  Filled 2022-12-24: qty 100

## 2022-12-24 MED ORDER — ONDANSETRON 4 MG DISINTEGRATING TABLET
4.0000 mg | ORAL_TABLET | ORAL | Status: AC
Start: 2022-12-24 — End: 2022-12-24
  Administered 2022-12-24: 4 mg via ORAL
  Filled 2022-12-24: qty 1

## 2022-12-24 MED ORDER — HYDROCODONE 5 MG-ACETAMINOPHEN 325 MG TABLET
1.0000 | ORAL_TABLET | ORAL | Status: AC
Start: 2022-12-24 — End: 2022-12-24
  Administered 2022-12-24: 1 via ORAL
  Filled 2022-12-24: qty 1

## 2022-12-24 MED ORDER — NEOMYCIN-POLYMYXIN-HYDROCORT 3.5 MG-10,000 UNIT/ML-1 % EAR DROPS,SUSP
3.0000 [drp] | Freq: Once | OTIC | Status: AC
Start: 2022-12-24 — End: 2022-12-24
  Administered 2022-12-24: 3 [drp] via OTIC
  Filled 2022-12-24: qty 10

## 2022-12-24 NOTE — ED Provider Notes (Addendum)
[facility]  ED Primary Provider Note  History of Present Illness   Chief Complaint   Patient presents with    Ear Pain     Cynthia Barrera is a 82 y.o. female who had concerns including Ear Pain.  Arrival: The patient arrived by Car    Patient to ER with left ear pain.  Patient has been placing drops in her ear to help with cerumen impaction.  Patient's husband accidentally placed limited oil drops in the ear and caused pain.  Patient is very uncomfortable holding her left ear and moaning at time of exam      Ear Pain    History Reviewed This Encounter: Medical History  Surgical History  Family History  Social History    Physical Exam   ED Triage Vitals [12/24/22 0044]   BP (Non-Invasive) (!) 161/80   Heart Rate 81   Respiratory Rate 16   Temperature 36.3 C (97.4 F)   SpO2 100 %   Weight 72.6 kg (160 lb)   Height 1.626 m (5\' 4" )     Physical Exam  Constitutional:       General: She is in acute distress (In obvious).      Appearance: She is not ill-appearing.   HENT:      Head: Normocephalic and atraumatic.      Left Ear: There is impacted cerumen.      Ears:      Comments: Left ear canal without obvious abnormality other than cerumen impaction there may be some minimal erythema noted no abrasions or ulcerations no other debris and canal     Mouth/Throat:      Mouth: Mucous membranes are moist.   Cardiovascular:      Rate and Rhythm: Normal rate.   Pulmonary:      Effort: Pulmonary effort is normal. No respiratory distress.   Musculoskeletal:         General: Normal range of motion.   Skin:     General: Skin is warm and dry.      Capillary Refill: Capillary refill takes less than 2 seconds.      Findings: No erythema.   Neurological:      General: No focal deficit present.      Mental Status: She is alert and oriented to person, place, and time. Mental status is at baseline.   Psychiatric:         Mood and Affect: Mood normal.         Behavior: Behavior normal.         Thought  Content: Thought content normal.         Judgment: Judgment normal.       Patient Data   Labs Ordered/Reviewed - No data to display  No orders to display     Medical Decision Making        Medical Decision Making  Tetracaine drops to help with pain did advise that they may cause additional burning at onset we will also given oral Norco and flush ear    Problems Addressed:  Ear pain, left: acute illness or injury     Details: Symptoms improved after flushing patient given Cortisporin drops to help with inflammation as well as give antibiotic coverage to use for the next 3 days TID    Risk  Prescription drug management.             Medications Administered in the ED   tetracaine (PONTOCAINE) 0.5 %  ophthalmic solution (2 Drops Topical Given 12/24/22 0106)   HYDROcodone-acetaminophen (NORCO) 5-325 mg per tablet (1 Tablet Oral Given 12/24/22 0106)   ondansetron (ZOFRAN ODT) rapid dissolve tablet (4 mg Oral Given 12/24/22 0106)   neomycin-polymyxin-hydrocortisone (CORTISPORIN) 3.5mg -10,000unit-1% otic suspension (3 Drops Left Ear Given 12/24/22 0256)     Clinical Impression   Ear pain, left - Due to contact dermatitis (Primary)       Disposition: Discharged

## 2022-12-24 NOTE — ED Nurses Note (Signed)
Ear irrigated, wax and debrides removed from left ear. Oil residue in basin after ear was irrigated. Patient tolerated procedure well. Patient states she feels better and is ready to go home. Provider made aware.

## 2022-12-24 NOTE — ED Triage Notes (Signed)
Patient's husband states that he was assisting patient to put ear drops in aprx 1 hr ago and accidentally put lemon essential oil in her left ear. Patient c/o left ear pain and burning sensation.

## 2022-12-24 NOTE — Discharge Instructions (Signed)
Please use the ear drops given in the ER 2 drops 3 times daily for the next 72 hours

## 2022-12-29 ENCOUNTER — Other Ambulatory Visit (INDEPENDENT_AMBULATORY_CARE_PROVIDER_SITE_OTHER): Payer: Self-pay | Admitting: Family Medicine

## 2022-12-29 NOTE — Telephone Encounter (Signed)
PFT was done in 07/2022 and follow up appt is scheduled 09/2023  Andres Shad, RN

## 2022-12-30 ENCOUNTER — Other Ambulatory Visit (INDEPENDENT_AMBULATORY_CARE_PROVIDER_SITE_OTHER): Payer: Self-pay | Admitting: Interventional Cardiology

## 2023-01-15 ENCOUNTER — Encounter (INDEPENDENT_AMBULATORY_CARE_PROVIDER_SITE_OTHER): Payer: Self-pay | Admitting: Obstetrics & Gynecology

## 2023-01-19 ENCOUNTER — Encounter (INDEPENDENT_AMBULATORY_CARE_PROVIDER_SITE_OTHER): Payer: Self-pay | Admitting: Obstetrics & Gynecology

## 2023-01-19 ENCOUNTER — Other Ambulatory Visit: Payer: Self-pay

## 2023-01-19 ENCOUNTER — Ambulatory Visit (INDEPENDENT_AMBULATORY_CARE_PROVIDER_SITE_OTHER): Payer: Medicare Other | Admitting: Obstetrics & Gynecology

## 2023-01-19 VITALS — BP 160/80 | Ht <= 58 in | Wt 165.0 lb

## 2023-01-19 DIAGNOSIS — M810 Age-related osteoporosis without current pathological fracture: Secondary | ICD-10-CM

## 2023-01-19 DIAGNOSIS — R5383 Other fatigue: Secondary | ICD-10-CM

## 2023-01-19 DIAGNOSIS — L900 Lichen sclerosus et atrophicus: Secondary | ICD-10-CM

## 2023-01-19 DIAGNOSIS — Z1211 Encounter for screening for malignant neoplasm of colon: Secondary | ICD-10-CM

## 2023-01-19 DIAGNOSIS — Z1231 Encounter for screening mammogram for malignant neoplasm of breast: Secondary | ICD-10-CM

## 2023-01-19 DIAGNOSIS — Z01419 Encounter for gynecological examination (general) (routine) without abnormal findings: Secondary | ICD-10-CM

## 2023-01-19 NOTE — Progress Notes (Signed)
[place]  [facility]  [address] [place] [address]  [phone]         ANNUAL VISIT    PATIENT: Cynthia Barrera  CHART NUMBER: [id number]  DATE OF SERVICE: 01/19/2023    CC:   Chief Complaint   Patient presents with    Annual Exam     With no problems ta       HPI: Cynthia Barrera is a 82 y.o. year old G5P5000 Patient's last menstrual period was 12/01/1980.Marland Kitchen  Here for her annual exam. Last mammogram neg a few months ago per pt.  Last DEXA also done within last few months by her PCP.  She reports her PCP wanted her off the fosomax but the endocrinologist wanted her treated.  She also sees a nephrologist for her CKD.  Has been off Fosomax a few months.  She has no sx from lichen sclerosis and hasn't had to use the clobetasol ointment for several years.    Problem List          Cardiovascular System    Ventricular ectopy    Chronic bilateral thoracic back pain    Atrial fibrillation (CMS HCC)    Essential hypertension    HLD (hyperlipidemia)       Respiratory    OSA on CPAP       Neurologic    Insomnia       Nephrology    CKD (chronic kidney disease), stage IV (CMS HCC)       Endocrine    B12 deficiency    Hyperparathyroidism (CMS HCC)    Vitamin D deficiency    Glucose intolerance (impaired glucose tolerance)    Hypothyroidism       Hematologic/Lymphatic    IDA (iron deficiency anemia)       Musculoskeletal    Bilateral edema of lower extremity    Osteoporosis    Overview Addendum 01/19/2023  2:11 PM by Sherril Croon, MD     Left hip -2.7 2021, spine -1.8-  PCP started [facility] 2019            Dermatology    Lichen sclerosus et atrophicus    Overview Signed 01/19/2023  9:40 PM by Sherril Croon, MD     Confirmed on previous vulvar biopsy-sx resolved with clobetasol ointment in past         Skin lesion       Other    Fatigue              OB History:  OB History   Gravida Para Term Preterm AB Living   5 5 5         $ SAB IAB  Ectopic Multiple Live Births                  # Outcome Date GA Lbr Len/2nd Weight Sex Delivery Anes PTL Lv   5 Term            4 Term            3 Term            2 Term            1 Term              PAST MEDICAL HISTORY:  Past Medical History:   Diagnosis Date    Anemia     Atrial fibrillation (CMS HCC)     CKD (chronic  kidney disease)     Diverticulosis     Essential hypertension     H/O urinary frequency     History of fibrocystic disease of breast     History of nephrolithiasis     History of osteoporosis     History of retinopathy     Hypertension     Kidney stones     Lichen sclerosus et atrophicus 2016    left labial/periclitoral hood punch bx-tx with clobetasol    Mixed hyperlipidemia     Osteoporosis     -2.7 left hip, improved 2021, -1.8 spine    Paroxysmal atrial fibrillation (CMS HCC)     Rheumatoid arthritis (CMS HCC)     Sleep apnea     Thyroid disease     Ventricular ectopy          PAST SURGICAL HISTORY:  Past Surgical History:   Procedure Laterality Date    HX BREAST BIOPSY  2002    FNA x 3 bilaterally    HX CYST INCISION AND DRAINAGE      about 2000    HX HYSTERECTOMY  1980    TAH    HX PARATHYROIDECTOMY      HX PARTIAL THYROIDECTOMY      parathyroid right lube    HX TONSIL AND ADENOIDECTOMY      HX TUBAL LIGATION      HX WRIST FRACTURE TX Left     about 2004          FAMILY HISTORY:  Family Medical History:       Problem Relation (Age of Onset)    Breast Cancer Daughter (22)    Diabetes Brother, Sister, Daughter    Hypertension (High Blood Pressure) Mother, Brother, Sister    Ovarian Cancer Maternal cousin (72)    Stroke Maternal Grandmother    Thyroid Disease Brother, Sister            SOCIAL HISTORY:  Social History     Socioeconomic History    Marital status: Married   Tobacco Use    Smoking status: Never    Smokeless tobacco: Never   Vaping Use    Vaping status: Never Used   Substance and Sexual Activity    Alcohol use: No     Alcohol/week: 0.0 standard drinks of alcohol    Drug use: No     Sexual activity: Not Currently     Birth control/protection: Post-menopausal, Surgical            01/19/2023     2:00 PM   Comprehensive Health Assessment-Adult   Do you fasten your seatbelt when you are in a car? Yes, usually   Do you exercise 20 minutes 3 or more days per week (such as walking, dancing, biking, mowing grass, swimming)? No, I usually don't exercise this much   How often do you eat food that is healthy (fruits, vegetables, lean meats) instead of unhealthy (sweets, fast food, junk food, fatty foods)? Almost always         Review of Systems:  Weight: 74.8 kg (165 lb)   Height: 62.5 cm (2' 0.61")  BP (Non-Invasive): (!) 160/80  Systemic : Negative for weight change, chills, fever, night sweats, feeling tired or poorly              Breast: Negative for breast pain, nipple discharge, breast lump  Last Annual GYN Exam: 01/10/2019  GYN: Feeling fine, no issues or concerns        Last Mammo Date: 02/12/2021     Last Dexa Date: 10/24/2020  Last Dexa Results: Abnormal (osteoporosis and osteopenia)  Last Colonoscopy Date: 2011     Initials: ta                          CURRENT MEDICATIONS:   Current Outpatient Medications   Medication Sig    acetaminophen (ARTHRITIS PAIN RELIEF) 650 mg Oral Tablet Sustained Release Take 1 Tablet (650 mg total) by mouth Every 8 hours as needed for Pain    apixaban (ELIQUIS) 2.5 mg Oral Tablet Take 1 Tablet (2.5 mg total) by mouth Twice daily    cholecalciferol, vitamin D3, 1,000 unit Oral Tablet Take 2 Tablets (2,000 Units total) by mouth Twice daily    cyanocobalamin (VITAMIN B 12) 1,000 mcg Oral Tablet Take 1 Tablet (1,000 mcg total) by mouth Once a day    levothyroxine (SYNTHROID) 75 mcg Oral Tablet Take 1 Tab (75 mcg total) by mouth Every morning    PACERONE 100 mg Oral Tablet Take 1 tablet by mouth once daily    polysaccharide iron complex (FERREX 150) 150 mg iron Oral Capsule Take 1 capsule by mouth once daily       ALLERGIES:  Adhesive, Bactrim  [sulfamethoxazole-trimethoprim], Ciprofloxacin, Digoxin, Plaquenil [hydroxychloroquine], Simvastatin, and Sulfa (sulfonamides)     PHYSICAL EXAMINATION:   BP (!) 160/80   Ht 0.625 m (2' 0.61")   Wt 74.8 kg (165 lb)   LMP 12/01/1980   Breastfeeding No   BMI 191.60 kg/m       Body mass index is 191.6 kg/m.   General: No acute distress.  HEENT: Normocephalic, Atraumatic  Neck/Thyroid: No evidence of thyromegaly or lymphadenopathy.  Respiratory: Lungs clear to auscultation bilaterally.  Back: no CVAT  Cardiac: Regular rate and rhythm, no murmurs  Abdomen: Soft, Non tender, Non distended without guarding or rebound, no obvious masses noted  Extremities: No cyanosis, clubbing, or edema.  Neurologic: Alert and Orientated. No gross motor abnormalities or deficits noted  Psychiatric: Mood stable.  Hair:  normal distribution  Skin: intact, no lesions  Breasts:  Right breast: normal nipple, no nipple discharge, no tenderness, no obvious masses; no axillary LAN, no skin changes       Left breast:  normal nipple, no nipple discharge, no tenderness, no obvious masses; no axillary LAN, no skin changes  External Exam: normal female genitalia, no abnormalities    Vaginal Exam: atrophic, dry, non-tender, no lesions, normal urethral meatus  Cervix: normal to exam. No lesions,No cervical discharge and no cervical motion tenderness.  Uterus: Uterus is normal to exam, mobile.Adnexa: non-tender, no appreciable masses  RV exam: normal tone, no masses, no nodularity or induration, hemoccult neg    ASSESSMENT:  (Z01.419) Encounter for well woman exam with routine gynecological exam  (primary encounter diagnosis)  Plan: Obtain Outside Medical Records    (M81.0) Age-related osteoporosis without current pathological fracture    (R53.83) Fatigue, unspecified type  Plan: POCT Occult Blood for Stool    (Z12.31) Encounter for screening mammogram for malignant neoplasm of breast    (123XX123) Lichen sclerosus et atrophicus       PLAN:.  Will try  and obtain recent DEXA/mammogram results.  Advised she discuss osteoporosis treatment with her nephrologist as may benefit from tx other than bisphosphonate i.e..Marland KitchenProlia.  D/w pt can forego  further gyn exams unless having a problem.           Orders Placed This Encounter    Obtain Outside Medical Records     Order Specific Question:   Type of Medical Records:     Answer:   DEXA  and mammogram    POCT Occult Blood for Stool               Sherril Croon, MD      This note was partially generated using MModal Fluency Direct system, and there may be some incorrect words, spellings, and punctuation that were not noted in checking the note before saving.

## 2023-01-19 NOTE — Progress Notes (Signed)
DEXA with stable osteoporosis hip and osteopenia spine

## 2023-01-19 NOTE — Progress Notes (Signed)
Mammogram 03/2022 neg

## 2023-01-21 ENCOUNTER — Encounter (INDEPENDENT_AMBULATORY_CARE_PROVIDER_SITE_OTHER): Payer: Self-pay | Admitting: NURSE PRACTITIONER

## 2023-01-21 ENCOUNTER — Ambulatory Visit: Payer: Medicare Other | Attending: NURSE PRACTITIONER | Admitting: NURSE PRACTITIONER

## 2023-01-21 ENCOUNTER — Other Ambulatory Visit: Payer: Self-pay

## 2023-01-21 VITALS — BP 118/70 | HR 88 | Temp 97.8°F | Resp 18 | Wt 164.8 lb

## 2023-01-21 DIAGNOSIS — J329 Chronic sinusitis, unspecified: Secondary | ICD-10-CM | POA: Insufficient documentation

## 2023-01-21 DIAGNOSIS — J398 Other specified diseases of upper respiratory tract: Secondary | ICD-10-CM

## 2023-01-21 DIAGNOSIS — R059 Cough, unspecified: Secondary | ICD-10-CM

## 2023-01-21 DIAGNOSIS — J029 Acute pharyngitis, unspecified: Secondary | ICD-10-CM

## 2023-01-21 DIAGNOSIS — J3489 Other specified disorders of nose and nasal sinuses: Secondary | ICD-10-CM

## 2023-01-21 MED ORDER — AMOXICILLIN 500 MG CAPSULE
500.0000 mg | ORAL_CAPSULE | Freq: Two times a day (BID) | ORAL | 0 refills | Status: AC
Start: 2023-01-21 — End: 2023-01-31

## 2023-01-21 MED ORDER — PROMETHAZINE-DM 6.25 MG-15 MG/5 ML ORAL SYRUP
ORAL_SOLUTION | ORAL | 0 refills | Status: DC
Start: 2023-01-21 — End: 2023-06-01

## 2023-01-21 NOTE — Progress Notes (Signed)
[facility]  [address]  [place] [address]  Phone: [phone]  Fax: [phone]    Encounter Date: 01/21/2023    Patient ID:  Cynthia Barrera  [id number]    DOB: [birth year]  Age: 82 y.o. female    Subjective:     Chief Complaint   Patient presents with    Nasal Drainage    Sore Throat    Cough     Started about one week ago     Nasal Congestion     Cynthia Barrera is an 82 year old female that presents to clinic with a cough, congestion, sore throat, and nasal drainage. Her symptoms have been present for 1 week. She denies any fever, nausea, vomiting, or diarrhea. She states that she had bronchitis around Christmas, and has not felt well since. She has taken OTC medication. She has been exposed to known illness. Her husband is also sick.     The history is provided by the patient.   Sore Throat  Associated symptoms include congestion, coughing and a sore throat.   Cough  Associated symptoms include a sore throat.   Nasal Congestion  Associated symptoms include congestion, coughing and a sore throat.     Current Outpatient Medications   Medication Sig    acetaminophen (ARTHRITIS PAIN RELIEF) 650 mg Oral Tablet Sustained Release Take 1 Tablet (650 mg total) by mouth Every 8 hours as needed for Pain    amoxicillin (AMOXIL) 500 mg Oral Capsule Take 1 Capsule (500 mg total) by mouth Twice daily for 10 days    apixaban (ELIQUIS) 2.5 mg Oral Tablet Take 1 Tablet (2.5 mg total) by mouth Twice daily    cholecalciferol, vitamin D3, 1,000 unit Oral Tablet Take 2 Tablets (2,000 Units total) by mouth Twice daily    cyanocobalamin (VITAMIN B 12) 1,000 mcg Oral Tablet Take 1 Tablet (1,000 mcg total) by mouth Once a day    levothyroxine (SYNTHROID) 75 mcg Oral Tablet Take 1 Tab (75 mcg total) by mouth Every morning    PACERONE 100 mg Oral Tablet Take 1 tablet by mouth once daily    polysaccharide iron complex (FERREX 150) 150 mg iron Oral Capsule Take 1 capsule by mouth once daily     promethazine-dextromethorphan (PHENERGAN-DM) 6.25-15 mg/5 mL Oral Syrup 5 ml PO Q6-8H PRN cough     Allergies   Allergen Reactions    Adhesive Rash     Needs tape for SENSITIVE SKIN    Bactrim [Sulfamethoxazole-Trimethoprim]     Ciprofloxacin     Digoxin     Plaquenil [Hydroxychloroquine]     Simvastatin     Sulfa (Sulfonamides)      Past Medical History:   Diagnosis Date    Anemia     Atrial fibrillation (CMS HCC)     CKD (chronic kidney disease)     Diverticulosis     Essential hypertension     H/O urinary frequency     History of fibrocystic disease of breast     History of nephrolithiasis     History of osteoporosis     History of retinopathy     Hypertension     Kidney stones     Lichen sclerosus et atrophicus 2016    left labial/periclitoral hood punch bx-tx with clobetasol    Mixed hyperlipidemia     Osteoporosis     -2.7 left hip, improved 2021, -1.8 spine    Paroxysmal atrial fibrillation (CMS HCC)  Rheumatoid arthritis (CMS HCC)     Sleep apnea     Thyroid disease     Ventricular ectopy          Past Surgical History:   Procedure Laterality Date    HX BREAST BIOPSY  2002    FNA x 3 bilaterally    HX CYST INCISION AND DRAINAGE      about 2000    HX HYSTERECTOMY  1980    TAH    HX PARATHYROIDECTOMY      HX PARTIAL THYROIDECTOMY      parathyroid right lube    HX TONSIL AND ADENOIDECTOMY      HX TUBAL LIGATION      HX WRIST FRACTURE TX Left     about 2004          Family Medical History:       Problem Relation (Age of Onset)    Breast Cancer Daughter (70)    Diabetes Brother, Sister, Daughter    Hypertension (High Blood Pressure) Mother, Brother, Sister    Ovarian Cancer Maternal cousin (72)    Stroke Maternal Grandmother    Thyroid Disease Brother, Sister            Social History     Tobacco Use    Smoking status: Never    Smokeless tobacco: Never   Vaping Use    Vaping status: Never Used   Substance Use Topics    Alcohol use: No     Alcohol/week: 0.0 standard drinks of alcohol    Drug use: No        Review of Systems   HENT:  Positive for congestion and sore throat.    Respiratory:  Positive for cough.    All other systems reviewed and are negative.    Objective:   Vitals: BP 118/70 (Site: Upper Extremity, Patient Position: Sitting, Cuff Size: Adult)   Pulse 88   Temp 36.6 C (97.8 F) (Thermal Scan)   Resp 18   Wt 74.8 kg (164 lb 12.8 oz)   LMP 12/01/1980   SpO2 98%   BMI 191.37 kg/m         Physical Exam  Vitals and nursing note reviewed.   Constitutional:       Appearance: Normal appearance. She is normal weight.   HENT:      Head: Normocephalic and atraumatic.      Right Ear: Tympanic membrane, ear canal and external ear normal.      Left Ear: Tympanic membrane, ear canal and external ear normal.      Nose: Congestion present.      Right Sinus: Frontal sinus tenderness present.      Left Sinus: Frontal sinus tenderness present.      Mouth/Throat:      Mouth: Mucous membranes are dry.      Pharynx: Oropharynx is clear. Posterior oropharyngeal erythema present.   Eyes:      Extraocular Movements: Extraocular movements intact.      Conjunctiva/sclera: Conjunctivae normal.      Pupils: Pupils are equal, round, and reactive to light.   Cardiovascular:      Rate and Rhythm: Normal rate and regular rhythm.      Pulses: Normal pulses.      Heart sounds: Normal heart sounds.   Pulmonary:      Effort: Pulmonary effort is normal.      Breath sounds: Normal breath sounds.   Abdominal:      General: Abdomen is  flat. Bowel sounds are normal.      Palpations: Abdomen is soft.   Musculoskeletal:         General: Normal range of motion.      Cervical back: Normal range of motion and neck supple.   Skin:     General: Skin is warm and dry.      Capillary Refill: Capillary refill takes less than 2 seconds.   Neurological:      General: No focal deficit present.      Mental Status: She is alert and oriented to person, place, and time. Mental status is at baseline.   Psychiatric:         Mood and Affect: Mood  normal.         Behavior: Behavior normal.         Thought Content: Thought content normal.         Judgment: Judgment normal.       Assessment & Plan:     ENCOUNTER DIAGNOSES     ICD-10-CM   1. Cough, unspecified type  R05.9   2. Congestion of upper respiratory tract  J39.8   3. Sore throat  J02.9   4. Nasal drainage  J34.89   5. Sinusitis, unspecified chronicity, unspecified location  J32.9       Orders Placed This Encounter    amoxicillin (AMOXIL) 500 mg Oral Capsule    promethazine-dextromethorphan (PHENERGAN-DM) 6.25-15 mg/5 mL Oral Syrup     See Patient Instructions    Return if symptoms worsen or fail to improve.    Bebe Shaggy, FNP

## 2023-01-21 NOTE — Nursing Note (Signed)
Travel Screening       Question Response    Have you been in contact with someone who was sick? --    Do you have any of the following new or worsening symptoms? Cough    Have you traveled internationally in the last month? No          Travel History   Travel since 12/21/22    No documented travel since 12/21/22       Meilech Virts, LPN

## 2023-03-27 ENCOUNTER — Other Ambulatory Visit (INDEPENDENT_AMBULATORY_CARE_PROVIDER_SITE_OTHER): Payer: Self-pay | Admitting: Family Medicine

## 2023-03-27 MED ORDER — AMIODARONE 100 MG TABLET
100.0000 mg | ORAL_TABLET | Freq: Every day | ORAL | 1 refills | Status: DC
Start: 2023-03-27 — End: 2023-09-24

## 2023-06-01 ENCOUNTER — Emergency Department
Admission: EM | Admit: 2023-06-01 | Discharge: 2023-06-02 | Disposition: A | Discharge status: Home / Self Care | Payer: Medicare Other | Attending: Emergency Medicine | Admitting: Emergency Medicine

## 2023-06-01 ENCOUNTER — Emergency Department (HOSPITAL_COMMUNITY): Payer: Medicare Other

## 2023-06-01 ENCOUNTER — Other Ambulatory Visit: Payer: Self-pay

## 2023-06-01 ENCOUNTER — Encounter (HOSPITAL_COMMUNITY): Payer: Self-pay

## 2023-06-01 DIAGNOSIS — R9082 White matter disease, unspecified: Secondary | ICD-10-CM

## 2023-06-01 DIAGNOSIS — I10 Essential (primary) hypertension: Secondary | ICD-10-CM | POA: Insufficient documentation

## 2023-06-01 DIAGNOSIS — Z1152 Encounter for screening for COVID-19: Secondary | ICD-10-CM | POA: Insufficient documentation

## 2023-06-01 LAB — CBC WITH DIFF
BASOPHIL #: <0.1 10*3/uL (ref <=0.20)
BASOPHIL %: 0 %
EOSINOPHIL #: 0.27 10*3/uL (ref <=0.50)
EOSINOPHIL %: 3 %
HCT: 36.4 % (ref 34.8–46.0)
HGB: 11.5 g/dL (ref 11.5–16.0)
IMMATURE GRANULOCYTE #: <0.1 10*3/uL (ref <0.10)
IMMATURE GRANULOCYTE %: 0 % (ref 0.0–1.0)
LYMPHOCYTE #: 1.87 10*3/uL (ref 1.00–4.80)
LYMPHOCYTE %: 18 %
MCH: 28.2 pg (ref 26.0–32.0)
MCHC: 31.6 g/dL (ref 31.0–35.5)
MCV: 89.2 fL (ref 78.0–100.0)
MONOCYTE #: 1 10*3/uL (ref 0.20–1.10)
MONOCYTE %: 10 %
MPV: 10 fL (ref 8.7–12.5)
NEUTROPHIL #: 7.3 10*3/uL (ref 1.50–7.70)
NEUTROPHIL %: 69 %
PLATELETS: 331 10*3/uL (ref 150–400)
RBC: 4.08 10*6/uL (ref 3.85–5.22)
RDW-CV: 15.3 % (ref 11.5–15.5)
WBC: 10.5 10*3/uL (ref 3.7–11.0)

## 2023-06-01 LAB — URINALYSIS, MACRO/MICRO
BILIRUBIN: NEGATIVE mg/dL
GLUCOSE: NEGATIVE mg/dL
KETONES: NEGATIVE mg/dL
NITRITE: NEGATIVE
PH: 6.5 (ref 4.6–8.0)
PROTEIN: 30 mg/dL — AB
SPECIFIC GRAVITY: 1.015 (ref 1.005–1.030)
UROBILINOGEN: 0.2 mg/dL (ref 0.2–1.0)

## 2023-06-01 LAB — COVID-19 ~~LOC~~ MOLECULAR LAB TESTING
INFLUENZA VIRUS TYPE A: NOT DETECTED
INFLUENZA VIRUS TYPE B: NOT DETECTED
RESPIRATORY SYNCTIAL VIRUS (RSV): NOT DETECTED
SARS-CoV-2: NOT DETECTED

## 2023-06-01 LAB — URINALYSIS, MICROSCOPIC

## 2023-06-01 LAB — BASIC METABOLIC PANEL
ANION GAP: 15 mmol/L — ABNORMAL HIGH (ref 4–13)
BUN/CREA RATIO: 17 (ref 6–22)
BUN: 36 mg/dL — ABNORMAL HIGH (ref 8–25)
CALCIUM: 10.3 mg/dL (ref 8.6–10.3)
CHLORIDE: 102 mmol/L (ref 96–111)
CO2 TOTAL: 20 mmol/L — ABNORMAL LOW (ref 23–31)
CREATININE: 2.06 mg/dL — ABNORMAL HIGH (ref 0.60–1.05)
ESTIMATED GFR - FEMALE: 24 mL/min/BSA — ABNORMAL LOW (ref >=60)
GLUCOSE: 104 mg/dL (ref 65–125)
POTASSIUM: 4.3 mmol/L (ref 3.5–5.1)
SODIUM: 137 mmol/L (ref 136–145)

## 2023-06-01 LAB — MAGNESIUM: MAGNESIUM: 2.1 mg/dL (ref 1.8–2.6)

## 2023-06-01 LAB — TROPONIN-I: TROPONIN-I HS: 8.4 ng/L (ref <=14.0)

## 2023-06-01 LAB — B-TYPE NATRIURETIC PEPTIDE (BNP),PLASMA: BNP: 389 pg/mL — ABNORMAL HIGH (ref <=99)

## 2023-06-01 MED ORDER — SODIUM CHLORIDE 0.9 % INTRAVENOUS PIGGYBACK
3.0000 g | INTRAVENOUS | Status: AC
Start: 2023-06-01 — End: 2023-06-01
  Administered 2023-06-01: 0 g via INTRAVENOUS
  Administered 2023-06-01: 3 g via INTRAVENOUS
  Filled 2023-06-01: qty 8

## 2023-06-01 MED ORDER — ENALAPRILAT 1.25 MG/ML INTRAVENOUS SOLUTION
1.2500 mg | INTRAVENOUS | Status: AC
Start: 2023-06-01 — End: 2023-06-01
  Administered 2023-06-01: 1.25 mg via INTRAVENOUS
  Filled 2023-06-01: qty 1

## 2023-06-01 NOTE — ED Triage Notes (Signed)
Patient reports elevated blood pressure readings starting yesterday. Reports she's also having frequent urination.

## 2023-06-01 NOTE — ED Provider Notes (Signed)
[facility]  ED Primary Provider Note  History of Present Illness   Chief Complaint   Patient presents with    Hypertension     Cynthia Barrera is a 82 y.o. female who had concerns including Hypertension.  Arrival: The patient arrived by Car    HPI patient reports frequent urination and elevated blood pressure starting yesterday.  She is taking her medicines as prescribed.  Mild headache, no shortness of breath, no chest pain.  Reports she is no longer taking her Norvasc  History Reviewed This Encounter: Medical History  Surgical History  Family History  Social History    Physical Exam   ED Triage Vitals [06/01/23 2145]   BP (Non-Invasive) (!) 209/103   Heart Rate 95   Respiratory Rate 16   Temperature 36.3 C (97.4 F)   SpO2 95 %   Weight 74.7 kg (164 lb 11.2 oz)   Height 1.6 m (5\' 3" )     Physical Exam  Vitals and nursing note reviewed.   Constitutional:       General: She is not in acute distress.     Appearance: Normal appearance.   Eyes:      Extraocular Movements: Extraocular movements intact.      Pupils: Pupils are equal, round, and reactive to light.   Cardiovascular:      Rate and Rhythm: Normal rate and regular rhythm.      Pulses: Normal pulses.      Heart sounds: Normal heart sounds.   Pulmonary:      Effort: Pulmonary effort is normal.      Breath sounds: Normal breath sounds.   Skin:     General: Skin is warm and dry.      Capillary Refill: Capillary refill takes less than 2 seconds.      Coloration: Skin is not pale.      Findings: No bruising, erythema or rash.   Neurological:      Mental Status: She is alert and oriented to person, place, and time.   Psychiatric:         Behavior: Behavior normal.       Patient Data     Labs Ordered/Reviewed   URINALYSIS, MACRO/MICRO - Abnormal; Notable for the following components:       Result Value    PROTEIN 30 (*)     BLOOD Trace (*)     LEUKOCYTES Small (*)     APPEARANCE Slightly Hazy (*)     All other components within  normal limits   BASIC METABOLIC PANEL - Abnormal; Notable for the following components:    CO2 TOTAL 20 (*)     ANION GAP 15 (*)     BUN 36 (*)     CREATININE 2.06 (*)     ESTIMATED GFR - FEMALE 24 (*)     All other components within normal limits   B-TYPE NATRIURETIC PEPTIDE (BNP),PLASMA - Abnormal; Notable for the following components:    BNP 389 (*)     All other components within normal limits   MAGNESIUM - Normal   TROPONIN-I - Normal   COVID-19  MOLECULAR LAB TESTING - Normal    Narrative:     Results are for the simultaneous qualitative identification of SARS-CoV-2 (formerly 2019-nCoV), Influenza A, Influenza B, and RSV RNA. These etiologic agents are generally detectable in nasopharyngeal and nasal swabs during the ACUTE PHASE of infection. Hence, this test is intended to be performed on respiratory  specimens collected from individuals with signs and symptoms of upper respiratory tract infection who meet Centers for Disease Control and Prevention (CDC) clinical and/or epidemiological criteria for Coronavirus Disease 2019 (COVID-19) testing. CDC COVID-19 criteria for testing on human specimens is available at [facility] webpage information for Healthcare Professionals: Coronavirus Disease 2019 (COVID-19) ([employer]).     False-negative results may occur if the virus has genomic mutations, insertions, deletions, or rearrangements or if performed very early in the course of illness. Otherwise, negative results indicate virus specific RNA targets are not detected, however negative results do not preclude SARS-CoV-2 infection/COVID-19, Influenza, or Respiratory syncytial virus infection. Results should not be used as the sole basis for patient management decisions. Negative results must be combined with clinical observations, patient history, and epidemiological information. If upper respiratory tract infection is still suspected based on exposure history together  with other clinical findings, re-testing should be considered.    Disclaimer:   This assay has been authorized by FDA under an Emergency Use Authorization for use in laboratories certified under the Clinical Laboratory Improvement Amendments of 1988 (CLIA), 42 U.S.C. [phone], to perform high complexity tests. The impacts of vaccines, antiviral therapeutics, antibiotics, chemotherapeutic or immunosuppressant drugs have not been evaluated.     Test methodology:   Cepheid Xpert Xpress SARS-CoV-2/Flu/RSV Assay real-time polymerase chain reaction (RT-PCR) test on the GeneXpert Dx and Xpert Xpress systems.   URINALYSIS, MICROSCOPIC - Normal   URINALYSIS WITH REFLEX MICROSCOPIC AND CULTURE IF POSITIVE    Narrative:     The following orders were created for panel order URINALYSIS WITH REFLEX MICROSCOPIC AND CULTURE IF POSITIVE.  Procedure                               Abnormality         Status                     ---------                               -----------         ------                     URINALYSIS, MACRO/MICRO[[id number]]      Abnormal            Final result               URINALYSIS, MICROSCOPIC[[id number]]      Normal              Final result                 Please view results for these tests on the individual orders.   CBC/DIFF    Narrative:     The following orders were created for panel order CBC/DIFF.  Procedure                               Abnormality         Status                     ---------                               -----------         ------  CBC WITH ZOXW[[id number]]                                    Final result                 Please view results for these tests on the individual orders.   CBC WITH DIFF     CT BRAIN WO IV CONTRAST   Final Result by Edi, Radresults In (07/01 2251)   No Acute finding.         Radiologist location ID: [id number]           Medical Decision Making        Medical Decision Making  82 year old female with irritable urinary symptoms and  hypertension.  See HPI and physical exam for details.  After evaluation, this patient was treated and brought her blood pressure to an acceptable level prior to discharge.  She was advised to continue taking all medicines as directed and to follow up with her primary care provider.  Also to follow a low-sodium diet.    Amount and/or Complexity of Data Reviewed  Labs: ordered.  Radiology: ordered.  ECG/medicine tests: ordered.    Risk  Prescription drug management.                Medications Administered in the ED   ampicillin-sulbactam (UNASYN) 3 g in NS 100 mL IVPB minibag (0 g Intravenous Stopped 06/01/23 2315)   enalaprilat (VASOTEC) 1.25 mg/mL injection (1.25 mg Intravenous Given 06/01/23 2345)     Clinical Impression   Elevated blood pressure reading with diagnosis of hypertension (Primary)       Disposition: Discharged         The attending physician was present in the emergency department, participated in the care of this patient, and reviewed care provided and assisted with medical decision making.  Tollie Pizza, PA-C  06/06/2023, 11:00      I personally saw and evaluated the patient. See mid-level's note for additional details. My findings/participation are elevated blood pressure reading with diagnosis hypertension.  Continue current medications.  Follow up with PCP.    Laroy Apple, DO

## 2023-06-02 LAB — GRAY TOP TUBE

## 2023-06-02 LAB — GOLD TOP TUBE

## 2023-06-02 LAB — BLUE TOP TUBE

## 2023-06-02 NOTE — Discharge Instructions (Signed)
Follow-up with your PCP in 5-7 days.  Continue taking your current medications.  Low-sodium diet.

## 2023-06-02 NOTE — ED Nurses Note (Signed)
Patient discharged home with family.  AVS reviewed with patient/care giver.  A written copy of the AVS and discharge instructions was given to the patient/care giver.  Questions sufficiently answered as needed.  Patient/care giver encouraged to follow up with PCP as indicated.  In the event of an emergency, patient/care giver instructed to call 911 or go to the nearest emergency room.

## 2023-07-09 ENCOUNTER — Other Ambulatory Visit (INDEPENDENT_AMBULATORY_CARE_PROVIDER_SITE_OTHER): Payer: Self-pay | Admitting: Family Medicine

## 2023-07-13 ENCOUNTER — Other Ambulatory Visit: Payer: Self-pay | Admitting: Internal Medicine

## 2023-07-28 ENCOUNTER — Ambulatory Visit (INDEPENDENT_AMBULATORY_CARE_PROVIDER_SITE_OTHER): Payer: Self-pay | Admitting: Physician Assistant

## 2023-07-28 ENCOUNTER — Encounter (INDEPENDENT_AMBULATORY_CARE_PROVIDER_SITE_OTHER): Payer: Self-pay

## 2023-07-28 ENCOUNTER — Other Ambulatory Visit: Payer: Self-pay

## 2023-07-28 VITALS — BP 150/74 | Ht 63.0 in | Wt 170.0 lb

## 2023-07-28 DIAGNOSIS — N184 Chronic kidney disease, stage 4 (severe): Secondary | ICD-10-CM

## 2023-07-28 DIAGNOSIS — E782 Mixed hyperlipidemia: Secondary | ICD-10-CM

## 2023-07-28 DIAGNOSIS — G4733 Obstructive sleep apnea (adult) (pediatric): Secondary | ICD-10-CM

## 2023-07-28 DIAGNOSIS — E039 Hypothyroidism, unspecified: Secondary | ICD-10-CM

## 2023-07-28 DIAGNOSIS — I10 Essential (primary) hypertension: Secondary | ICD-10-CM

## 2023-07-28 DIAGNOSIS — Z789 Other specified health status: Secondary | ICD-10-CM | POA: Insufficient documentation

## 2023-07-28 DIAGNOSIS — I517 Cardiomegaly: Secondary | ICD-10-CM

## 2023-07-28 DIAGNOSIS — I480 Paroxysmal atrial fibrillation: Secondary | ICD-10-CM

## 2023-07-28 DIAGNOSIS — Z79899 Other long term (current) drug therapy: Secondary | ICD-10-CM

## 2023-07-28 MED ORDER — CLONIDINE HCL 0.1 MG TABLET
0.1000 mg | ORAL_TABLET | Freq: Two times a day (BID) | ORAL | Status: DC
Start: 2023-07-28 — End: 2023-11-27

## 2023-07-28 NOTE — Procedures (Signed)
CARDIOLOGY [facility] [place]  [address] [place] [address]    Procedure Note    Name: Cynthia Barrera MRN:  [id number]   Date: 07/28/2023 DOB:  02-26-1941 (82 y.o.)         ECG - In Clinic (Non-[facility])    Performed by: Jori Moll, PA-C  Authorized by: Jori Moll, PA-C      Sinus rhythm, left ventricular hypertrophy.    Jori Moll, PA-C

## 2023-07-28 NOTE — Progress Notes (Signed)
Cardiology [facility], [facility]  [address] [place] [address]  [phone]    Cardiology  Clinic Note    Name: Cynthia Barrera   DOB: 1941-04-03  [82 y.o. female]   MRN: [id number]       Visit Date: 07/28/2023   Referring: Kellie Shropshire, DO  [address]  [address]  [place],  [place] [address]   PCP: Kellie Shropshire, DO         Chief Complaint: Hospital Follow Up      History of Present Illness   Veralynn Skubic is a 82 y.o. White female who presents for evaluation due to hypertension.  She has no history of coronary artery disease.  She has a history of paroxysmal atrial fibrillation and is maintaining sinus rhythm with amiodarone and is anticoagulated with Eliquis.  Nuclear study February 2020 was negative for ischemia or infarction with 74% ejection fraction.  She also has a history of parathyroid disease with previous parathyroidectomy and follows with Endocrinology.  She has chronic kidney disease and follows with nephrology.      Last month she had an episode of severe hypertension and was treated in the emergency room at [facility].  Cysts in her blood pressure has continued to run normal or slightly elevated.  She denies chest pain, shortness of breath, orthopnea, PND, palpitations, presyncope, syncope.  She has chronic dependent lower extremity edema.      EKG today: Sinus rhythm, left ventricular hypertrophy.    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    Insomnia 12/06/2019    CKD (chronic kidney disease), stage IV (CMS HCC) 12/06/2019    Chronic bilateral thoracic back pain 07/08/2018    Skin lesion 07/08/2018    OSA on CPAP 10/04/2015    B12 deficiency 06/22/2015    IDA (iron deficiency  anemia) 03/15/2015    Bilateral edema of lower extremity 10/10/2014    Fatigue 09/15/2013    Atrial fibrillation (CMS HCC) 04/08/2013    Hyperparathyroidism (CMS HCC) 03/18/2013    Vitamin D deficiency 09/17/2012    Glucose intolerance (impaired glucose tolerance) 09/17/2012    Essential hypertension 04/10/2000    Hypothyroidism 04/10/2000    Osteoporosis 04/10/2000     Left hip -2.7 2021, spine -1.8-  PCP started Fosomax 2019      HLD (hyperlipidemia) 09/19/1998       Allergies  Allergies   Allergen Reactions    Adhesive Rash     Needs tape for SENSITIVE SKIN    Bactrim [Sulfamethoxazole-Trimethoprim]     Ciprofloxacin     Digoxin     Plaquenil [Hydroxychloroquine]     Simvastatin     Sulfa (Sulfonamides)        Medications    Current Outpatient Medications:     acetaminophen (ARTHRITIS PAIN RELIEF) 650 mg Oral Tablet Sustained Release, Take 1 Tablet (650 mg total) by mouth Every 8 hours as needed for Pain, Disp: , Rfl:     amiodarone (PACERONE) 100 mg Oral Tablet, Take 1 Tablet (100 mg total) by mouth Once a day for 180 days (Patient taking differently: Take 1 Tablet (100 mg total) by mouth Once a  day Reports had "stopped taking it for 5 months and started back today due to high BP reading"), Disp: 90 Tablet, Rfl: 1    amLODIPine (NORVASC) 5 mg Oral Tablet, Take 1 Tablet (5 mg total) by mouth Once a day, Disp: , Rfl:     cholecalciferol, vitamin D3, 1,000 unit Oral Tablet, Take 2 Tablets (2,000 Units total) by mouth Twice daily, Disp: , Rfl:     cloNIDine HCL (CATAPRES) 0.1 mg Oral Tablet, Take 1 Tablet (0.1 mg total) by mouth Twice daily, Disp: , Rfl:     cyanocobalamin (VITAMIN B 12) 1,000 mcg Oral Tablet, Take 1 Tablet (1,000 mcg total) by mouth Once a day, Disp: , Rfl:     ELIQUIS 2.5 mg Oral Tablet, Take 1 tablet by mouth twice daily, Disp: 180 Tablet, Rfl: 3    levothyroxine (SYNTHROID) 75 mcg Oral Tablet, Take 1 Tab (75 mcg total) by mouth Every morning (Patient taking differently: Take 88 mcg by mouth  Every morning), Disp: 90 Tab, Rfl: 4    lisinopriL (PRINIVIL) 40 mg Oral Tablet, Take 1 Tablet (40 mg total) by mouth Once a day, Disp: , Rfl:     polysaccharide iron complex (FERREX 150) 150 mg iron Oral Capsule, Take 1 capsule by mouth once daily, Disp: 90 Capsule, Rfl: 3    History  Past Medical History:   Diagnosis Date    Anemia     Atrial fibrillation (CMS HCC)     CKD (chronic kidney disease)     Diverticulosis     Essential hypertension     H/O urinary frequency     History of fibrocystic disease of breast     History of nephrolithiasis     History of osteoporosis     History of retinopathy     Hypertension     Kidney stones     Lichen sclerosus et atrophicus 2016    left labial/periclitoral hood punch bx-tx with clobetasol    Mixed hyperlipidemia     Osteoporosis     -2.7 left hip, improved 2021, -1.8 spine    Paroxysmal atrial fibrillation (CMS HCC)     Rheumatoid arthritis (CMS HCC)     Sleep apnea     Thyroid disease     Ventricular ectopy          Past Surgical History:   Procedure Laterality Date    HX BREAST BIOPSY  2002    FNA x 3 bilaterally    HX CYST INCISION AND DRAINAGE      about 2000    HX HYSTERECTOMY  1980    TAH    HX PARATHYROIDECTOMY      HX PARTIAL THYROIDECTOMY      parathyroid right lube    HX TONSIL AND ADENOIDECTOMY      HX TUBAL LIGATION      HX WRIST FRACTURE TX Left     about 2004      Social History     Socioeconomic History    Marital status: Married   Tobacco Use    Smoking status: Never    Smokeless tobacco: Never   Vaping Use    Vaping status: Never Used   Substance and Sexual Activity    Alcohol use: No     Alcohol/week: 0.0 standard drinks of alcohol    Drug use: No    Sexual activity: Not Currently     Birth control/protection: Post-menopausal, Surgical     Family Medical History:  Problem Relation (Age of Onset)    Breast Cancer Daughter (59)    Diabetes Brother, Sister, Daughter    Hypertension (High Blood Pressure) Mother, Brother, Sister    Ovarian Cancer Maternal  cousin (72)    Stroke Maternal Grandmother    Thyroid Disease Brother, Sister              Review of Systems:  Review of Systems   Constitutional: Negative for chills and fever.   Cardiovascular:  Positive for leg swelling. Negative for chest pain, near-syncope, orthopnea, palpitations, paroxysmal nocturnal dyspnea and syncope.   Respiratory:  Negative for cough and shortness of breath.    Endocrine: Negative for cold intolerance and heat intolerance.   Hematologic/Lymphatic: Negative for bleeding problem.   Musculoskeletal:  Positive for arthritis. Negative for gout.   Gastrointestinal:  Negative for abdominal pain, nausea and vomiting.   Neurological:  Negative for dizziness and weakness.   Psychiatric/Behavioral:  Negative for depression. The patient is not nervous/anxious.           Physical Examination:  BP (!) 150/74   Ht 1.6 m (5\' 3" )   Wt 77.1 kg (170 lb)   LMP 12/01/1980   SpO2 95%   BMI 30.11 kg/m         Physical Exam  Constitutional:       General: She is not in acute distress.  Neck:      Vascular: No carotid bruit.      Comments: No JVD  Cardiovascular:      Rate and Rhythm: Normal rate and regular rhythm.      Heart sounds: No murmur heard.  Pulmonary:      Effort: No respiratory distress.      Breath sounds: No wheezing, rhonchi or rales.   Abdominal:      General: There is no distension.      Palpations: Abdomen is soft.   Musculoskeletal:         General: Swelling (Trace bilateral lower extremity edema) present.   Neurological:      General: No focal deficit present.      Mental Status: She is alert.   Psychiatric:         Behavior: Behavior normal.             Orders Placed This Encounter    ECG - In Clinic (Non-[facility])    PULMONARY FUNCTION TESTING-ADULT       There are no discontinued medications.    Assessment and Plan:  Assessment/Plan   1. Paroxysmal atrial fibrillation (CMS HCC)    2. Essential hypertension    3. Mixed hyperlipidemia    4. Nonsmoker    5. CKD (chronic kidney disease),  stage IV (CMS HCC)    6. Hypothyroidism, unspecified type    7. On amiodarone therapy    8. OSA on CPAP        Ms. Parke has no history of coronary disease and no anginal symptoms.  She is in sinus rhythm with no symptoms suggesting recurrent atrial fibrillation or other significant arrhythmia.  She is on amiodarone.  Thyroid function is followed regularly by her primary care doctor.  She has not had pulmonary function testing for quite some time.  We will arrange for pulmonary function testing now and every 6 months while on amiodarone.  Blood pressure does not appear to be adequately controlled.  We will increase her clonidine to 0.1 mg twice daily with extra as needed for blood pressure greater  than 160/100.  If her blood pressure gets too low we will reduce or eliminate amlodipine which may help her edema as well.  They will continue to monitor blood pressure at home and contact us, her primary care doctor or Nephrology if not adequately controlled.  They were asked to contact us if there are any changes in her cardiac status.  She will follow up in 1 year or sooner if needed.      Thank you for allowing [place] to participate in care of your patient.  If we can be of further assistance in her care at any time please let us know.      Sincerely,     Billie Lade, PA-C      Follow up:  Return in about 1 year (around 07/27/2024).        Jori Moll, PA-C  [facility]  Cardiology  [facility]    A portion of this documentation may have been generated using [facility] [employer] voice recognition software and may contain syntax/voice recognition errors.

## 2023-08-24 ENCOUNTER — Other Ambulatory Visit: Payer: Self-pay

## 2023-08-24 ENCOUNTER — Inpatient Hospital Stay
Admission: RE | Admit: 2023-08-24 | Discharge: 2023-08-24 | Disposition: A | Discharge status: Home / Self Care | Payer: Medicare Other | Source: Ambulatory Visit | Attending: Physician Assistant | Admitting: Physician Assistant

## 2023-08-24 DIAGNOSIS — I480 Paroxysmal atrial fibrillation: Secondary | ICD-10-CM | POA: Insufficient documentation

## 2023-08-24 DIAGNOSIS — Z79899 Other long term (current) drug therapy: Secondary | ICD-10-CM | POA: Insufficient documentation

## 2023-08-24 MED ORDER — ALBUTEROL SULFATE 2.5 MG/3 ML (0.083 %) SOLUTION FOR NEBULIZATION
2.5000 mg | INHALATION_SOLUTION | Freq: Once | RESPIRATORY_TRACT | Status: AC
Start: 2023-08-24 — End: 2023-08-24
  Administered 2023-08-24: 2.5 mg via RESPIRATORY_TRACT
  Filled 2023-08-24: qty 3

## 2023-08-26 ENCOUNTER — Other Ambulatory Visit (HOSPITAL_COMMUNITY): Payer: Self-pay | Admitting: Physician Assistant

## 2023-08-26 DIAGNOSIS — I480 Paroxysmal atrial fibrillation: Secondary | ICD-10-CM

## 2023-08-26 DIAGNOSIS — Z79899 Other long term (current) drug therapy: Secondary | ICD-10-CM

## 2023-08-26 NOTE — Procedures (Signed)
NAME:  DEBORRAH, SCHONER [facility]  HOSPITAL NUMBER:  [id number]  DATE OF SERVICE:  08/26/2023  DOB:  [birth year]  SEX:  F      CONCLUSION:  Restrictive lung disease.        Nanci Pina, MD            DD:  08/25/2023 06:04:17  DT:  08/26/2023 06:16:47 TAW  D#:  [id number]

## 2023-09-04 NOTE — Result Encounter Note (Signed)
Results to pt over telephone.  Tamer Baughman, LPN

## 2023-09-15 ENCOUNTER — Other Ambulatory Visit: Payer: Self-pay | Admitting: Internal Medicine

## 2023-09-16 ENCOUNTER — Encounter (INDEPENDENT_AMBULATORY_CARE_PROVIDER_SITE_OTHER): Payer: Self-pay

## 2023-09-16 LAB — T4, FREE: Free T4: 2.02 ng/dL — ABNORMAL HIGH (ref 0.82–1.77)

## 2023-09-16 LAB — VITAMIN D 25 HYDROXY (VIT D DEFICIENCY, FRACTURES): Vit D, 25-Hydroxy: 65.8 ng/mL (ref 30.0–100.0)

## 2023-09-16 LAB — TSH: TSH: 3.53 u[IU]/mL (ref 0.450–4.500)

## 2023-09-24 ENCOUNTER — Other Ambulatory Visit (INDEPENDENT_AMBULATORY_CARE_PROVIDER_SITE_OTHER): Payer: Self-pay | Admitting: Family Medicine

## 2023-09-25 ENCOUNTER — Encounter: Payer: Self-pay | Admitting: Internal Medicine

## 2023-09-25 ENCOUNTER — Ambulatory Visit (INDEPENDENT_AMBULATORY_CARE_PROVIDER_SITE_OTHER): Payer: Medicare Other | Admitting: Internal Medicine

## 2023-09-25 VITALS — BP 150/80 | HR 66 | Ht 63.0 in | Wt 169.0 lb

## 2023-09-25 DIAGNOSIS — E559 Vitamin D deficiency, unspecified: Secondary | ICD-10-CM | POA: Diagnosis not present

## 2023-09-25 DIAGNOSIS — E039 Hypothyroidism, unspecified: Secondary | ICD-10-CM

## 2023-09-25 DIAGNOSIS — M810 Age-related osteoporosis without current pathological fracture: Secondary | ICD-10-CM

## 2023-09-25 DIAGNOSIS — E210 Primary hyperparathyroidism: Secondary | ICD-10-CM

## 2023-09-25 DIAGNOSIS — Z23 Encounter for immunization: Secondary | ICD-10-CM | POA: Diagnosis not present

## 2023-09-25 MED ORDER — LEVOTHYROXINE SODIUM 88 MCG PO TABS: 88.0000 ug | ORAL_TABLET | Freq: Every day | ORAL | 3 refills | Status: DC

## 2023-09-25 MED ORDER — ALENDRONATE SODIUM 35 MG PO TABS: 35.0000 mg | ORAL_TABLET | ORAL | 3 refills | Status: DC

## 2023-09-25 MED ORDER — AMIODARONE 100 MG TABLET
100.0000 mg | ORAL_TABLET | Freq: Every day | ORAL | 1 refills | Status: DC
Start: 2023-09-25 — End: 2024-04-04

## 2023-09-25 NOTE — Progress Notes (Signed)
Patient ID: Teresa Caldwell, female   DOB: 82 y.o.    [birth year], 82 y.o.   MRN: [id number]   HPI  Teresa Caldwell is a 82 y.o.-year-old female, initially referred by her cardiologist, Dr. Gala Caldwell (previously saw Dr. 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 She was seeing endocrinology in [place], [place], Dr. Bing Caldwell, now retired.  Last visit with me 1 year ago.  She is here with her husband who offers part of the history especially related to her past medical history, symptoms, medications.  Interim history: No falls or fractures since last visit.   She also continues to have problems with controlling BP and also has paroxysmal A-fib. She was in the ED 2x, last with sBP 212. The first time she was off all of her antihypertensives, the second time, she was on BP medicines, increased since then.  Hypercalcemia  -Diagnosed in 2004 - was followed by her endocrinologist in [place]. They were following the levels of the calcium and PTH.  Her calcium started to increase in 10/2016.  She had right inferior parathyroidectomy on 07/18/2019 with Dr. Gerrit Caldwell.  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  Calcium level postop was 8.9, PTH slightly high at 74.  She felt better after surgery, with less fatigue.  Reviewed pertinent labs: 07/07/2023: Calcium 9.2, PTH 33 07/23/2022 calcium 9.1 (8.7-10.3), albumin 4.2 09/20/2021: Calcium 9.2 05/01/2021: Calcium 9.5, PTH 50 Lab Results  Component Value Date   PTH 36 09/17/2020   PTH 71 (H) 09/12/2019   PTH Comment 09/12/2019   PTH 118 (H) 08/27/2018   PTH 45 02/25/2018   PTH 76 (H) 04/13/2017   CALCIUM 9.9 09/17/2020   CALCIUM 9.3 09/12/2019   CALCIUM 8.4 (L) 07/19/2019   CALCIUM 9.9 07/14/2019   CALCIUM 10.4 08/27/2018   CALCIUM 9.1 02/25/2018   CALCIUM 10.4 04/13/2017   CALCIUM 11.1 (H) 01/02/2017  07/08/2018 records from PCP: Ca 10.9 (8.5-10.1), PTH 157 (18-80) 07/13/2017: PTH 210, calcium: 10.4  (8.5-10.1) 12/22/2016: Calcium 10.8 (8.5-10.1), iCa 5.91 (4.57-5.43) PTH 256 12/17/2016: Calcium 10.4 (8.5-10.1), Mg  06/25/2015: iCa 1.43 (1.12-1.32), PTH 159  24-hour urine calcium was normal (but slightly low creatinine): Component     Latest Ref Rng & Units 05/14/2017  Calcium, Ur     Not estab mg/dL 8  Calcium, 24 hour urine     35 - 250 mg/24 h 192  Creatinine, Urine     20 - 320 mg/dL 24  Creatinine, 08M Ur     0.63 - 2.50 g/24 h 0.58 (L)   Osteoporosis:  Reviewed most recent T-scores: 10/31/2022 [facility]): L1-L4: -1.5 (+3%) Left total hip: -2.8 (-2.8%)  10/29/2020: L1-L4: -1.6 (+6.1%*) Left total hip: -2.7 (+5.7%) Femoral neck: -2.7  07/13/2018:  L spine -2.2 (-5.7%*) L hip: -2.9 (no change)  She started Fosamax in 07/2018.  She was on 35 mg weekly >> almost 1 year ago.  No fractures.  She has a history of 3 kidney stones, last in 2015, for which she had lithotripsy.  + CKD she sees nephrology; last eval:   Lab Results  Component Value Date   BUN 30 (H) 09/17/2020   BUN 31 (H) 07/19/2019   CREATININE 2.16 (H) 09/17/2020   CREATININE 1.88 (H) 07/19/2019  05/2020: GFR 31 reportedly 09/2019:  GFR 20 07/08/2018: 26/1.6 02/03/2018:  BUN/Cr 25/1.7, GFR 35 12/22/2016: 20/1.4, EGFR 37 12/17/2016: 18/1.5, EGFR 34  She stopped HCTZ in 10/2016.  Vitamin D deficiency:  Most recent vitamin D level was normal: Lab Results  Component  Value Date   VD25OH 65.8 09/15/2023   VD25OH 62.7 09/15/2022   VD25OH 98.42 09/23/2021   VD25OH 57.1 09/17/2020   VD25OH 76.82 09/12/2019   VD25OH 69.16 08/27/2018   VD25OH 59.73 02/25/2018   VD25OH 46.04 04/13/2017   VD25OH 25.93 (L) 02/06/2017  07/13/2017: 43.3 12/17/2016: Vitamin D 22.8 06/25/2015: Vitamin D 19.2 Her cardiologist added a vitamin D 2000 IU/day >> we increased the dose to 4000 units in 01/2017. In 08/2021, I advised her to decrease the dose back to 2000 units daily.  She takes this dose now.  Pt does  not have a FH of hypercalcemia, pituitary tumors. Mother and sister had osteoporosis.  Sister and daughter had thyroid cancer.  Hypothyroidism  -Apparently diagnosed before starting amiodarone.  She was able to reduce the dose of levothyroxine when her amiodarone dose was reduced in 2018  She is now taking levothyroxine 88 mcg daily (increased 08/2021): - in am - fasting - at least 30 min from b'fast - no Ca, MVI, PPIs, + Fe at night - not on Biotin  Reviewed her TFTs: Lab Results  Component Value Date   TSH 3.530 09/15/2023   TSH 5.140 (H) 09/15/2022   TSH 5.74 (H) 09/23/2021   TSH 6.24 (H) 09/17/2020   TSH 3.47 09/12/2019   TSH 2.05 02/25/2018   TSH 1.35 04/13/2017   TSH 3.59 02/06/2017   FREET4 2.02 (H) 09/15/2023   FREET4 1.96 (H) 09/15/2022   FREET4 1.30 09/23/2021   FREET4 1.34 09/17/2020   FREET4 1.31 09/12/2019   FREET4 1.58 02/25/2018   FREET4 1.45 04/13/2017   FREET4 1.40 02/06/2017  07/13/2017: 1.07 12/17/2016: TSH 1.69  07/13/2017: TPO Abs <28  She was on Plaquenil for RA. She [facility] Op[facility]mology [facility] 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  She and her husband are driving from 5 hours away to come see their daughter.  ROS: + See HPI  I reviewed pt's medications, allergies, PMH, social hx, family hx, and changes were documented in the history of present illness. Otherwise, unchanged from my initial visit note.  Patient Active Problem List   Diagnosis Date Noted   Vitamin D insufficiency 02/06/2017    Priority: High   Hypothyroidism 02/06/2017    Priority: High   Hyperparathyroidism, primary (HCC) 02/06/2017    Priority: High   Osteoporosis 02/06/2017    Priority: High   Primary hyperparathyroidism (HCC) 07/18/2019   Glucose intolerance (impaired glucose tolerance) 02/06/2017   Essential hypertension 02/06/2017   Atrial fibrillation (HCC) 02/06/2017   OSA on CPAP 02/06/2017   B12 deficiency  02/06/2017   Hyperlipidemia 02/06/2017   IDA (iron deficiency anemia) 02/06/2017   Past Surgical History:  Procedure Laterality Date   TONSILLECTOMY  1956    WRIST ARTHROSCOPY Left 1994   Also: - Hysterectomy 1989 - Lithotripsy x3 - Cataract L and R 2018   Social History   Social History   Marital status: Married    Spouse name: N/A   Number of children: 5 (1 daughter lives in [place])   Occupational History   n/a   Social History Main Topics   Smoking status: Never Smoker   Smokeless tobacco: Never Used   Alcohol use No   Drug use: No   Current Outpatient Medications on File Prior to Visit  Medication Sig Dispense Refill   alendronate (FOSAMAX) 35 MG tablet Take 35 mg by mouth every 7 (seven) days.  3   amiodarone (PACERONE) 100  MG tablet Take 100 mg by mouth every morning.     amLODipine (NORVASC) 5 MG tablet Take 5 mg by mouth daily.     Cholecalciferol (VITAMIN D-3 PO) Take 2,000 Units by mouth 2 (two) times daily.      cloNIDine (CATAPRES) 0.1 MG tablet Take by mouth.     ELIQUIS 2.5 MG TABS tablet Take 2.5 mg by mouth 2 (two) times daily.     iron polysaccharides (NIFEREX) 150 MG capsule Take 150 mg by mouth every evening.     levothyroxine (SYNTHROID) 88 MCG tablet TAKE 1 TABLET BY MOUTH ONCE DAILY BEFORE BREAKFAST 90 tablet 0   lisinopril (PRINIVIL,ZESTRIL) 40 MG tablet Take 20 mg by mouth every morning.      metoCLOPramide (REGLAN) 10 MG tablet Take 0.5 tablets (5 mg total) by mouth every 6 (six) hours as needed for nausea. 4 tablet 0   traZODone (DESYREL) 50 MG tablet Take 25-50 mg by mouth at bedtime as needed for sleep.     vitamin B-12 (CYANOCOBALAMIN) 1000 MCG tablet Take 1,000 mcg by mouth daily.     No current facility-administered medications on file prior to visit.   Allergies  Allergen Reactions   Bactrim [Sulfamethoxazole-Trimethoprim] Dermatitis    blisters   Ciprofloxacin Hives   Digoxin And Related Hives    And blisters (eyes & lips)    Sulfa Antibiotics Hives   Tape Other (See Comments)    Needs tape for SENSITIVE SKIN   Clindamycin/Lincomycin Rash   Family History  Problem Relation Age of Onset   Hypertension Mother    Heart disease Mother    Diabetes Child    Hypertension Child    Thyroid disease Child    Cancer Child    PE: BP (!) 150/80   Pulse 66   Ht 5\' 3"  (1.6 m)   Wt 169 lb (76.7 kg)   LMP  (LMP Unknown)   SpO2 98%   BMI 29.94 kg/m  Wt Readings from Last 3 Encounters:  09/25/23 169 lb (76.7 kg)  09/24/22 168 lb 6.4 oz (76.4 kg)  09/23/21 173 lb 9.6 oz (78.7 kg)   Constitutional: overweight, in NAD Eyes:  EOMI, no exophthalmos ENT: no neck masses, no cervical lymphadenopathy Cardiovascular: RRR, No MRG Respiratory: CTA B Musculoskeletal: no deformities Skin:no rashes Neurological: no tremor with outstretched hands  Assessment: 1. Hypercalcemia/hyperparathyroidism -+ History of complications from hypercalcemia: Nephrolithiasis, osteoporosis, no fractures.   2. Vit D insufficiency  3. Hypothyroidism  4.  Osteoporosis  Fax results to Dr. Royston Bake (fax [phone]) and Dr. Thedore Mins.   Plan: 1. And 2.   Patient with a history of elevated calcium (high at 11.1) also very high intact PTH, at 256 (for a calcium level of 10.8).  24-hour urine calcium was normal, as was her vitamin D.  Biochemical investigation pointing towards primary hyperparathyroidism. -She has a history of nephrolithiasis and osteoporosis, both possible complications from hyperparathyroidism -I referred her to surgery and she had right inferior parathyroidectomy by Dr. Gerrit Caldwell on 07/18/2019 -She feels well after surgery, without complaints -She has stage IV CKD.  Nephrology is checking her calcium and PTH levels.  Calcium was normal, at 9.2 in 07/2023.  PTH was 33 at that time. -she is on 2000 units vitamin D daily, which we will continue -We reviewed her vitamin D level from earlier this month and the level was normal -I will  see the patient back in 1 year  3. Hypothyroidism - latest thyroid labs reviewed  with pt. >> normal 10 days ago: Lab Results  Component Value Date   TSH 3.530 09/15/2023  - she continues on LT4 88 mcg daily - pt feels good on this dose. - we discussed about taking the thyroid hormone every day, with water, >30 minutes before breakfast, separated by >4 hours from acid reflux medications, calcium, iron, multivitamins. Pt. is taking it correctly. - I refilled her LT4 dose  4.  Osteoporosis -No fractures or falls -at last OV, she was on low-dose Fosamax 35 mg weekly (lower dose due to renal disease) -she has been on this for 4 years at that time.  However, after our last visit, she was advised by PCP to stop the Fosamax.  Since her bone density is still in the osteoporotic range, I would suggest to restart this and continue at least until the next bone density but possibly longer.  Fosamax is approved for 5 to 10 years, depending on the fracture risk and bone density -Reviewed bone density scans from 2023 in 2021 and the bone density at the spine appeared to have improved, possibly due to parathyroid surgery, while but the bone density at the hip remains approximately stable. -Will continue to keep an eye on her bone density scans-next is due next year  + Flu shot today  For results, call daughter Lawson Fiscal: [phone]  Carlus Pavlov, MD PhD [facility]

## 2023-09-25 NOTE — Patient Instructions (Addendum)
Please restart: - Fosamax 35 mg weekly  Please continue vitamin D 2000 units daily.  Continue Levothyroxine 88 mcg daily.  Take the thyroid hormone every day, with water, at least 30 minutes before breakfast, separated by at least 4 hours from: - acid reflux medications - calcium - iron - multivitamins  Please come back for a follow-up appointment in 1 year.

## 2023-09-29 ENCOUNTER — Other Ambulatory Visit (INDEPENDENT_AMBULATORY_CARE_PROVIDER_SITE_OTHER): Payer: Self-pay | Admitting: Physician Assistant

## 2023-09-29 MED ORDER — AMLODIPINE 5 MG TABLET: 5.0000 mg | ORAL_TABLET | Freq: Every day | ORAL | 3 refills | Status: AC

## 2023-11-27 ENCOUNTER — Other Ambulatory Visit (INDEPENDENT_AMBULATORY_CARE_PROVIDER_SITE_OTHER): Payer: Self-pay | Admitting: Family Medicine

## 2023-11-27 DIAGNOSIS — I10 Essential (primary) hypertension: Secondary | ICD-10-CM

## 2023-11-27 MED ORDER — CLONIDINE HCL 0.1 MG TABLET
0.1000 mg | ORAL_TABLET | Freq: Two times a day (BID) | ORAL | 3 refills | Status: DC
Start: 2023-11-27 — End: 2024-10-04

## 2024-04-04 ENCOUNTER — Other Ambulatory Visit (INDEPENDENT_AMBULATORY_CARE_PROVIDER_SITE_OTHER): Payer: Self-pay | Admitting: Family Medicine

## 2024-04-04 MED ORDER — AMIODARONE 100 MG TABLET
100.0000 mg | ORAL_TABLET | Freq: Every day | ORAL | 1 refills | Status: DC
Start: 2024-04-04 — End: 2024-10-04

## 2024-07-28 ENCOUNTER — Encounter (INDEPENDENT_AMBULATORY_CARE_PROVIDER_SITE_OTHER)

## 2024-07-28 ENCOUNTER — Encounter (INDEPENDENT_AMBULATORY_CARE_PROVIDER_SITE_OTHER): Payer: Self-pay

## 2024-08-05 ENCOUNTER — Other Ambulatory Visit (INDEPENDENT_AMBULATORY_CARE_PROVIDER_SITE_OTHER): Payer: Self-pay | Admitting: Family Medicine

## 2024-08-05 DIAGNOSIS — I480 Paroxysmal atrial fibrillation: Secondary | ICD-10-CM

## 2024-08-30 ENCOUNTER — Telehealth: Payer: Self-pay | Admitting: Internal Medicine

## 2024-08-30 ENCOUNTER — Other Ambulatory Visit: Payer: Self-pay

## 2024-08-30 DIAGNOSIS — E559 Vitamin D deficiency, unspecified: Secondary | ICD-10-CM

## 2024-08-30 DIAGNOSIS — E039 Hypothyroidism, unspecified: Secondary | ICD-10-CM

## 2024-08-30 NOTE — Telephone Encounter (Signed)
 Labs ordered and mailed, Pt informed.

## 2024-08-30 NOTE — Telephone Encounter (Signed)
 Patient's spouse, Alexzandra Bilton, is calling to say that Iyonnah will be getting her labs done at a hospital in [place]  called [facility] instead of here.  Dalayah has an appointment with Dr. Trixie on Monday, 09/26/2024.  Nancyann is asking that the lab orders be mailed to their home address:  [address] [place] [phone]

## 2024-09-08 ENCOUNTER — Telehealth (HOSPITAL_BASED_OUTPATIENT_CLINIC_OR_DEPARTMENT_OTHER): Payer: Self-pay | Admitting: Family

## 2024-09-08 NOTE — Telephone Encounter (Signed)
 Called pt to set up new pt appt from faxed referral.     LVM 08/29/24  LVM 09/01/24  LVM 09/05/24  Pt called back 09/06/24 they have an appt with pain mgmt in [place] on 09/14/24  scheduled since 07/20/24 and they are just going to keep that appt. Wants to cancel this referral. TD

## 2024-09-12 LAB — LAB REPORT - SCANNED: Free T4: 1.47

## 2024-09-13 ENCOUNTER — Ambulatory Visit: Payer: Self-pay | Admitting: Internal Medicine

## 2024-09-14 ENCOUNTER — Ambulatory Visit: Payer: Self-pay | Attending: PAIN MANAGEMENT | Admitting: PAIN MANAGEMENT

## 2024-09-14 ENCOUNTER — Other Ambulatory Visit: Payer: Self-pay

## 2024-09-14 ENCOUNTER — Encounter (HOSPITAL_BASED_OUTPATIENT_CLINIC_OR_DEPARTMENT_OTHER): Payer: Self-pay | Admitting: PAIN MANAGEMENT

## 2024-09-14 ENCOUNTER — Ambulatory Visit (HOSPITAL_BASED_OUTPATIENT_CLINIC_OR_DEPARTMENT_OTHER)
Admission: RE | Admit: 2024-09-14 | Discharge: 2024-09-14 | Disposition: A | Source: Ambulatory Visit | Attending: PAIN MANAGEMENT

## 2024-09-14 VITALS — BP 200/95 | HR 77 | Ht 65.0 in | Wt 167.0 lb

## 2024-09-14 DIAGNOSIS — G894 Chronic pain syndrome: Secondary | ICD-10-CM

## 2024-09-14 DIAGNOSIS — M791 Myalgia, unspecified site: Secondary | ICD-10-CM

## 2024-09-14 DIAGNOSIS — M542 Cervicalgia: Secondary | ICD-10-CM

## 2024-09-14 DIAGNOSIS — M4722 Other spondylosis with radiculopathy, cervical region: Secondary | ICD-10-CM

## 2024-09-14 DIAGNOSIS — M47814 Spondylosis without myelopathy or radiculopathy, thoracic region: Secondary | ICD-10-CM

## 2024-09-14 DIAGNOSIS — M47812 Spondylosis without myelopathy or radiculopathy, cervical region: Secondary | ICD-10-CM | POA: Insufficient documentation

## 2024-09-14 DIAGNOSIS — M5412 Radiculopathy, cervical region: Secondary | ICD-10-CM | POA: Insufficient documentation

## 2024-09-14 LAB — URINE DRUG SCREEN-PAIN CLINIC (NO THC)
AMPHETAMINES URINE: NEGATIVE
BARBITURATES URINE: NEGATIVE
BENZODIAZEPINES URINE: NEGATIVE
BUPRENORPHINE URINE: NEGATIVE
COCAINE METABOLITES URINE: NEGATIVE
CREAT-ADULTERATION: 94.8 mg/dL (ref 22.0–250.0)
FENTANYL, URINE: NEGATIVE
HEROIN URINE: NEGATIVE
METHADONE URINE: NEGATIVE
OPIATES URINE: NEGATIVE
OXYCODONE URINE: NEGATIVE
PH-ADULTERATION: 6.2 (ref 4.5–8.0)

## 2024-09-14 NOTE — Telephone Encounter (Signed)
 I called and spoke with the Lab (where she had the T4) done at and they only did a vitamin d  and T4 no TSH

## 2024-09-14 NOTE — Nursing Note (Addendum)
-  New patient referred by Odella Pat with [facility] for back/neck pain, osteoarthritis, eval for steroid injections/pain management -Per referral    The patient reports that the onset of pain was about 2 years ago. She denies injury at the time of the onset of pain.    -Complains of neck pain. Described as Daily with Fluctuations sharp and stabbing. Extends down into upper back.    Pt denies numbness and tingling in BUE, as well as weakness.    -ATTN: Manual Blood Pressure 200/95-    PAIN SCALE: 4 on a scale of 0-10  Lowest pain in the past week: 4/10  Highest pain in the past week: 9/10    -Pain increases with prolonged standing/walking, cooking/household chores, bending    -Pain eases with sitting especially reclined in a recliner, rest, OTC tylenol  arthritis, heat application      Lawence Fell, RN        IMAGING: No Imaging Available           09/14/2024   NECK DISABILITY INDEX   Pain Intensity 1   Personal Care (Washing, Dressing, etc.) 1   Lifting 4   Reading 2   Headaches 0   Concentration 0   Work 4   Driving 5   Sleeping 4   Recreation 5   Score 26   Neck Disability Percentage 52           09/14/2024   Conservative Therapies   Type of Injury None   Has the patient participated in Physical Therapy? No   Has the patient participated in Chiropractic Manipulation? Yes   Dates of chiropractic manipulation June-Aug 2025   Actively participating in chiropractic manipulation No   Any relief with chiropractic manipulation No   Has the patient participated in Home Exercise? Yes   Dates of home exercise Daily   Actively participating in home exercise Yes   Any relief with home exercise No   Additional Previous Treatments None   Previous Medications Acetaminophen    Surgical Eval No     ,       09/14/2024     9:00 AM   Opioid Risk Assessment   Family History Illegal Drug Abuse 0   Family History of Prescription Drug Abuse 0   Personal History of Illegal Drug Use 0   Age 8-77 years old 0   Depression  Diagnosis 0

## 2024-09-14 NOTE — H&P (Signed)
 PAIN MANAGEMENT, [facility]  [address]  [place] [address]  Operated by [facility]  History and Physical    Name: Cynthia Barrera MRN:  [id number]   Date: 09/14/2024 DOB:  13-May-1941 (83 y.o.)               Provider: Evalene JONELLE Roche, MD  PCP: Delon Silversmith, DO  Referring Provider: Delon Silversmith     Reason for visit: New Patient      Objective:  Nursing Notes:   Wonda Alcon, RN  09/14/24 9040  Addendum    -New patient referred by Odella Pat with [facility] for back/neck pain, osteoarthritis, eval for steroid injections/pain management -Per referral    The patient reports that the onset of pain was about 2 years ago. She denies injury at the time of the onset of pain.    -Complains of neck pain. Described as Daily with Fluctuations sharp and stabbing. Extends down into upper back.    Pt denies numbness and tingling in BUE, as well as weakness.    -ATTN: Manual Blood Pressure 200/95-    PAIN SCALE: 4 on a scale of 0-10  Lowest pain in the past week: 4/10  Highest pain in the past week: 9/10    -Pain increases with prolonged standing/walking, cooking/household chores, bending    -Pain eases with sitting especially reclined in a recliner, rest, OTC tylenol  arthritis, heat application      Alcon Wonda, RN        IMAGING: No Imaging Available           09/14/2024   NECK DISABILITY INDEX   Pain Intensity 1   Personal Care (Washing, Dressing, etc.) 1   Lifting 4   Reading 2   Headaches 0   Concentration 0   Work 4   Driving 5   Sleeping 4   Recreation 5   Score 26   Neck Disability Percentage 52           09/14/2024   Conservative Therapies   Type of Injury None   Has the patient participated in Physical Therapy? No   Has the patient participated in Chiropractic Manipulation? Yes   Dates of chiropractic manipulation June-Aug 2025   Actively participating in chiropractic manipulation No   Any relief with chiropractic manipulation No   Has the patient  participated in Home Exercise? Yes   Dates of home exercise Daily   Actively participating in home exercise Yes   Any relief with home exercise No   Additional Previous Treatments None   Previous Medications Acetaminophen    Surgical Eval No     ,       09/14/2024     9:00 AM   Opioid Risk Assessment   Family History Illegal Drug Abuse 0   Family History of Prescription Drug Abuse 0   Personal History of Illegal Drug Use 0   Age 51-34 years old 0   Depression Diagnosis 0        HPI:  The patient presents to the office for an initial evaluation as new patient  A comprehensive history was obtained, and a physical examination was performed  She complains of Neck and upper back pain between shoulder blades. Denies numbness or tingling of arms.  She reports neck pain off and on for 10 years   Denies low back pain at this time   Denies shoulder pain   She reports spending 8  hours a day in chair due to the pain   Patient's QOL and ability to complete ADL's continued to worsen, thus leading to worsening pain and decreased function   Unable to cook or perform household chores without pain   No recent imaging   History of MRI of brain in 2024   She is legally blind  She has significant arthritis of both hands   Recommend that she follow up with PCP for her blood pressure and she voiced understanding. She reports BP has been high for several weeks  History of A fib - on Eliquis . Cardiologist is Dr. Maurie   Treated by chiropractor June to August 2025   Patient has tried and failed > 6 months of conservative treatment without significant improvement in pain or function (e.g., exercise, activity modification, physical methods including PT and/or chiropractic care, nonsteroidal anti-inflammatory drugs and/or muscle relaxants)    Recommend MRI of cervical spine to evaluate persistent neck pain with weakness of BUE since conservative treatment has failed to provide relief. She denies reason that prevents her from having a MRI.   We  did give her a physical therapy order to teach home exercise program to improve strength to avoid her from becoming weaker   New x-ray of cervical spine revealed multilevel facet arthropathy  Initial discussion of potential treatment options but additional imaging is required for treatment planning.  Will revisit detailed treatment options once imaging complete            Past Medical History:  Past Medical History:   Diagnosis Date    Anemia     Arthritis     Atrial fibrillation     CKD (chronic kidney disease)     Degenerative joint disease     Diverticulosis     Essential hypertension     H/O urinary frequency     History of fibrocystic disease of breast     History of nephrolithiasis     History of osteoporosis     History of retinopathy     Hypertension     Kidney stones     Lichen sclerosus et atrophicus 2016    left labial/periclitoral hood punch bx-tx with clobetasol    Mixed hyperlipidemia     Osteoporosis     -2.7 left hip, improved 2021, -1.8 spine    Paroxysmal atrial fibrillation (CMS HCC)     Rheumatoid arthritis     Sleep apnea     Thyroid  disease     Ventricular ectopy      Past Surgical History:   Procedure Laterality Date    HX BREAST BIOPSY  2002    FNA x 3 bilaterally    HX CYST INCISION AND DRAINAGE      about 2000    HX HYSTERECTOMY  1980    TAH; Partial    HX PARATHYROIDECTOMY      HX PARTIAL THYROIDECTOMY      parathyroid  right lube    HX TONSIL AND ADENOIDECTOMY      HX TONSIL AND ADENOIDECTOMY      HX TUBAL LIGATION      HX WRIST FRACTURE TX Left     about 2004     LITHOTRIPSY        Allergies[1]  Current Outpatient Medications   Medication Sig    ACCRUFER 30 mg Oral Capsule Take 1 Capsule (30 mg total) by mouth Twice daily    acetaminophen  (ARTHRITIS PAIN RELIEF) 650 mg Oral Tablet Sustained Release Take  1 Tablet (650 mg total) by mouth Every 8 hours as needed for Pain    Alendronate  (FOSAMAX ) 35 mg Oral Tablet Take 1 Tablet (35 mg total) by mouth Every 7 days    amiodarone  (PACERONE ) 100  mg Oral Tablet Take 1 Tablet (100 mg total) by mouth Daily for 180 days    amLODIPine  (NORVASC) 5 mg Oral Tablet Take 1 Tablet (5 mg total) by mouth Once a day (Patient taking differently: Take 2 Tablets (10 mg total) by mouth Daily)    cholecalciferol, vitamin D3, 1,000 unit Oral Tablet Take 2 Tablets (2,000 Units total) by mouth Twice daily (Patient taking differently: Take 2 Tablets (2,000 Units total) by mouth Daily)    cloNIDine  HCL (CATAPRES ) 0.1 mg Oral Tablet Take 1 Tablet (0.1 mg total) by mouth Twice daily    cyanocobalamin (VITAMIN B 12) 1,000 mcg Oral Tablet Take 1 Tablet (1,000 mcg total) by mouth Once a day    ELIQUIS  2.5 mg Oral Tablet Take 1 tablet by mouth twice daily (Patient taking differently: Take 2 Tablets (5 mg total) by mouth Twice daily)    levothyroxine  (SYNTHROID ) 75 mcg Oral Tablet Take 1 Tab (75 mcg total) by mouth Every morning (Patient taking differently: Take 88 mcg by mouth Every morning)    lisinopriL  (PRINIVIL ) 40 mg Oral Tablet Take 1 Tablet (40 mg total) by mouth Once a day    polysaccharide iron  complex (FERREX 150) 150 mg iron  Oral Capsule Take 1 capsule by mouth once daily     Family Medical History:       Problem Relation (Age of Onset)    Breast Cancer Daughter (29)    Cancer Other    Coronary Artery Disease Other    Diabetes Sister, Brother, Daughter    Hypertension (High Blood Pressure) Mother, Sister, Brother    Ovarian Cancer Maternal cousin (72)    Stroke Maternal Grandmother    Thyroid  Disease Sister, Brother           Social History     Socioeconomic History    Marital status: Married   Tobacco Use    Smoking status: Never    Smokeless tobacco: Never   Vaping Use    Vaping status: Never Used   Substance and Sexual Activity    Alcohol use: No     Alcohol/week: 0.0 standard drinks of alcohol    Drug use: Never    Sexual activity: Not Currently     Birth control/protection: Post-menopausal, Surgical       Physical Exam:  Vital Signs:  BP (!) 200/95 Comment: Provider  Notified; this is 2nd check with manual cuff  Pulse 77   Ht 1.651 m (5' 5)   Wt 75.8 kg (167 lb)   LMP 12/01/1980   BMI 27.79 kg/m         Physical Exam  Vitals and nursing note reviewed.   Musculoskeletal:      Right shoulder: Decreased range of motion.      Left shoulder: Decreased range of motion.      Right hand: Deformity (significant arthritis) present.      Left hand: Deformity (significant arthritis) present.      Cervical back: Spasms, tenderness and bony tenderness present. Decreased range of motion.      Thoracic back: Spasms and tenderness (Kyphosis) present.      Comments: ar   Neurological:      Mental Status: She is alert and oriented to person, place, and time.  Sensory: No sensory deficit.      Motor: Weakness (BUE) present.         Assessment:    ICD-10-CM    1. Chronic pain syndrome  G89.4 URINE DRUG SCREEN-PAIN CLINIC (NO THC)     Referral to PHYSICAL/OCCUPATIONAL THERAPY - External     XR CERVICAL SPINE SERIES W FLEX EXT     XR THORACIC SPINE 3 VIEWS      2. Myalgia  M79.10 Referral to PHYSICAL/OCCUPATIONAL THERAPY - External     XR THORACIC SPINE 3 VIEWS      3. Neck pain  M54.2 XR CERVICAL SPINE SERIES W FLEX EXT      4. 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            Plan:  Orders Placed This Encounter    XR CERVICAL SPINE SERIES W FLEX EXT    XR THORACIC SPINE 3 VIEWS    URINE DRUG SCREEN-PAIN CLINIC (NO THC)    Referral to PHYSICAL/OCCUPATIONAL THERAPY - External     Order new X-rays for completion   Order MRI of cervical spine to evaluate persistent neck pain with weakness of arms  Order given for physical therapy to improve arm strength and teach home exercise program to prevent her from becoming weaker  Follow up in 4 weeks to review imaging and discuss treatment options   Consider diagnostic cervical medial branch block based on imaging and symptoms   Discussed the need to follow up with PCP for elevated blood pressure  I am scribing for, and in the presence of,  Dr. Lahoma for services provided on 09/14/2024.  Harlene Law, MA     Portions of this note may be dictated using voice recognition software or a dictation service. Variances in spelling and vocabulary are possible and unintentional. Not all errors are caught/corrected. Please notify the dino if any discrepancies are noted or if the meaning of any statement is not clear.          [1]   Allergies  Allergen Reactions    Adhesive Rash     Needs tape for SENSITIVE SKIN    Bactrim [Sulfamethoxazole-Trimethoprim]     Ciprofloxacin     Digoxin     Plaquenil [Hydroxychloroquine]     Simvastatin     Sulfa (Sulfonamides)

## 2024-09-15 ENCOUNTER — Other Ambulatory Visit (HOSPITAL_BASED_OUTPATIENT_CLINIC_OR_DEPARTMENT_OTHER): Payer: Self-pay | Admitting: PAIN MANAGEMENT

## 2024-09-15 DIAGNOSIS — M791 Myalgia, unspecified site: Secondary | ICD-10-CM

## 2024-09-15 DIAGNOSIS — G894 Chronic pain syndrome: Secondary | ICD-10-CM

## 2024-09-19 ENCOUNTER — Ambulatory Visit
Admission: RE | Admit: 2024-09-19 | Discharge: 2024-09-19 | Disposition: A | Discharge status: Still In Hospital | Payer: Self-pay | Source: Ambulatory Visit | Attending: PAIN MANAGEMENT | Admitting: PAIN MANAGEMENT

## 2024-09-19 ENCOUNTER — Encounter (HOSPITAL_BASED_OUTPATIENT_CLINIC_OR_DEPARTMENT_OTHER): Payer: Self-pay

## 2024-09-19 ENCOUNTER — Other Ambulatory Visit: Payer: Self-pay

## 2024-09-19 DIAGNOSIS — G894 Chronic pain syndrome: Secondary | ICD-10-CM | POA: Insufficient documentation

## 2024-09-19 DIAGNOSIS — M542 Cervicalgia: Secondary | ICD-10-CM | POA: Insufficient documentation

## 2024-09-19 DIAGNOSIS — M791 Myalgia, unspecified site: Secondary | ICD-10-CM | POA: Insufficient documentation

## 2024-09-19 NOTE — PT Evaluation (Signed)
 General Evaluation         Name: Cynthia Barrera, 83 y.o., female  MRN: [id number]  DOB: June 30, 1941  Estimated body mass index is 27.79 kg/m as calculated from the following:    Height as of 09/14/24: 1.651 m (5' 5).    Weight as of 09/14/24: 75.8 kg (167 lb).  Date of Service: 09/19/2024     Physical Therapy Evaluation    Diagnosis:  G89.4 (ICD-10-CM) - Chronic pain syndrome  M79.10 (ICD-10-CM) - Myalgia        Occupation: Data Unavailable   Off Work: N/A If off, since:   Primary Care Physician: Delon Silversmith, DO     Referring Provider: Evalene Roche, MD      History of Injury:  Patient referred to PT for chronic pain syndrome has had neck and upper back pain for years at least 8, in the last couple months worsening to where her activities are really short lived.  Patient's husband present and notes patient is legally blind.  Patient feels her neck and upper back are the worst.  Husband notes if the patient is using her arms a little such as to cook a meal she is having issues.    Diagnostic Tests:   Functional Limitations: sweeping, mopping, making bed, cooking a meal - takes her an hour and a half bc she has to rest.  Current Symptoms: pain in her upper back and neck  Description of Pain: sharp  Level of Pain: (0 - 10)     Currently: 0    Best: 0    Worst: 10   What helps: sitting on a heating pad  on the back of her chair  What makes it worse:  standing and doing   Prior injuries to the area: No but she has fallen a lot (she uses a walker if she is going a good distance)  Prior treatment to the area: No  Chiropractic Visits:  June-August (eased pain but never took it away) every week for 2 months   Past Medical History:   Diagnosis Date    Anemia     Arthritis     Atrial fibrillation     CKD (chronic kidney disease)     Degenerative joint disease     Diverticulosis     Essential hypertension     H/O urinary frequency     History of fibrocystic disease of breast     History of nephrolithiasis     History of  osteoporosis     History of retinopathy     Hypertension     Kidney stones     Lichen sclerosus et atrophicus 2016    left labial/periclitoral hood punch bx-tx with clobetasol    Mixed hyperlipidemia     Osteoporosis     -2.7 left hip, improved 2021, -1.8 spine    Paroxysmal atrial fibrillation (CMS HCC)     Rheumatoid arthritis     Sleep apnea     Thyroid  disease     Ventricular ectopy           Past Surgical History:   Procedure Laterality Date    HX BREAST BIOPSY  2002    FNA x 3 bilaterally    HX CYST INCISION AND DRAINAGE      about 2000    HX HYSTERECTOMY  1980    TAH; Partial    HX PARATHYROIDECTOMY      HX PARTIAL THYROIDECTOMY      parathyroid  right  lube    HX TONSIL AND ADENOIDECTOMY      HX TONSIL AND ADENOIDECTOMY      HX TUBAL LIGATION      HX WRIST FRACTURE TX Left     about 2004     LITHOTRIPSY       Pacemaker? No  Blood thinners? Yes, eloquis, 2x/day for A-fib has not had any episodes in a couple years, does have some BP issues where it could either be too high and too low  Visual changes? Yes, legally blind can see shadows and can see to get around and has some peripheral issues  Chest pain? No  Difficulty breathing? No  Difficulty swallowing? No  Dizziness?No  Lightheadedness? No  Bowel and bladder changes? No  Headaches? No  Allergies? Yes, plaquing, cepastat, Cipro, bactrim, adhesive tape  History of fx? Wrist L   History of cancer? No   Surgical history?see above         Other pertinent medical history: can get white coat syndrome easily with BP, is recently off lisinopril -goes to her endocrinologist next week  Previous functional level: progressive worsening off activity.       Objective:  Posture: dowagers hump, kyphosis, forward head and shoulder, anterior pectoral tightness  Palpation: tender to upper traps which were very tonic, levator and cervical paraspinals   Cervical AROM  Flexion chin to chest  Extension 25 deg  R side bending 10 deg   L side bending 15 deg   Rotation R 45 deg L 40  deg     Manual Muscle Testing (shoulders and upper arms)  Flexion 4-/5 bilaterally pain in cervical spine   Abduction 4-/5 bilaterally pain in cervical spine   ER 4/5 arms at side   IR 5/5 arms at side   Mid trap 4-/5 bilaterally  Rhomboids 3+/5 bilaterally  Lower trap 3+/5 bilaterally    Assessment:  Pt is a 83 year old  female that presents to the clinic with chronic pain syndrome mainly to the upper back and her neck area. Null for medical red flag findings at this time based on subjective reports and lack of red flag findings. Yellow flags for high BP and low BP at times, is legally blind. Pt is a candidate for physical therapy based on movement deficits identified during the objective exam like decreased AROM of the cervical spine and decreased MMT's of the upper extremities and scapular muscles  that could be contributing to symptoms reported and difficulty with functional and recreational activities reported like ADL's, and other IADL's such as cleaning, cooking -her personal goal is to get to 20 minutes of activity vs the current 5 minutes she is able to do.  Pt could benefit from skilled physical therapy services like guided and supervised movement interventions like neuromuscular re-education activities to improve motor control and stability and therapeutic exercises to improve functional mobility, strength and endurance, and manual therapy interventions to address joint mobility and soft tissue extensiblity deficits to address functional limitations, participation restrictions and to address goals that are set to ultimately allow pt to return to prior level of function safely and with less difficulty.        Short Term Goals:  In 1-4  weeks:  1)  Patient will report benefit to 4 neubie protocols (cervical and shoulder).  2)  Patient will report benefit from establish HEP.      Long Term Goal:  In 8 weeks  1) Increase MMT's to 4/5> in scapular muscles  and shoulder musculature (long lever flexion and  abduction 4-/5 at eval and rhomboid 3+/5, lower trap 3+/5) to assist with cleaning and cooking up to 20 minutes before having a rest break.  2)  Patient will participate in muscular endurance activities to assist goal 1.  3)  Patient will improve AROM of the cervical spine to 50 deg rotation> to be able to turn head and talk with more ease (at eval 40 and 45 deg).  4) Patient will be able to carry out HEP as established to continue to build muscular endurance once formal PT is completed.      Plan:  Therapeutic exercise, Therapeutic Activity, Neuro Re-Ed, Modalities, Manual, Gait, HEP.       Frequency: 2 days a week for Duration 8 weeks.    Clotilda LITTIE Cooler, [place] #[id number] 09/19/2024 09:37      POC Dates:  09/19/2024-11/14/2024  Physician Name:  Evalene JONELLE Roche, MD      Physicians Signature: ___________________________________Date: _________Time:________

## 2024-09-19 NOTE — PT Treatment (Addendum)
 [facility]  PT Neck Treatment    Patient Name: Cynthia Barrera  Patient DOB: 02/24/1941    Referring physician: Evalene JONELLE Roche, MD    Return to Doctor: 11/7 MRI of spine, 11/14 Return to Dr. Roche    Diagnosis: G89.4 Chronic Pain Syndrome, Myalgia M79.10    Physical Therapy Visit    Time in: N/A   Time out: N/A   Visit #: 1 per dar 1/?  Chiropractic Visits:  several earlier this year  POC Due: 11/14/2024    Did PT communicate with PTA:  Yes  Precautions/Restrictions: ADHESIVE TAPE ALLERGY has some BP issues so only monitor if symptomatic, needs cervical and shoulder loosening x 4 visits then progress into strengthening and endurance, can also do soft tissue.  Her goal is to be able to do activities with her arms up to 20 minutes before needing to rest, I did her HEP on medbridge she does not have a print out yet, please review and give    Treatment Note      Subjective: see eval     Objective: All treatment performed one-on-one except as noted with unattended description.  Patient returns to PT for chronic pain syndrome and myalgia.          Total tx time N/A minutes          Assessment: see eval    Short Term Goals:  In 1-4  weeks:  1)  Patient will report benefit to 4 neubie protocols (cervical and shoulder).  2)  Patient will report benefit from establish HEP.       Long Term Goal:  In 8 weeks  1) Increase MMT's to 4/5> in scapular muscles and shoulder musculature (long lever flexion and abduction 4-/5 at eval and rhomboid 3+/5, lower trap 3+/5) to assist with cleaning and cooking up to 20 minutes before having a rest break.  2)  Patient will participate in muscular endurance activities to assist goal 1.  3)  Patient will improve AROM of the cervical spine to 50 deg rotation> to be able to turn head and talk with more ease (at eval 40 and 45 deg).  4) Patient will be able to carry out HEP as established to continue to build muscular endurance once formal PT is  completed.         Plan: see eval           Home Exercise Program History (HEP)     Access Code: [email or web address]  URL: [email or web address]  Date: 09/19/2024  Prepared by: Clotilda Cooler    Exercises  - Supine Cervical Rotation AROM on Pillow  - 2-3 x daily - 2 sets - 10 reps - 2-3 seconds hold  - Supine Cervical Retraction with Towel  - 2-3 x daily - 2 sets - 10 reps - 2-3 seconds hold  - Seated Scapular Retraction  - 2-3 x daily - 2 sets - 10 reps - 2-3 seconds hold  - Seated Thoracic Lumbar Extension  - 2-3 x daily - 2 sets - 10 reps - 2-3 seconds hold    Exercises/Modalities Not Performed This Treatment:          Clotilda LITTIE Cooler, PT [phone]

## 2024-09-26 ENCOUNTER — Other Ambulatory Visit

## 2024-09-26 ENCOUNTER — Encounter: Payer: Self-pay | Admitting: Internal Medicine

## 2024-09-26 ENCOUNTER — Ambulatory Visit (INDEPENDENT_AMBULATORY_CARE_PROVIDER_SITE_OTHER): Payer: Medicare Other | Admitting: Internal Medicine

## 2024-09-26 VITALS — BP 144/88 | HR 63 | Ht 63.0 in | Wt 168.6 lb

## 2024-09-26 DIAGNOSIS — E039 Hypothyroidism, unspecified: Secondary | ICD-10-CM | POA: Diagnosis not present

## 2024-09-26 DIAGNOSIS — Z23 Encounter for immunization: Secondary | ICD-10-CM

## 2024-09-26 DIAGNOSIS — E559 Vitamin D deficiency, unspecified: Secondary | ICD-10-CM

## 2024-09-26 DIAGNOSIS — M810 Age-related osteoporosis without current pathological fracture: Secondary | ICD-10-CM

## 2024-09-26 DIAGNOSIS — E210 Primary hyperparathyroidism: Secondary | ICD-10-CM

## 2024-09-26 LAB — VITAMIN D 25 HYDROXY (VIT D DEFICIENCY, FRACTURES): Vit D, 25-Hydroxy: 76 ng/mL (ref 30–100)

## 2024-09-26 LAB — T4, FREE: Free T4: 1.7 ng/dL (ref 0.8–1.8)

## 2024-09-26 LAB — TSH: TSH: 5.16 m[IU]/L — ABNORMAL HIGH (ref 0.40–4.50)

## 2024-09-26 MED ORDER — ALENDRONATE SODIUM 35 MG PO TABS: 35.0000 mg | ORAL_TABLET | ORAL | 3 refills | Status: AC

## 2024-09-26 NOTE — Progress Notes (Signed)
 Patient ID: Teresa Caldwell, female   DOB: 1941-11-11, 83 y.o.   MRN: [id number]   HPI  Teresa Caldwell is a 83 y.o.-year-old female, initially referred by her cardiologist, Dr. Cherrie (previously saw Dr. Maurie in [facility] returning for follow-up for hypercalcemia/hyperparathyroidism. She was seeing endocrinology in [place], [place], Dr. Garnette Bolster, now retired.  Last visit with me 1 year ago.  She is here with her husband who offers part of the history especially related to her past medical history, symptoms, medications.  Interim history: No fractures since last visit.  She fell in her kitchen 2 mo ago - only scraped her leg. She has fluctuating BP - up to 202/102 at home.  She was off and on her blood pressure medications due to previously low BP. She had constipation with the regular iron (Niferex) - now on Accrufer. No constipation so far on this.   Hypercalcemia  -Diagnosed in 2004 - was followed by her endocrinologist in [place] . They were following the levels of the calcium and PTH.  Her calcium started to increase in 10/2016.  She had right inferior parathyroidectomy on 07/18/2019 with Dr. Eletha.  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  Calcium level postop was 8.9, PTH slightly high at 74.  She felt better after surgery, with less fatigue.  Reviewed pertinent labs: 07/22/2024: Calcium 9.7 (8.7-10.3), PTH 38 07/07/2023: Calcium 9.2, PTH 33 07/23/2022 calcium 9.1 (8.7-10.3), albumin 4.2 09/20/2021: Calcium 9.2 05/01/2021: Calcium 9.5, PTH 50 Lab Results  Component Value Date   PTH 36 09/17/2020   PTH 71 (H) 09/12/2019   PTH Comment 09/12/2019   PTH 118 (H) 08/27/2018   PTH 45 02/25/2018   PTH 76 (H) 04/13/2017   CALCIUM 9.9 09/17/2020   CALCIUM 9.3 09/12/2019   CALCIUM 8.4 (L) 07/19/2019   CALCIUM 9.9 07/14/2019   CALCIUM 10.4 08/27/2018   CALCIUM 9.1 02/25/2018   CALCIUM 10.4 04/13/2017   CALCIUM 11.1 (H) 01/02/2017  07/08/2018 records from PCP: Ca 10.9 (8.5-10.1),  PTH 157 (18-80) 07/13/2017: PTH 210, calcium: 10.4 (8.5-10.1) 12/22/2016: Calcium 10.8 (8.5-10.1), iCa 5.91 (4.57-5.43) PTH 256 12/17/2016: Calcium 10.4 (8.5-10.1), Mg  06/25/2015: iCa 1.43 (1.12-1.32), PTH 159  24-hour urine calcium was normal (but slightly low creatinine): Component     Latest Ref Rng & Units 05/14/2017  Calcium, Ur     Not estab mg/dL 8  Calcium, 24 hour urine     35 - 250 mg/24 h 192  Creatinine, Urine     20 - 320 mg/dL 24  Creatinine, 75Y Ur     0.63 - 2.50 g/24 h 0.58 (L)   Osteoporosis:  Reviewed most recent T-scores: 10/31/2022 [facility]): L1-L4: -1.5 (+3%) Left total hip: -2.8 (-2.8%)  10/29/2020: L1-L4: -1.6 (+6.1%*) Left total hip: -2.7 (+5.7%) Femoral neck: -2.7  07/13/2018:  L spine -2.2 (-5.7%*) L hip: -2.9 (no change)  She started Fosamax  in 07/2018- on 35 mg weekly.  No fractures.  She has a history of 3 kidney stones, last in 2015, for which she had lithotripsy.  + CKD she sees nephrology; last eval: 07/22/2024: 33/2.01, GFR 24 Lab Results  Component Value Date   BUN 30 (H) 09/17/2020   BUN 31 (H) 07/19/2019   CREATININE 2.16 (H) 09/17/2020   CREATININE 1.88 (H) 07/19/2019   She stopped HCTZ in 10/2016.  Vitamin D  deficiency:  Most recent vitamin D  level was normal: Lab Results  Component Value Date   VD25OH 65.8 09/15/2023   VD25OH 62.7 09/15/2022   VD25OH  98.42 09/23/2021   VD25OH 57.1 09/17/2020   VD25OH 76.82 09/12/2019   VD25OH 69.16 08/27/2018   VD25OH 59.73 02/25/2018   VD25OH 46.04 04/13/2017   VD25OH 25.93 (L) 02/06/2017  07/13/2017: 43.3 12/17/2016: Vitamin D  22.8 06/25/2015: Vitamin D  19.2 Her cardiologist added a vitamin D  2000 IU/day >> we increased the dose to 4000 units in 01/2017. In 08/2021, I advised her to decrease the dose back to 2000 units daily.  She takes this dose now.  Pt does not have a FH of hypercalcemia, pituitary tumors. Mother and sister had osteoporosis.  Sister and daughter had  thyroid  cancer.  Hypothyroidism  -Apparently diagnosed before starting amiodarone .  She was able to reduce the dose of levothyroxine  when her amiodarone  dose was reduced in 2018  She is now taking levothyroxine  88 mcg daily (increased 08/2021): - in am - fasting - at least 30 min from b'fast - no Ca, MVI, PPIs, + Fe at night - not on Biotin  Reviewed her TFTs: Lab Results  Component Value Date   TSH 3.530 09/15/2023   TSH 5.140 (H) 09/15/2022   TSH 5.74 (H) 09/23/2021   TSH 6.24 (H) 09/17/2020   TSH 3.47 09/12/2019   TSH 2.05 02/25/2018   TSH 1.35 04/13/2017   TSH 3.59 02/06/2017   FREET4 1.47 09/12/2024   FREET4 2.02 (H) 09/15/2023   FREET4 1.96 (H) 09/15/2022   FREET4 1.30 09/23/2021   FREET4 1.34 09/17/2020   FREET4 1.31 09/12/2019   FREET4 1.58 02/25/2018   FREET4 1.45 04/13/2017   FREET4 1.40 02/06/2017   07/13/2017: TPO Abs <28  She was on Plaquenil for RA. She sees Ophthalmology at [facility]. 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   She and her husband are driving from 5 hours away to come see their daughter.  ROS: + See HPI  I reviewed pt's medications, allergies, PMH, social hx, family hx, and changes were documented in the history of present illness. Otherwise, unchanged from my initial visit note.  Patient Active Problem List   Diagnosis Date Noted   Vitamin D  insufficiency 02/06/2017    Priority: High   Hypothyroidism 02/06/2017    Priority: High   Hyperparathyroidism, primary 02/06/2017    Priority: High   Osteoporosis 02/06/2017    Priority: High   Primary hyperparathyroidism 07/18/2019   Glucose intolerance (impaired glucose tolerance) 02/06/2017   Essential hypertension 02/06/2017   Atrial fibrillation (HCC) 02/06/2017   OSA on CPAP 02/06/2017   B12 deficiency 02/06/2017   Hyperlipidemia 02/06/2017   IDA (iron deficiency anemia) 02/06/2017   Past Surgical History:  Procedure Laterality Date   TONSILLECTOMY  1956     WRIST ARTHROSCOPY Left 1994   Also: - Hysterectomy 1989 - Lithotripsy x3 - Cataract L and R 2018   Social History   Social History   Marital status: Married    Spouse name: N/A   Number of children: 5 (1 daughter lives in [place])   Occupational History   n/a   Social History Main Topics   Smoking status: Never Smoker   Smokeless tobacco: Never Used   Alcohol use No   Drug use: No   Current Outpatient Medications on File Prior to Visit  Medication Sig Dispense Refill   alendronate  (FOSAMAX ) 35 MG tablet Take 1 tablet (35 mg total) by mouth every 7 (seven) days. 15 tablet 3   amiodarone  (PACERONE ) 100 MG tablet Take 100 mg by mouth every morning.     amLODipine  (NORVASC ) 5  MG tablet Take 5 mg by mouth daily.     Cholecalciferol (VITAMIN D -3 PO) Take 2,000 Units by mouth 2 (two) times daily.      cloNIDine  (CATAPRES ) 0.1 MG tablet Take by mouth.     ELIQUIS 2.5 MG TABS tablet Take 2.5 mg by mouth 2 (two) times daily.     iron polysaccharides (NIFEREX) 150 MG capsule Take 150 mg by mouth every evening.     levothyroxine  (SYNTHROID ) 88 MCG tablet Take 1 tablet (88 mcg total) by mouth daily before breakfast. 90 tablet 3   lisinopril  (PRINIVIL ,ZESTRIL ) 40 MG tablet Take 20 mg by mouth every morning.      metoCLOPramide  (REGLAN ) 10 MG tablet Take 0.5 tablets (5 mg total) by mouth every 6 (six) hours as needed for nausea. (Patient not taking: Reported on 09/25/2023) 4 tablet 0   traZODone  (DESYREL ) 50 MG tablet Take 25-50 mg by mouth at bedtime as needed for sleep. (Patient not taking: Reported on 09/25/2023)     vitamin B-12 (CYANOCOBALAMIN) 1000 MCG tablet Take 1,000 mcg by mouth daily.     No current facility-administered medications on file prior to visit.   Allergies  Allergen Reactions   Bactrim [Sulfamethoxazole-Trimethoprim] Dermatitis    blisters   Ciprofloxacin Hives   Digoxin And Related Hives    And blisters (eyes & lips)   Sulfa Antibiotics Hives   Tape Other  (See Comments)    Needs tape for SENSITIVE SKIN   Clindamycin/Lincomycin Rash   Family History  Problem Relation Age of Onset   Hypertension Mother    Heart disease Mother    Diabetes Child    Hypertension Child    Thyroid  disease Child    Cancer Child    PE: BP (!) 144/88   Pulse 63   Ht 5' 3 (1.6 m)   Wt 168 lb 9.6 oz (76.5 kg)   LMP  (LMP Unknown)   SpO2 98%   BMI 29.87 kg/m  Wt Readings from Last 3 Encounters:  09/26/24 168 lb 9.6 oz (76.5 kg)  09/25/23 169 lb (76.7 kg)  09/24/22 168 lb 6.4 oz (76.4 kg)   Constitutional: overweight, in NAD Eyes:  EOMI, no exophthalmos ENT: no neck masses, no cervical lymphadenopathy Cardiovascular: RRR, No MRG Respiratory: CTA B Musculoskeletal: no deformities Skin:no rashes Neurological: no tremor with outstretched hands  Assessment: 1. Hypercalcemia/hyperparathyroidism -+ History of complications from hypercalcemia: Nephrolithiasis, osteoporosis, no fractures.   2. Vit D insufficiency  3. Hypothyroidism  4. Osteoporosis  Fax results to Dr. Zanabli (fax [phone]) and Dr. Dennise.   Plan: 1. And 2.   Patient with a history of elevated calcium (highest at 11.1) also very high intact PTH (256 for a calcium of 10.8), with a normal 24-hour urine calcium and vitamin D .  Biochemical investigation therefore pointing towards primary hyperparathyroidism.  She had a history of nephrolithiasis and osteoporosis, both possible complications of hyperparathyroidism.  I referred her to surgery and she had right inferior parathyroidectomy by Dr. Eletha in 07/2019.  After surgery, she felt well, without complaints. 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  Latest levels were reviewed: 03/23/2024: PTH 38, 07/22/2024: Calcium 9.7, both normal - She continues on 2000 units vitamin D  daily - Latest vitamin D  level was normal at last visit and we will recheck this today - I will see the patient back in 1  year  3. Hypothyroidism - latest thyroid  labs reviewed with pt. >>  normal: Lab Results  Component Value Date   TSH 3.530 09/15/2023  - she continues on LT4 88 mcg daily - pt feels good on this dose. - we discussed about taking the thyroid  hormone every day, with water, >30 minutes before breakfast, separated by >4 hours from acid reflux medications, calcium, iron, multivitamins. Pt. is taking it correctly. - will check thyroid  tests today: TSH and fT4 - If labs are abnormal, she will need to return for repeat TFTs in 1.5 months  4.  Osteoporosis - no fractures and falls since last OV - Previously on low-dose Fosamax  35 mg weekly (lower dose due to renal disease).  She has been on this for 4 years when I saw her in 2023.  At that time, she was advised by PCP to stop it.  Since her bone density was still in the osteoporotic range, I suggested to restart this and continue at least another 1 to 2 years, but ideally until the bone density scan improved.  Fosamax  is approved for 5 to 10 years, depending on the fracture risk and bone density. -Reviewed her bone density scans from 2021 and 2023, the bone density at the spine appears to have improved, possibly due to parathyroid  surgery, but the bone density at the level of the hip remains approximately stable. - As of now, she continues, without side effects -no jaw/thigh/hip pain - she is due for another DXA scan -I advised them to get this on the same machine in [place] and have it sent to be  Needs refills LT4.  For results, call daughter GLENWOOD Mar: [phone]  + Flu shot today  Orders Placed This Encounter  Procedures   TSH   T4, free   VITAMIN D  25 Hydroxy (Vit-D Deficiency, Fractures)   Lela Fendt, MD PhD [facility]

## 2024-09-26 NOTE — Patient Instructions (Addendum)
 Please continue: - Fosamax  35 mg weekly  Please continue vitamin D  2000 units daily.  Continue Levothyroxine  88 mcg daily.  Take the thyroid  hormone every day, with water, at least 30 minutes before breakfast, separated by at least 4 hours from: - acid reflux medications - calcium - iron - multivitamins  Please stop at the lab.  Please have another bone density on the same machine in [place].  Please come back for a follow-up appointment in 1 year.

## 2024-09-27 ENCOUNTER — Ambulatory Visit: Payer: Self-pay | Admitting: Internal Medicine

## 2024-09-27 MED ORDER — LEVOTHYROXINE SODIUM 88 MCG PO TABS: 88.0000 ug | ORAL_TABLET | Freq: Every day | ORAL | 3 refills | Status: AC

## 2024-09-27 NOTE — Addendum Note (Signed)
 Addended by: TRIXIE FILE on: 09/27/2024 01:38 PM   Modules accepted: Orders

## 2024-09-28 ENCOUNTER — Telehealth: Payer: Self-pay | Admitting: Dietician

## 2024-09-28 NOTE — Telephone Encounter (Signed)
 Patient's husband called. Lab results from Dr. Trixie reviewed with him. They would like for her labs in 2-3 months be done locally.  Leita Constable, RD, LDN, CDCES, DipACLM

## 2024-10-03 NOTE — Telephone Encounter (Signed)
 LMTRC, I need to know what location they are wanting.   J.Jackob Crookston,RMA

## 2024-10-04 ENCOUNTER — Other Ambulatory Visit (INDEPENDENT_AMBULATORY_CARE_PROVIDER_SITE_OTHER): Payer: Self-pay | Admitting: Physician Assistant

## 2024-10-04 ENCOUNTER — Ambulatory Visit (HOSPITAL_BASED_OUTPATIENT_CLINIC_OR_DEPARTMENT_OTHER): Payer: Self-pay

## 2024-10-04 DIAGNOSIS — I10 Essential (primary) hypertension: Secondary | ICD-10-CM

## 2024-10-06 ENCOUNTER — Ambulatory Visit (HOSPITAL_BASED_OUTPATIENT_CLINIC_OR_DEPARTMENT_OTHER): Payer: Self-pay

## 2024-10-06 MED ORDER — AMIODARONE 100 MG TABLET
100.0000 mg | ORAL_TABLET | Freq: Every day | ORAL | 1 refills | Status: AC
Start: 2024-10-06 — End: ?

## 2024-10-06 MED ORDER — CLONIDINE HCL 0.1 MG TABLET
0.1000 mg | ORAL_TABLET | Freq: Two times a day (BID) | ORAL | 2 refills | Status: AC
Start: 2024-10-06 — End: ?

## 2024-10-06 NOTE — Telephone Encounter (Signed)
 Call Attempted to patient.

## 2024-10-07 ENCOUNTER — Other Ambulatory Visit: Payer: Self-pay

## 2024-10-07 ENCOUNTER — Ambulatory Visit
Admission: RE | Admit: 2024-10-07 | Discharge: 2024-10-07 | Disposition: A | Discharge status: Home / Self Care | Payer: Self-pay | Source: Ambulatory Visit | Attending: Family Medicine | Admitting: Family Medicine

## 2024-10-07 DIAGNOSIS — M5412 Radiculopathy, cervical region: Secondary | ICD-10-CM | POA: Insufficient documentation

## 2024-10-07 DIAGNOSIS — G894 Chronic pain syndrome: Secondary | ICD-10-CM | POA: Insufficient documentation

## 2024-10-10 ENCOUNTER — Encounter (INDEPENDENT_AMBULATORY_CARE_PROVIDER_SITE_OTHER)

## 2024-10-10 ENCOUNTER — Encounter (INDEPENDENT_AMBULATORY_CARE_PROVIDER_SITE_OTHER): Payer: Self-pay

## 2024-10-10 ENCOUNTER — Ambulatory Visit (HOSPITAL_BASED_OUTPATIENT_CLINIC_OR_DEPARTMENT_OTHER): Payer: Self-pay

## 2024-10-10 VITALS — BP 170/90 | Ht 65.0 in | Wt 168.0 lb

## 2024-10-10 DIAGNOSIS — E782 Mixed hyperlipidemia: Secondary | ICD-10-CM

## 2024-10-10 DIAGNOSIS — I5030 Unspecified diastolic (congestive) heart failure: Secondary | ICD-10-CM | POA: Insufficient documentation

## 2024-10-10 DIAGNOSIS — I480 Paroxysmal atrial fibrillation: Secondary | ICD-10-CM

## 2024-10-10 DIAGNOSIS — N189 Chronic kidney disease, unspecified: Secondary | ICD-10-CM

## 2024-10-10 DIAGNOSIS — Z8249 Family history of ischemic heart disease and other diseases of the circulatory system: Secondary | ICD-10-CM

## 2024-10-10 DIAGNOSIS — I10 Essential (primary) hypertension: Secondary | ICD-10-CM | POA: Insufficient documentation

## 2024-10-10 DIAGNOSIS — Z823 Family history of stroke: Secondary | ICD-10-CM

## 2024-10-10 DIAGNOSIS — E079 Disorder of thyroid, unspecified: Secondary | ICD-10-CM

## 2024-10-10 DIAGNOSIS — Z789 Other specified health status: Secondary | ICD-10-CM

## 2024-10-10 DIAGNOSIS — G4733 Obstructive sleep apnea (adult) (pediatric): Secondary | ICD-10-CM

## 2024-10-10 MED ORDER — POTASSIUM CHLORIDE ER 10 MEQ TABLET,EXTENDED RELEASE
10.0000 meq | ORAL_TABLET | Freq: Every day | ORAL | 3 refills | Status: AC
Start: 2024-10-10 — End: ?

## 2024-10-10 MED ORDER — FUROSEMIDE 20 MG TABLET
20.0000 mg | ORAL_TABLET | Freq: Every day | ORAL | 3 refills | Status: AC
Start: 2024-10-10 — End: ?

## 2024-10-10 NOTE — Progress Notes (Signed)
 Cardiology [facility]  [address] [place] [address]  [phone]    Cardiology  Clinic Note    Name: Cynthia Barrera   DOB: 12-27-1940  [83 y.o. female]   MRN: [id number]       Visit Date: 10/10/2024   Referring: No referring provider defined for this encounter.   PCP: Delon Silversmith, DO         Chief Complaint: Follow Up (F/U TO ER [facility] 10/04/24) and Atrial Fibrillation      History of Present Illness   Cynthia Barrera is a 83 y.o. White female who presents for a follow-up visit after recent hospital admission at [facility].      She has no history of coronary artery disease.  She has paroxysmal atrial fibrillation in his maintaining sinus rhythm with amiodarone  and is anticoagulated with Eliquis .  Nuclear stress test February 2020 was negative for ischemia or infarction with 74% ejection fraction.  She has a history of parathyroid  disease and previous parathyroidectomy and follows with endocrinology.  She also has chronic kidney disease and follows with Nephrology.      She presented to [facility] emergency room 10/05/2024 with complaints of severe hypertension and lower extremity edema.  She was diuresed and medications adjusted.  She had an echocardiogram that revealed ejection fraction of 70-75% with mild MR and TR.  Further ischemic evaluation was not recommended.  She was instructed to follow up here.      She is feeling better but reports fatigue which he attributes to inadequate sleep due to frequent urination at night.  He has continued dependent lower extremity edema.  She denies chest pain, shortness of breath, orthopnea, PND, palpitations, presyncope, syncope.  She has hypertensive today but states that her blood pressure has been reasonably well-controlled at home.      EKG today:  Sinus rhythm, left ventricular hypertrophy, ST-T abnormalities.  No acute change.      Labs 10/05/2024:  Hemoglobin A1c 5.4, free T4 1.5, TSH 7.09, GFR 24, vitamin B12 1471, WBC 5.1, hemoglobin 11.1, platelets 206, magnesium 2.1, total cholesterol 167, HDL 59, triglycerides 110, LDL 86.    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    Insomnia 12/06/2019    CKD (chronic kidney disease), stage IV (CMS HCC) 12/06/2019    Chronic bilateral thoracic back pain 07/08/2018    Skin lesion 07/08/2018    OSA on CPAP 10/04/2015    B12 deficiency 06/22/2015    IDA (iron  deficiency anemia) 03/15/2015    Bilateral edema of lower extremity 10/10/2014    Fatigue 09/15/2013    Atrial fibrillation 04/08/2013    Hyperparathyroidism (CMS HCC) 03/18/2013    Vitamin D  deficiency 09/17/2012    Glucose intolerance (impaired glucose tolerance) 09/17/2012    Essential hypertension 04/10/2000    Hypothyroidism 04/10/2000    Osteoporosis 04/10/2000  Left hip -2.7 2021, spine -1.8-  PCP started Fosomax 2019      HLD (hyperlipidemia) 09/19/1998       Allergies  Allergies[1]    Medications  Current Medications[2]    History  Past Medical History:   Diagnosis Date    Anemia     Arthritis     Atrial fibrillation     CKD (chronic kidney disease)     Degenerative joint disease     Diverticulosis     Essential hypertension     H/O urinary frequency     History of fibrocystic disease of breast     History of nephrolithiasis     History of osteoporosis     History of retinopathy     Hypertension     Kidney stones     Lichen sclerosus et atrophicus 2016    left  labial/periclitoral hood punch bx-tx with clobetasol    Mixed hyperlipidemia     Osteoporosis     -2.7 left hip, improved 2021, -1.8 spine    Paroxysmal atrial fibrillation (CMS HCC)     Rheumatoid arthritis     Sleep apnea     Thyroid  disease     Ventricular ectopy          Past Surgical History:   Procedure Laterality Date    HX BREAST BIOPSY  2002    FNA x 3 bilaterally    HX CYST INCISION AND DRAINAGE      about 2000    HX HYSTERECTOMY  1980    TAH; Partial    HX PARATHYROIDECTOMY      HX PARTIAL THYROIDECTOMY      parathyroid  right lube    HX TONSIL AND ADENOIDECTOMY      HX TONSIL AND ADENOIDECTOMY      HX TUBAL LIGATION      HX WRIST FRACTURE TX Left     about 2004     LITHOTRIPSY       Social History     Socioeconomic History    Marital status: Married   Tobacco Use    Smoking status: Never    Smokeless tobacco: Never   Vaping Use    Vaping status: Never Used   Substance and Sexual Activity    Alcohol use: No     Alcohol/week: 0.0 standard drinks of alcohol    Drug use: Never    Sexual activity: Not Currently     Birth control/protection: Post-menopausal, Surgical     Family Medical History:       Problem Relation (Age of Onset)    Breast Cancer Daughter (96)    Cancer Other    Coronary Artery Disease Other    Diabetes Sister, Brother, Daughter    Hypertension (High Blood Pressure) Mother, Sister, Brother    Ovarian Cancer Maternal cousin (72)    Stroke Maternal Grandmother    Thyroid  Disease Sister, Brother              Review of Systems:  Review of Systems   Constitutional: Positive for malaise/fatigue. Negative for chills and fever.   Cardiovascular:  Positive for leg swelling. Negative for chest pain, near-syncope, orthopnea, palpitations, paroxysmal nocturnal dyspnea and syncope.   Respiratory:  Negative for cough and shortness of breath.    Endocrine: Negative for cold intolerance and heat intolerance.   Hematologic/Lymphatic: Negative for bleeding problem.   Musculoskeletal:  Positive for arthritis.  Negative for gout.   Gastrointestinal:  Negative for abdominal pain, nausea  and vomiting.   Neurological:  Negative for dizziness and weakness.   Psychiatric/Behavioral:  Negative for depression. The patient is not nervous/anxious.           Physical Examination:  BP (!) 170/90 (Site: Right Arm, Patient Position: Sitting, Cuff Size: Adult Small)   Ht 1.651 m (5' 5)   Wt 76.2 kg (168 lb)   LMP 12/01/1980   SpO2 96%   BMI 27.96 kg/m         Physical Exam  Constitutional:       General: She is not in acute distress.  Neck:      Vascular: No carotid bruit.      Comments: No JVD  Cardiovascular:      Rate and Rhythm: Normal rate and regular rhythm.      Heart sounds: No murmur heard.  Pulmonary:      Effort: No respiratory distress.      Breath sounds: No wheezing, rhonchi or rales.   Abdominal:      General: There is no distension.      Palpations: Abdomen is soft.   Musculoskeletal:         General: Swelling (1+ bilateral lower extremity edema) present.   Neurological:      General: No focal deficit present.      Mental Status: She is alert.   Psychiatric:         Behavior: Behavior normal.             Orders Placed This Encounter    BASIC METABOLIC PANEL    MAGNESIUM    NT-PROBNP    ECG - In Clinic (Non-[facility] )    PULMONARY FUNCTION TESTING - ADULT - PERFORM AT [facility]    furosemide  (LASIX ) 20 mg Oral Tablet    potassium chloride  (KLOR-CON ) 10 mEq Oral Tablet Sustained Release       There are no discontinued medications.    Assessment and Plan:  Assessment/Plan   1. Paroxysmal atrial fibrillation (CMS HCC)    2. Heart failure with preserved ejection fraction (HFpEF), unspecified HF chronicity    3. Primary hypertension    4. Mixed hyperlipidemia    5. Nonsmoker    6. CKD (chronic kidney disease)    7. Thyroid  disease    8. OSA on CPAP        Ms. Ostrander has a history of coronary artery disease and has no anginal or anginal equivalent symptoms.  She had recent hospital admission for severe hypertension and  possible exacerbation heart failure with preserved ejection fraction.  She has continued lower extremity edema which is likely multifactorial and due in part to chronic kidney disease, amlodipine , venous insufficiency.  We will add low-dose furosemide  20 mg daily with 10 mEq of potassium daily.  She will be given an order to check BMP, magnesium, BNP in 1 week.  I have also recommended she check with her nephrologist before starting it.  He will continue to monitor blood pressure at home and follow up with Nephrology for management.  If she had abnormal thyroid  function recently and will follow up with the primary care doctor.  She was asked to contact us  or go to the emergency room for new or worsening symptoms.  She will be scheduled for a follow-up visit in 6-8 weeks.  We will see her sooner if needed.      Thank you for allowing us  to participate in care of your patient.  If we can be of further  assistance in her care at any time please let us  know.      Sincerely,     Morene LITTIE Miu, PA-C      Follow up:  Return in about 6 weeks (around 11/21/2024).        Morene Miu, PA-C  [facility]  Cardiology  [facility]    A portion of this documentation may have been generated using [facility] voice recognition software and may contain syntax/voice recognition errors.              [1]   Allergies  Allergen Reactions    Adhesive Rash     Needs tape for SENSITIVE SKIN    Bactrim [Sulfamethoxazole-Trimethoprim]     Ciprofloxacin     Digoxin     Plaquenil [Hydroxychloroquine]     Simvastatin     Sulfa (Sulfonamides)    [2]   Current Outpatient Medications:     ACCRUFER 30 mg Oral Capsule, Take 1 Capsule (30 mg total) by mouth Twice daily, Disp: , Rfl:     acetaminophen  (ARTHRITIS PAIN RELIEF) 650 mg Oral Tablet Sustained Release, Take 1 Tablet (650 mg total) by mouth Every 8 hours as needed for Pain, Disp: , Rfl:     Alendronate  (FOSAMAX ) 35 mg Oral Tablet, Take 1 Tablet (35 mg total) by mouth Every 7  days, Disp: , Rfl:     amiodarone  (PACERONE ) 100 mg Oral Tablet, Take 1 Tablet (100 mg total) by mouth Daily, Disp: 90 Tablet, Rfl: 1    amLODIPine  (NORVASC) 5 mg Oral Tablet, Take 1 Tablet (5 mg total) by mouth Once a day (Patient taking differently: Take 2 Tablets (10 mg total) by mouth Daily), Disp: 90 Tablet, Rfl: 3    cholecalciferol, vitamin D3, 1,000 unit Oral Tablet, Take 2 Tablets (2,000 Units total) by mouth Twice daily, Disp: , Rfl:     cloNIDine  HCL (CATAPRES ) 0.1 mg Oral Tablet, Take 1 Tablet (0.1 mg total) by mouth Twice daily, Disp: 180 Tablet, Rfl: 2    cyanocobalamin (VITAMIN B 12) 1,000 mcg Oral Tablet, Take 1 Tablet (1,000 mcg total) by mouth Once a day, Disp: , Rfl:     ELIQUIS  2.5 mg Oral Tablet, Take 1 tablet by mouth twice daily (Patient taking differently: Take 2 Tablets (5 mg total) by mouth Twice daily), Disp: 180 Tablet, Rfl: 2    furosemide  (LASIX ) 20 mg Oral Tablet, Take 1 Tablet (20 mg total) by mouth Daily, Disp: 90 Tablet, Rfl: 3    levothyroxine  (SYNTHROID ) 75 mcg Oral Tablet, Take 1 Tab (75 mcg total) by mouth Every morning, Disp: 90 Tab, Rfl: 4    lisinopriL  (PRINIVIL ) 40 mg Oral Tablet, Take 1 Tablet (40 mg total) by mouth Once a day, Disp: , Rfl:     polysaccharide iron  complex (FERREX 150) 150 mg iron  Oral Capsule, Take 1 capsule by mouth once daily, Disp: 90 Capsule, Rfl: 3    potassium chloride  (KLOR-CON ) 10 mEq Oral Tablet Sustained Release, Take 1 Tablet (10 mEq total) by mouth Daily, Disp: 90 Tablet, Rfl: 3

## 2024-10-10 NOTE — Procedures (Signed)
 CARDIOLOGY [facility] [place], [facility]  [address] [place] [address]    Procedure Note    Name: Cynthia Barrera MRN:  [id number]   Date: 10/10/2024 DOB:  1941-10-23 (83 y.o.)         ECG - In Clinic (Non[facility] )    Performed by: Chinita Katz, PA-C  Authorized by: Chinita Katz, PA-C      Sinus rhythm, left ventricular hypertrophy, ST-T abnormalities.    Katz Chinita, PA-C

## 2024-10-13 ENCOUNTER — Other Ambulatory Visit: Payer: Self-pay

## 2024-10-13 ENCOUNTER — Ambulatory Visit
Admission: RE | Admit: 2024-10-13 | Discharge: 2024-10-13 | Disposition: A | Discharge status: Still In Hospital | Payer: Self-pay | Source: Ambulatory Visit | Attending: PAIN MANAGEMENT | Admitting: PAIN MANAGEMENT

## 2024-10-13 DIAGNOSIS — M542 Cervicalgia: Secondary | ICD-10-CM | POA: Insufficient documentation

## 2024-10-13 DIAGNOSIS — G894 Chronic pain syndrome: Secondary | ICD-10-CM | POA: Insufficient documentation

## 2024-10-13 DIAGNOSIS — M791 Myalgia, unspecified site: Secondary | ICD-10-CM | POA: Insufficient documentation

## 2024-10-13 NOTE — PT Treatment (Addendum)
 [place] [facility]- [facility]  PT Neck Treatment    Patient Name: Cynthia Barrera  Patient DOB: [birth year]    Referring physician: Evalene JONELLE Roche, MD    Return to Doctor: 11/7 MRI of spine, 11/14 Return to Dr. Roche    Diagnosis: G89.4 Chronic Pain Syndrome, Myalgia M79.10    Physical Therapy Visit    Time in: 1101  Time out: 1140  Visit #: 2 per dar 2/?  Chiropractic Visits:  several earlier this year  POC Due: 11/14/2024    Did PT communicate with PTA:  Yes  Precautions/Restrictions: ADHESIVE TAPE ALLERGY has some BP issues so only monitor if symptomatic, needs cervical and shoulder loosening x 4 visits then progress into strengthening and endurance, can also do soft tissue.  Her goal is to be able to do activities with her arms up to 20 minutes before needing to rest, I did her HEP on medbridge she does not have a print out yet, please review and give    Treatment Note      Subjective: Patient notes pain level can get up to a 10 in her neck and back. Noted pain increases with activity. Had to sit down multiple times to make a meal and do the dishes.     Objective: All treatment performed one-on-one except as noted with unattended description.  Patient returns to PT for chronic pain syndrome and myalgia.      Therapeutic exercises: 29 minutes + subjective (2 units)   Manual Therapy: 10 minutes (1 unit)   Total tx time: 39 minutes (3 units)     BP at beginning of session 166/72    Seated:  Cervical extension with towel 10x 3 sec hold  Cervical Rotation 10x each   Cervical Sidebending 10x each   Shoulder shrugs alternating 20x each  Shoulder circles 2x10     Standing:   PB scap squeeze 10x   Wand flexion 10x  Servers glides 10x   Wand extension 2x10  Wand IR 2x10    Manual Therapy:  Time: 10 minutes to decrease muscle tone/adhesions, increase soft-tissue extensibility, and to decrease pain.   Patient Position: Seated  Notes: Soft-tissue mobilization (STM) to bilateral upper traps and  cervical paraspinals        Assessment: Focused session on establishing HEP and exercise program, with good response from patient. Alternating standing and seated exercises to allow for rest breaks throughout the session. Patient did well. Moderate verbal and visual cues required to correct form and technique with exercises. 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 Concluded session with manual treatment, patient responding well but did present with some areas of tenderness. Tolerated mild to moderate pressure throughout. Will benefit from PT to address ROM, strength and pain deficits to improve tolerance with completing functional tasks. Patient has good potential to meet PT goals.     Short Term Goals:  In 1-4  weeks:  1)  Patient will report benefit to 4 neubie protocols (cervical and shoulder).  2)  Patient will report benefit from establish HEP.       Long Term Goal:  In 8 weeks  1) Increase MMT's to 4/5> in scapular muscles and shoulder musculature (long lever flexion and abduction 4-/5 at eval and rhomboid 3+/5, lower trap 3+/5) to assist with cleaning and cooking up to 20 minutes before having a rest break.  2)  Patient will participate in muscular endurance activities  to assist goal 1.  3)  Patient will improve AROM of the cervical spine to 50 deg rotation> to be able to turn head and talk with more ease (at eval 40 and 45 deg).  4) Patient will be able to carry out HEP as established to continue to build muscular endurance once formal PT is completed.        Plan: Continue PT POC           Home Exercise Program History (HEP)     Access Code: [email or web address]  URL: [email or web address]  Date: 09/19/2024  Prepared by: Clotilda Cooler    Exercises  - Supine Cervical Rotation AROM on Pillow  - 2-3 x daily - 2 sets - 10 reps - 2-3 seconds hold  - Supine Cervical Retraction with Towel  - 2-3 x daily - 2 sets - 10 reps - 2-3 seconds hold  - Seated Scapular Retraction  - 2-3 x  daily - 2 sets - 10 reps - 2-3 seconds hold  - Seated Thoracic Lumbar Extension  - 2-3 x daily - 2 sets - 10 reps - 2-3 seconds hold    Exercises/Modalities Not Performed This Treatment:          Laymon Hummer, PTA [phone]

## 2024-10-14 ENCOUNTER — Ambulatory Visit: Payer: Self-pay | Attending: NURSE PRACTITIONER | Admitting: NURSE PRACTITIONER

## 2024-10-14 ENCOUNTER — Ambulatory Visit (HOSPITAL_BASED_OUTPATIENT_CLINIC_OR_DEPARTMENT_OTHER): Payer: Self-pay | Admitting: PAIN MANAGEMENT

## 2024-10-14 ENCOUNTER — Encounter (HOSPITAL_BASED_OUTPATIENT_CLINIC_OR_DEPARTMENT_OTHER): Payer: Self-pay | Admitting: NURSE PRACTITIONER

## 2024-10-14 VITALS — BP 202/88 | HR 69 | Ht 65.0 in | Wt 168.0 lb

## 2024-10-14 DIAGNOSIS — M62838 Other muscle spasm: Secondary | ICD-10-CM | POA: Insufficient documentation

## 2024-10-14 DIAGNOSIS — M791 Myalgia, unspecified site: Secondary | ICD-10-CM | POA: Insufficient documentation

## 2024-10-17 ENCOUNTER — Ambulatory Visit (INDEPENDENT_AMBULATORY_CARE_PROVIDER_SITE_OTHER): Payer: Self-pay

## 2024-10-17 ENCOUNTER — Encounter (INDEPENDENT_AMBULATORY_CARE_PROVIDER_SITE_OTHER)

## 2024-10-19 ENCOUNTER — Other Ambulatory Visit: Payer: Self-pay

## 2024-10-19 ENCOUNTER — Ambulatory Visit (HOSPITAL_BASED_OUTPATIENT_CLINIC_OR_DEPARTMENT_OTHER)
Admission: RE | Admit: 2024-10-19 | Discharge: 2024-10-19 | Disposition: A | Discharge status: Still In Hospital | Payer: Self-pay | Source: Ambulatory Visit | Attending: PAIN MANAGEMENT | Admitting: PAIN MANAGEMENT

## 2024-10-19 NOTE — PT Treatment (Signed)
 [place] [facility]- [facility]  PT Neck Treatment    Patient Name: Cynthia Barrera  Patient DOB: 08-13-1941    Referring physician: Evalene JONELLE Roche, MD    Return to Doctor: 11/7 MRI of spine, 11/14 Return to Dr. Roche    Diagnosis: G89.4 Chronic Pain Syndrome, Myalgia M79.10    Physical Therapy Visit    Time in: 1100  Time out: 1142  Visit #: 3 per dar 3/?  Chiropractic Visits:  several earlier this year  POC Due: 11/14/2024    Did PT communicate with PTA:  Yes  Precautions/Restrictions: ADHESIVE TAPE ALLERGY has some BP issues so only monitor if symptomatic, needs cervical and shoulder loosening x 4 visits then progress into strengthening and endurance, can also do soft tissue.  Her goal is to be able to do activities with her arms up to 20 minutes before needing to rest, I did her HEP on medbridge she does not have a print out yet, please review and give    Treatment Note      Subjective: Patient states her BP is good in the morning and then about 2:00 it goes up to 170 but they gave her something to take that makes it better. Pain today is so far okay because she hasn't tried to do anything. Bothered her a little bit when she made the bed. But there for awhile  she couldn't even make the bed without sitting down. 2/10 pain right now. No BP symptoms today.    Objective: All treatment performed one-on-one except as noted with unattended description.  Patient returns to PT for chronic pain syndrome and myalgia.      Neuro Re-ed: 32 minutes (2 units)   Manual Therapy: 10 minutes (1 unit)   Total tx time: 42 minutes (3 units)          NEUBIE CERVICAL LOOSENING PROTOCOL  (Purpose: to decreased painful inputs to central nervous system, improve kinesthetic awareness of the cervical spine and improve motor control and proprioception so as to reduce compensatory mechanisms and improve head on neck posture)    18 minutes  (all time is one-on-one including set up, education and intensity  adjustments by skilled neubie certified clinician)  500 hz  Starting intensity 43%    Channel 1:  Large sized pad with red lead placed at Left upper trapezius muscle belly    Small sized pad with black lead place at Left occiput  Channel 2: Large sized pad with red lead placed at Right upper trapezius muscle belly    Small sized pad with black lead placed at Right occiput    Patient then taken through the following exercises with the purpose of decreasing tone and increasing muscle extensibility:    AROM 3 rounds each:  Cervical ext/flexion 1 minute  Cervical rotation 1 minute  Cervical sidebending 1 minute  x1/4         Neubi Shoulder Loosening Protocol  (Purpose: decrease painful inputs to central nervous system while improving kinesthetic awareness of the shoulder to restore normal movement patterns such reaching and grooming and improve overall postural control with upper extremity gross motor patterns)  14 minutes (Neuromuscular Reeducation)(all time is one-on-one including set up, education and intensity adjustments by skilled neubie certified clinician)  500hz   Intensity: 40%   Channel 1:2x4-sized pad with Red/positive polarity placed R UT                   2x4-sized  pad with Black/negative polarity placed R Medial scap  Channel 2:2x4-sized pad with Red/positive polarity placed L UT                   2x4-sized pad with Black/negative polarity placed L medial scap     One-on-one treatment for set-up and education with the goal of reaching 5/10 pain/intensity or greater if tolerated for Central nervous system retraining goals while being guided through the following exercises:      PB scap squeeze 2x10  Wand flexion x10   Servers glides x10  Wand IR x10  Shoulder shrugs 10x   Shoulder circles 2x10   Seated scap retractions x 20      Manual Therapy:  Time: 10 minutes to decrease muscle tone/adhesions, increase soft-tissue extensibility, and to decrease pain.   Patient Position: seated  Notes: Soft-tissue  mobilization (STM) to bilateral UT to medial scap area. Mild pressure to tolerance.       NOT TODAY  Seated:  Cervical extension with towel 10x 3 sec hold  Cervical Rotation 10x each   Cervical Sidebending 10x each   Shoulder shrugs alternating 20x each  Shoulder circles 2x10     Standing:   PB scap squeeze 10x   Wand flexion 10x  Servers glides 10x   Wand extension 2x10  Wand IR 2x10      Assessment: Patient presents with reports of less pain today but states she has not done anything yet today to make it hurt. Initiated neubie cervical and shoulder protocols with patient tolerating both very well. Positive feedback reported with both. Followed with STM to bilateral UT/scap areas. Tenderness reported especially on the R so mild pressure used to tolerance. Will benefit from PT to address ROM, strength and pain deficits to improve tolerance with completing functional tasks. Patient has good potential to meet PT goals.     Short Term Goals:  In 1-4  weeks:  1)  Patient will report benefit to 4 neubie protocols (cervical and shoulder).  2)  Patient will report benefit from establish HEP.       Long Term Goal:  In 8 weeks  1) Increase MMT's to 4/5> in scapular muscles and shoulder musculature (long lever flexion and abduction 4-/5 at eval and rhomboid 3+/5, lower trap 3+/5) to assist with cleaning and cooking up to 20 minutes before having a rest break.  2)  Patient will participate in muscular endurance activities to assist goal 1.  3)  Patient will improve AROM of the cervical spine to 50 deg rotation> to be able to turn head and talk with more ease (at eval 40 and 45 deg).  4) Patient will be able to carry out HEP as established to continue to build muscular endurance once formal PT is completed.        Plan: Continue PT POC           Home Exercise Program History (HEP)     Access Code: [email or web address]  URL: [email or web address]  Date: 09/19/2024  Prepared by: Clotilda Cooler    Exercises  - Supine Cervical Rotation  AROM on Pillow  - 2-3 x daily - 2 sets - 10 reps - 2-3 seconds hold  - Supine Cervical Retraction with Towel  - 2-3 x daily - 2 sets - 10 reps - 2-3 seconds hold  - Seated Scapular Retraction  - 2-3 x daily - 2 sets - 10 reps - 2-3 seconds hold  - Seated Thoracic Lumbar Extension  -  2-3 x daily - 2 sets - 10 reps - 2-3 seconds hold    Exercises/Modalities Not Performed This Treatment:          Milana Salay, PTA [phone]

## 2024-10-21 ENCOUNTER — Ambulatory Visit
Admission: RE | Admit: 2024-10-21 | Discharge: 2024-10-21 | Disposition: A | Discharge status: Still In Hospital | Payer: Self-pay | Source: Ambulatory Visit | Attending: PAIN MANAGEMENT | Admitting: PAIN MANAGEMENT

## 2024-10-21 ENCOUNTER — Other Ambulatory Visit: Payer: Self-pay

## 2024-10-21 NOTE — PT Treatment (Signed)
 [place] [facility]- [facility]  PT Neck Treatment    Patient Name: Cynthia Barrera  Patient DOB: February 03, 1941    Referring physician: Evalene JONELLE Roche, MD    Return to Doctor: 11/7 MRI of spine, 11/14 Return to Dr. Roche    Diagnosis: G89.4 Chronic Pain Syndrome, Myalgia M79.10    Physical Therapy Visit    Time in: 1100  Time out: 1142  Visit #: 4   Chiropractic Visits:  several earlier this year  POC Due: 11/14/2024    Did PT communicate with PTA:  Yes  Precautions/Restrictions: ADHESIVE TAPE ALLERGY has some BP issues so only monitor if symptomatic, needs cervical and shoulder loosening x 4 visits then progress into strengthening and endurance, can also do soft tissue.  Her goal is to be able to do activities with her arms up to 20 minutes before needing to rest, I did her HEP on medbridge she does not have a print out yet, please review and give    Treatment Note      Subjective: Patient states her BP was good today. 2/10 pain again today. Not up doing anything it doesn't hurt that bad. Made cranberry salad yesterday and had to take a few breaks during.     Objective: All treatment performed one-on-one except as noted with unattended description.  Patient returns to PT for chronic pain syndrome and myalgia.      Neuro Re-ed: 32 minutes (2 units)   Manual Therapy: 10 minutes (1 unit)   Total tx time: 42 minutes (3 units)          NEUBIE CERVICAL LOOSENING PROTOCOL  (Purpose: to decreased painful inputs to central nervous system, improve kinesthetic awareness of the cervical spine and improve motor control and proprioception so as to reduce compensatory mechanisms and improve head on neck posture)    18 minutes  (all time is one-on-one including set up, education and intensity adjustments by skilled neubie certified clinician)  500 hz  Starting intensity 49%    Channel 1:  Large sized pad with red lead placed at Left upper trapezius muscle belly    Small sized pad with black lead place at  Left occiput  Channel 2: Large sized pad with red lead placed at Right upper trapezius muscle belly    Small sized pad with black lead placed at Right occiput    Patient then taken through the following exercises with the purpose of decreasing tone and increasing muscle extensibility:    AROM 3 rounds each:  Cervical ext/flexion 1 minute  Cervical rotation 1 minute  Cervical sidebending 1 minute  x2/4         Neubi Shoulder Loosening Protocol  (Purpose: decrease painful inputs to central nervous system while improving kinesthetic awareness of the shoulder to restore normal movement patterns such reaching and grooming and improve overall postural control with upper extremity gross motor patterns)  14 minutes (Neuromuscular Reeducation)(all time is one-on-one including set up, education and intensity adjustments by skilled neubie certified clinician)  500hz   Intensity: 54%   Channel 1:2x4-sized pad with Red/positive polarity placed R UT                   2x4-sized pad with Black/negative polarity placed R Medial scap  Channel 2:2x4-sized pad with Red/positive polarity placed L UT                   2x4-sized pad with Black/negative polarity placed L  medial scap     One-on-one treatment for set-up and education with the goal of reaching 5/10 pain/intensity or greater if tolerated for Central nervous system retraining goals while being guided through the following exercises:      PB scap squeeze 2x10  Wand flexion 2x10 (increased)  Servers glides 2x10 (increased)  Wand IR x20  Shoulder shrugs 10x   Shoulder circles 2x10   Seated scap retractions x 20  X2/4    Manual Therapy:  Time: 10 minutes to decrease muscle tone/adhesions, increase soft-tissue extensibility, and to decrease pain.   Patient Position: seated  Notes: Soft-tissue mobilization (STM) to bilateral UT to medial scap area. Mild pressure to tolerance.       NOT TODAY  Seated:  Cervical extension with towel 10x 3 sec hold  Cervical Rotation 10x each   Cervical  Sidebending 10x each   Shoulder shrugs alternating 20x each  Shoulder circles 2x10     Standing:   PB scap squeeze 10x   Wand flexion 10x  Servers glides 10x   Wand extension 2x10  Wand IR 2x10      Assessment: Patient presents with reports of pain and needing to take breaks when making cranberry sauce yesterday. Nothing more than 2/10 today but hasn't done anything. Continued neubie cervical and shoulder protocols with patient tolerating both very well. Positive feedback reported with both. Followed with STM to bilateral UT/scap areas. Less tenderness reported but continues to need milder pressure. Will continue to progress per POC.     Short Term Goals:  In 1-4  weeks:  1)  Patient will report benefit to 4 neubie protocols (cervical and shoulder).  2)  Patient will report benefit from establish HEP.       Long Term Goal:  In 8 weeks  1) Increase MMT's to 4/5> in scapular muscles and shoulder musculature (long lever flexion and abduction 4-/5 at eval and rhomboid 3+/5, lower trap 3+/5) to assist with cleaning and cooking up to 20 minutes before having a rest break.  2)  Patient will participate in muscular endurance activities to assist goal 1.  3)  Patient will improve AROM of the cervical spine to 50 deg rotation> to be able to turn head and talk with more ease (at eval 40 and 45 deg).  4) Patient will be able to carry out HEP as established to continue to build muscular endurance once formal PT is completed.        Plan: Continue PT POC           Home Exercise Program History (HEP)     Access Code: [email or web address]  URL: [email or web address]  Date: 09/19/2024  Prepared by: Clotilda Cooler    Exercises  - Supine Cervical Rotation AROM on Pillow  - 2-3 x daily - 2 sets - 10 reps - 2-3 seconds hold  - Supine Cervical Retraction with Towel  - 2-3 x daily - 2 sets - 10 reps - 2-3 seconds hold  - Seated Scapular Retraction  - 2-3 x daily - 2 sets - 10 reps - 2-3 seconds hold  - Seated Thoracic Lumbar Extension  - 2-3  x daily - 2 sets - 10 reps - 2-3 seconds hold    Exercises/Modalities Not Performed This Treatment:          Tuyen Uncapher, PTA [phone]

## 2024-10-25 ENCOUNTER — Ambulatory Visit (HOSPITAL_COMMUNITY): Payer: Self-pay

## 2024-10-31 ENCOUNTER — Ambulatory Visit
Admission: RE | Admit: 2024-10-31 | Discharge: 2024-10-31 | Disposition: A | Discharge status: Still In Hospital | Payer: Self-pay | Source: Ambulatory Visit | Attending: PAIN MANAGEMENT | Admitting: PAIN MANAGEMENT

## 2024-10-31 ENCOUNTER — Other Ambulatory Visit: Payer: Self-pay

## 2024-10-31 DIAGNOSIS — M791 Myalgia, unspecified site: Secondary | ICD-10-CM | POA: Insufficient documentation

## 2024-10-31 DIAGNOSIS — M542 Cervicalgia: Secondary | ICD-10-CM | POA: Insufficient documentation

## 2024-10-31 DIAGNOSIS — G894 Chronic pain syndrome: Secondary | ICD-10-CM | POA: Insufficient documentation

## 2024-10-31 NOTE — PT Treatment (Addendum)
 [place] [facility]- [facility]  PT Neck Treatment    Patient Name: Cynthia Barrera  Patient DOB: Jul 11, 1941    Referring physician: Evalene JONELLE Roche, MD    Return to Doctor: 12/5 for cervical spine injections (trigger point)    Diagnosis: G89.4 Chronic Pain Syndrome, Myalgia M79.10    Physical Therapy Visit    Time in: 1104  Time out: 1145  Visit #: 5   Chiropractic Visits:  several earlier this year  POC Due: 11/14/2024    Did PT communicate with PTA:  Yes  Precautions/Restrictions: ADHESIVE TAPE ALLERGY has some BP issues so only monitor if symptomatic, needs cervical and shoulder loosening x 4 visits then progress into strengthening and endurance, can also do soft tissue.  Her goal is to be able to do activities with her arms up to 20 minutes before needing to rest, I did her HEP on medbridge she does not have a print out yet, please review and give    Treatment Note      Subjective: Patient states her BP lower number is high in the 100's she did take her clonidine  is what she took to assist this.  Patient reports she is not able to do anymore than what she was doing.  Patient notes she made potato soup and she had to sit a lot.  Yesterday she felt good and made apple pie-patient notes this took her forever.  Pain level today is 2/10 in the upper trap.   Patient feels only 15% improved from [facility].      Objective: All treatment performed one-on-one except as noted with unattended description.  Patient returns to PT for chronic pain syndrome and myalgia.      Therapeutic exercise 20 minutes per reassessment and subjective  Manual Therapy: 10 minutes (1 unit)   Estim unattended to suggest set up and education for home tens unit 10 minutes  Total tx time: 40 minutes (3 units)       AROM cervical spine  Extension 35 deg  Rotation R 60 deg L 63 deg     MMT's  Shoulder flexion R 4+/5 L 4+/5  Shoulder abduction R 4+/5 L 4+/5  Shoulder ER R 4+/5 L 4+/5  Shoulder IR R 5/5 L 5/5    Scapular  muscles (bent over position)  Rhomboids R 4+/5 L 4/5  Mid trap 4+/5 L 4/5  Lower trap 4+/5 L 4/5      Electrical Stimulation Type Time Location Parameters With Other Modality Patient Position   2 channel pre mod to decrease pain. 10 minutes  25% intensity R upper trap and L upper trap  2 channel  No  seated          Manual Therapy:  Time: 10 minutes to decrease muscle tone/adhesions, increase soft-tissue extensibility, and to decrease pain.   Patient Position: seated  Notes: Soft-tissue mobilization (STM) to bilateral UT to medial scap area. Mild pressure to tolerance.       NOT TODAY  Seated:  Cervical extension with towel 10x 3 sec hold  Cervical Rotation 10x each   Cervical Sidebending 10x each   Shoulder shrugs alternating 20x each  Shoulder circles 2x10     Standing:   PB scap squeeze 10x   Wand flexion 10x  Servers glides 10x   Wand extension 2x10  Wand IR 2x10      Assessment: Since the initial evaluation / previous progress update, pt has made an improvement  in her ROM of her cervical spine and her scapular musculature strength.  She still continues to complain of pain as her main issue which limits her activity tolerance plus she still continues to have high tone/spasm in her neck mainly upper traps.  Discussed her using her daughters home tens unit, we did discuss set up at home with verbal guidance and demonstration.  She could also use this while she is working in her kitchen bc they are typically portable.  At this time she is going to D/C from PT and trial the trigger point injections and continue to use her TENS unit.  No s/s of high BP during tx today when asked throughout.  Patient left stable and in care of husband.  Is to call if she needs any assist with set up of her tens unit.        Physical Therapy Progress Note      Short Term Goals:  In 1-4  weeks:  1)  Patient will report benefit to 4 neubie protocols (cervical and shoulder).  2)  Patient will report benefit from establish HEP.       Long  Term Goal:  In 8 weeks  1) Increase MMT's to 4/5> in scapular muscles and shoulder musculature (long lever flexion and abduction 4-/5 at eval and rhomboid 3+/5, lower trap 3+/5) to assist with cleaning and cooking up to 20 minutes before having a rest break.(Goal  in progress was able to make apple pie 2 hours, rested between)  2)  Patient will participate in muscular endurance activities to assist goal 1.(goal met 10/31/2024 compliant with HEP)   3)  Patient will improve AROM of the cervical spine to 50 deg rotation> to be able to turn head and talk with more ease (at eval 40 and 45 deg).(Goal exceeded 10/31/2024)  4) Patient will be able to carry out HEP as established to continue to build muscular endurance once formal PT is completed.  (Goal met 10/31/2024)      Plan:  D/C skilled care, continue HEP, use daughter's tens unit          Home Exercise Program History (HEP)     Access Code: [email or web address]  URL: [email or web address]  Date: 09/19/2024  Prepared by: Clotilda Cooler    Exercises  - Supine Cervical Rotation AROM on Pillow  - 2-3 x daily - 2 sets - 10 reps - 2-3 seconds hold  - Supine Cervical Retraction with Towel  - 2-3 x daily - 2 sets - 10 reps - 2-3 seconds hold  - Seated Scapular Retraction  - 2-3 x daily - 2 sets - 10 reps - 2-3 seconds hold  - Seated Thoracic Lumbar Extension  - 2-3 x daily - 2 sets - 10 reps - 2-3 seconds hold    Exercises/Modalities Not Performed This Treatment:      Clotilda Cooler, [phone]

## 2024-11-01 ENCOUNTER — Ambulatory Visit
Admission: RE | Admit: 2024-11-01 | Discharge: 2024-11-01 | Disposition: A | Source: Ambulatory Visit | Attending: Physician Assistant

## 2024-11-01 DIAGNOSIS — I5030 Unspecified diastolic (congestive) heart failure: Secondary | ICD-10-CM | POA: Insufficient documentation

## 2024-11-01 DIAGNOSIS — I480 Paroxysmal atrial fibrillation: Secondary | ICD-10-CM

## 2024-11-01 MED ORDER — ALBUTEROL SULFATE 2.5 MG/3 ML (0.083 %) SOLUTION FOR NEBULIZATION
2.5000 mg | INHALATION_SOLUTION | Freq: Once | RESPIRATORY_TRACT | Status: AC
  Administered 2024-11-01: 2.5 mg via RESPIRATORY_TRACT

## 2024-11-02 NOTE — Procedures (Addendum)
 NAME:  Cynthia Barrera, [name]  HOSPITAL NUMBER:  [id number]  DATE OF SERVICE:  11/01/2024  DOB:  27-Feb-1941  SEX:  F      The PFT shows normal spirometry.  No significant bronchodilator response.  Mild restriction noted by lung volumes.  Diffusion capacity mildly reduced.  Clinically correlate.        Candiss Jumper, MD            DD:  11/02/2024 14:02:15  DT:  11/02/2024 17:54:23 RO  D#:  [id number]

## 2024-11-03 ENCOUNTER — Ambulatory Visit (INDEPENDENT_AMBULATORY_CARE_PROVIDER_SITE_OTHER): Payer: Self-pay | Admitting: Physician Assistant

## 2024-11-03 ENCOUNTER — Ambulatory Visit (HOSPITAL_BASED_OUTPATIENT_CLINIC_OR_DEPARTMENT_OTHER): Payer: Self-pay

## 2024-11-07 ENCOUNTER — Ambulatory Visit (HOSPITAL_BASED_OUTPATIENT_CLINIC_OR_DEPARTMENT_OTHER): Payer: Self-pay

## 2024-11-11 ENCOUNTER — Ambulatory Visit (HOSPITAL_BASED_OUTPATIENT_CLINIC_OR_DEPARTMENT_OTHER): Payer: Self-pay

## 2024-11-15 ENCOUNTER — Encounter (HOSPITAL_BASED_OUTPATIENT_CLINIC_OR_DEPARTMENT_OTHER): Payer: Self-pay | Admitting: PAIN MANAGEMENT

## 2024-11-15 ENCOUNTER — Other Ambulatory Visit: Payer: Self-pay

## 2024-11-15 ENCOUNTER — Ambulatory Visit
Admission: RE | Admit: 2024-11-15 | Discharge: 2024-11-15 | Disposition: A | Source: Ambulatory Visit | Attending: PAIN MANAGEMENT | Admitting: PAIN MANAGEMENT

## 2024-11-15 ENCOUNTER — Ambulatory Visit (HOSPITAL_BASED_OUTPATIENT_CLINIC_OR_DEPARTMENT_OTHER)

## 2024-11-15 ENCOUNTER — Encounter (HOSPITAL_BASED_OUTPATIENT_CLINIC_OR_DEPARTMENT_OTHER): Admission: RE | Disposition: A | Payer: Self-pay | Source: Ambulatory Visit | Attending: PAIN MANAGEMENT

## 2024-11-15 ENCOUNTER — Ambulatory Visit (HOSPITAL_BASED_OUTPATIENT_CLINIC_OR_DEPARTMENT_OTHER): Admitting: PAIN MANAGEMENT

## 2024-11-15 DIAGNOSIS — M62838 Other muscle spasm: Secondary | ICD-10-CM | POA: Insufficient documentation

## 2024-11-15 DIAGNOSIS — M7918 Myalgia, other site: Secondary | ICD-10-CM | POA: Insufficient documentation

## 2024-11-15 DIAGNOSIS — M791 Myalgia, unspecified site: Secondary | ICD-10-CM | POA: Insufficient documentation

## 2024-11-15 DIAGNOSIS — G894 Chronic pain syndrome: Secondary | ICD-10-CM | POA: Insufficient documentation

## 2024-11-15 HISTORY — PX: TRIGGER POINT INJECTION: SHX2580

## 2024-11-15 SURGERY — PAIN SERVICE INJECTION TRIGGER POINT 3 OR MORE LEVELS
Anesthesia: Local (Nurse-Monitored) | Site: Neck | Laterality: Bilateral | Wound class: Clean Wound: Uninfected operative wounds in which no inflammation occurred

## 2024-11-15 MED ORDER — BUPIVACAINE (PF) 0.25 % (2.5 MG/ML) INJECTION SOLUTION
Freq: Once | INTRAMUSCULAR | Status: DC | PRN
  Administered 2024-11-15: 9 mL via INTRAMUSCULAR

## 2024-11-15 MED ORDER — TRIAMCINOLONE ACETONIDE 40 MG/ML SUSPENSION FOR INJECTION
Freq: Once | INTRAMUSCULAR | Status: DC | PRN
  Administered 2024-11-15: 40 mg via INTRAMUSCULAR

## 2024-11-15 NOTE — OR Surgeon (Signed)
 OPERATIVE NOTE    Patient Name: Cynthia Barrera, Cynthia Barrera MRN:: [id number]  Date of Birth: 12/26/1940  Date of Service: 11/15/2024     Pre-Operative Diagnosis: Myofascial Pain, Muscle Spasm, Chronic Pain Syndrome    Post-Operative Diagnosis: Same    Procedure(s)/Description:  CPT: 20553     Attending Surgeon: Evalene JONELLE Roche, MD     Anesthesia Type: Local (Nurse-Monitored)     Complications:  None    Condition:  Stable    Disposition:  Recovery - hemodynamically stable.    Description of Procedure/Operation:  Procedure Performed: Bilateral Cervical (Erector Spinae, Trapezius, and Splenius Capitis) Trigger Point Injection of three or more muscle groups with Ultrasound Guidance  MEDICAL NECESSITY: The patient has tried or failed other conservative measures in the past or those measures were not appropriate. Based on the history and physical exam from previous treatments today's procedure is medically necessary.  DESCRIPTION OF PROCEDURE: Today after informed consent the patient was taken to the proper room and placed in the proper position per procedure care team and routine monitors were applied. The patient was prepped widely in a sterile fashion and the painful and intended muscles and trigger points were identified using palpation and ultrasound guidance. Next #25 needle was used to instill a total volume of 9 mL of 0.25% Bupivacaine  and 40 mg of Triamcinolone  that was divided and placed throughout the trigger points. Three or more muscle groups were injected without difficulty. The needles were flushed and removed. Sterile dressings were applied. Patient tolerated procedure well and there were no complications.  Patient was taken to recovery in stable condition. Post-procedure education was provided.  Evalene JONELLE Roche, MD

## 2024-11-15 NOTE — H&P (Signed)
 H & P updated the day of the procedure.    1.  H&P completed within 30 days of surgical procedure and has been reviewed within 24 hours of admission but prior to surgery or a procedure requiring anesthesia services, the patient has been examined, and no change has occured in the patients condition since the H&P was completed.       Change in medications: No         2.  Patient continues to be appropriate candidate for planned surgical procedure. YES    Prescott Gum, MD

## 2024-11-21 ENCOUNTER — Encounter (INDEPENDENT_AMBULATORY_CARE_PROVIDER_SITE_OTHER): Payer: Self-pay

## 2024-11-21 ENCOUNTER — Ambulatory Visit (INDEPENDENT_AMBULATORY_CARE_PROVIDER_SITE_OTHER): Admitting: Physician Assistant

## 2024-11-21 ENCOUNTER — Other Ambulatory Visit: Payer: Self-pay

## 2024-11-21 VITALS — BP 174/92 | HR 69 | Ht 60.0 in | Wt 169.0 lb

## 2024-11-21 DIAGNOSIS — N184 Chronic kidney disease, stage 4 (severe): Secondary | ICD-10-CM

## 2024-11-21 DIAGNOSIS — Z79899 Other long term (current) drug therapy: Secondary | ICD-10-CM

## 2024-11-21 DIAGNOSIS — I493 Ventricular premature depolarization: Secondary | ICD-10-CM

## 2024-11-21 DIAGNOSIS — I517 Cardiomegaly: Secondary | ICD-10-CM

## 2024-11-21 DIAGNOSIS — E039 Hypothyroidism, unspecified: Secondary | ICD-10-CM

## 2024-11-21 DIAGNOSIS — G4733 Obstructive sleep apnea (adult) (pediatric): Secondary | ICD-10-CM

## 2024-11-21 DIAGNOSIS — I10 Essential (primary) hypertension: Secondary | ICD-10-CM

## 2024-11-21 DIAGNOSIS — I480 Paroxysmal atrial fibrillation: Secondary | ICD-10-CM

## 2024-11-21 DIAGNOSIS — Z789 Other specified health status: Secondary | ICD-10-CM

## 2024-11-21 DIAGNOSIS — E782 Mixed hyperlipidemia: Secondary | ICD-10-CM

## 2024-11-21 MED ORDER — CARVEDILOL 3.125 MG TABLET: 3.1250 mg | ORAL_TABLET | Freq: Two times a day (BID) | ORAL | 3 refills | Status: AC

## 2024-11-21 NOTE — Procedures (Signed)
 CARDIOLOGY [facility] [place], [facility]  [address] [place] [address]    Procedure Note    Name: Cynthia Barrera MRN:  [id number]   Date: 11/21/2024 DOB:  1941/03/09 (83 y.o.)         ECG - In Clinic (Non-[facility] )    Performed by: Chinita Katz, PA-C  Authorized by: Chinita Katz, PA-C      Sinus rhythm, left ventricular hypertrophy.    Katz Chinita, PA-C

## 2024-11-21 NOTE — Progress Notes (Signed)
 Cardiology [facility], [facility]  [address] [place] [address]  [phone]    Cardiology  Clinic Note    Name: Cynthia Barrera   DOB: 1941-06-24  [83 y.o. female]   MRN: [id number]       Visit Date: 11/21/2024   Referring: No referring provider defined for this encounter.   PCP: Delon Silversmith, DO         Chief Complaint: Follow Up 6 Weeks      History of Present Illness   Cynthia Barrera is a 83 y.o. White female who presents for a follow-up visit. She has no history of coronary artery disease.  She has paroxysmal atrial fibrillation in his maintaining sinus rhythm with amiodarone  and is anticoagulated with Eliquis .  Nuclear stress test February 2020 was negative for ischemia or infarction with 74% ejection fraction.  She has a history of parathyroid  disease and previous parathyroidectomy and follows with endocrinology.  She also has chronic kidney disease and follows with Nephrology.       She presented to [facility] emergency room 10/05/2024 with complaints of severe hypertension and lower extremity edema.  She was diuresed and medications adjusted.  She had an echocardiogram that revealed ejection fraction of 70-75% with mild MR and TR.  Further ischemic evaluation was not recommended.  She was instructed to follow up here.    She was seen here 10/10/2024.  She will had continued lower extremity edema.  She was started on low-dose furosemide  and potassium.  She states that it has significantly helped her edema and has also reduced her nocturnal urination.  She still reports chronic fatigue.  She is hypertensive today with with similar readings at home.  Her heart rate has been running in the 70s to 90s.  She denies chest pain, shortness of breath, orthopnea, PND, palpitations, presyncope, syncope.    EKG today:  Sinus rhythm, left ventricular hypertrophy.    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    Insomnia 12/06/2019    CKD (chronic kidney disease), stage IV (CMS HCC) 12/06/2019    Chronic bilateral thoracic back pain 07/08/2018    Skin lesion 07/08/2018    OSA on CPAP 10/04/2015    B12 deficiency 06/22/2015    IDA (iron  deficiency anemia) 03/15/2015    Bilateral edema of lower extremity 10/10/2014    Fatigue 09/15/2013    Atrial fibrillation 04/08/2013    Hyperparathyroidism (CMS HCC) 03/18/2013    Vitamin D  deficiency 09/17/2012    Glucose intolerance (impaired glucose tolerance) 09/17/2012    Essential hypertension 04/10/2000    Hypothyroidism 04/10/2000    Osteoporosis 04/10/2000     Left hip -2.7 2021, spine -1.8-  PCP started Fosomax 2019      HLD (hyperlipidemia) 09/19/1998       Allergies  Allergies[1]    Medications  Current Medications[2]    History  Past Medical History:   Diagnosis Date    Anemia     Arthritis     Atrial fibrillation     CKD (chronic kidney disease)     Degenerative joint disease     Diverticulosis     Essential hypertension     H/O urinary frequency     History of fibrocystic disease of breast     History of nephrolithiasis     History of osteoporosis     History of retinopathy     Hypertension     Kidney stones     Lichen sclerosus et atrophicus 2016    left labial/periclitoral hood punch bx-tx with clobetasol    Mixed hyperlipidemia     Osteoporosis     -2.7 left hip, improved 2021, -1.8 spine    Paroxysmal atrial fibrillation (CMS HCC)     Rheumatoid arthritis      Sleep apnea     Thyroid  disease     Ventricular ectopy          Past Surgical History:   Procedure Laterality Date    BILATERAL CERVICAL TPI Bilateral 11/15/2024    Performed by [place] Evalene SAUNDERS, MD at [facility] MAIN    HX BREAST BIOPSY  2002    FNA x 3 bilaterally    HX CYST INCISION AND DRAINAGE      about 2000    HX HYSTERECTOMY  1980    TAH; Partial    HX PARATHYROIDECTOMY      HX PARTIAL THYROIDECTOMY      parathyroid  right lube    HX TONSIL AND ADENOIDECTOMY      HX TONSIL AND ADENOIDECTOMY      HX TUBAL LIGATION      HX WRIST FRACTURE TX Left     about 2004     LITHOTRIPSY       Social History     Socioeconomic History    Marital status: Married   Tobacco Use    Smoking status: Never    Smokeless tobacco: Never   Vaping Use    Vaping status: Never Used   Substance and Sexual Activity    Alcohol use: No     Alcohol/week: 0.0 standard drinks of alcohol    Drug use: Never    Sexual activity: Not Currently     Birth control/protection: Post-menopausal, Surgical     Family Medical History:       Problem Relation (Age of Onset)    Breast Cancer Daughter (10)    Cancer Other    Coronary Artery Disease Other    Diabetes Sister, Brother, Daughter    Hypertension (High Blood Pressure) Mother, Sister, Brother    Ovarian Cancer Maternal cousin (72)    Stroke Maternal Grandmother    Thyroid  Disease Sister, Brother              Review of Systems:  Review of Systems   Constitutional: Positive for malaise/fatigue. Negative for chills and fever.   Cardiovascular:  Positive for leg swelling. Negative for chest pain, near-syncope, orthopnea, palpitations, paroxysmal nocturnal dyspnea and syncope.   Respiratory:  Negative for cough and shortness of breath.    Endocrine: Negative for cold intolerance and heat intolerance.   Hematologic/Lymphatic: Negative for bleeding problem.   Musculoskeletal:  Positive for arthritis. Negative for gout.   Gastrointestinal:  Negative for abdominal pain, nausea and vomiting.    Neurological:  Negative for dizziness and weakness.   Psychiatric/Behavioral:  Negative for depression. The patient is not nervous/anxious.           Physical Examination:  BP (!) 174/92   Pulse 69   Ht 1.524 m (5')   Wt 76.7 kg (169 lb)   LMP 12/01/1980   SpO2 96%   BMI 33.01 kg/m         Physical Exam  Constitutional:       General: She is not in acute distress.  Neck:      Vascular: No carotid bruit.      Comments: No JVD  Cardiovascular:      Rate and Rhythm: Normal rate and regular rhythm.      Heart sounds: No murmur heard.  Pulmonary:      Effort: No respiratory distress.      Breath sounds: No wheezing, rhonchi or rales.   Abdominal:      General: There is no distension.      Palpations: Abdomen is soft.   Musculoskeletal:         General: Swelling (Trace bilateral lower extremity) present.   Neurological:      General: No focal deficit present.      Mental Status: She is alert.   Psychiatric:         Behavior: Behavior normal.             Orders Placed This Encounter    ECG - In Clinic (Non-[facility] )    carvedilol  (COREG ) 3.125 mg Oral Tablet       There are no discontinued medications.    Assessment and Plan:  Assessment/Plan   1. Paroxysmal atrial fibrillation (CMS HCC)    2. Ventricular ectopy    3. Essential hypertension    4. Mixed hyperlipidemia    5. Nonsmoker    6. OSA on CPAP    7. CKD (chronic kidney disease), stage IV (CMS HCC)    8. Hypothyroidism, unspecified type    9. On amiodarone  therapy        Cynthia Barrera appears to be doing well from a cardiac standpoint.  She has no history of coronary artery disease and has no anginal or anginal equivalent symptoms.  She is in sinus rhythm with no symptoms suggesting recurrent atrial fibrillation.  She is managed with amiodarone  and had recent pulmonary function testing.  These will need repeated every 6 months.  Primary care is following thyroid  function.  Blood pressure and heart rate are not adequately controlled.  We will add carvedilol  3.125  mg twice daily and titrate upward if needed.  She was asked to contact us  if there are any changes in her cardiac status.  She will follow up in 1 year or sooner if needed.      Thank you for allowing us  to participate in care of your patient.  If we can be of further assistance in her care at any time please let us  know.      Sincerely,     Morene LITTIE Miu, PA-C    Follow up:  Return in about 1 year (around 11/21/2025).        Morene Miu, PA-C  [facility]  Cardiology  [facility]    A portion of this documentation may have been generated using [facility] voice recognition software and may contain syntax/voice recognition errors.              [1]   Allergies  Allergen  Reactions    Adhesive Rash     Needs tape for SENSITIVE SKIN    Bactrim [Sulfamethoxazole-Trimethoprim]     Ciprofloxacin     Digoxin     Plaquenil [Hydroxychloroquine]     Simvastatin     Sulfa (Sulfonamides)    [2]   Current Outpatient Medications:     ACCRUFER 30 mg Oral Capsule, Take 1 Capsule (30 mg total) by mouth Twice daily, Disp: , Rfl:     acetaminophen  (ARTHRITIS PAIN RELIEF) 650 mg Oral Tablet Sustained Release, Take 1 Tablet (650 mg total) by mouth Every 8 hours as needed for Pain, Disp: , Rfl:     Alendronate  (FOSAMAX ) 35 mg Oral Tablet, Take 1 Tablet (35 mg total) by mouth Every 7 days, Disp: , Rfl:     amiodarone  (PACERONE ) 100 mg Oral Tablet, Take 1 Tablet (100 mg total) by mouth Daily, Disp: 90 Tablet, Rfl: 1    amLODIPine  (NORVASC) 5 mg Oral Tablet, Take 1 Tablet (5 mg total) by mouth Once a day (Patient taking differently: Take 2 Tablets (10 mg total) by mouth Daily), Disp: 90 Tablet, Rfl: 3    carvedilol  (COREG ) 3.125 mg Oral Tablet, Take 1 Tablet (3.125 mg total) by mouth Twice daily with food, Disp: 180 Tablet, Rfl: 3    cholecalciferol, vitamin D3, 1,000 unit Oral Tablet, Take 2 Tablets (2,000 Units total) by mouth Twice daily, Disp: , Rfl:     cloNIDine  HCL (CATAPRES ) 0.1 mg Oral Tablet, Take 1 Tablet (0.1  mg total) by mouth Twice daily, Disp: 180 Tablet, Rfl: 2    cyanocobalamin (VITAMIN B 12) 1,000 mcg Oral Tablet, Take 1 Tablet (1,000 mcg total) by mouth Daily, Disp: , Rfl:     ELIQUIS  2.5 mg Oral Tablet, Take 1 tablet by mouth twice daily (Patient taking differently: Take 2 Tablets (5 mg total) by mouth Twice daily), Disp: 180 Tablet, Rfl: 2    furosemide  (LASIX ) 20 mg Oral Tablet, Take 1 Tablet (20 mg total) by mouth Daily, Disp: 90 Tablet, Rfl: 3    levothyroxine  (SYNTHROID ) 75 mcg Oral Tablet, Take 1 Tab (75 mcg total) by mouth Every morning, Disp: 90 Tab, Rfl: 4    lisinopriL  (PRINIVIL ) 40 mg Oral Tablet, Take 1 Tablet (40 mg total) by mouth Daily, Disp: , Rfl:     polysaccharide iron  complex (FERREX 150) 150 mg iron  Oral Capsule, Take 1 capsule by mouth once daily, Disp: 90 Capsule, Rfl: 3    potassium chloride  (KLOR-CON ) 10 mEq Oral Tablet Sustained Release, Take 1 Tablet (10 mEq total) by mouth Daily, Disp: 90 Tablet, Rfl: 3

## 2024-11-29 ENCOUNTER — Encounter (HOSPITAL_BASED_OUTPATIENT_CLINIC_OR_DEPARTMENT_OTHER): Payer: Self-pay

## 2024-12-05 ENCOUNTER — Ambulatory Visit (HOSPITAL_BASED_OUTPATIENT_CLINIC_OR_DEPARTMENT_OTHER): Payer: Self-pay | Admitting: NURSE PRACTITIONER

## 2024-12-19 ENCOUNTER — Ambulatory Visit: Payer: Self-pay | Attending: NURSE PRACTITIONER | Admitting: NURSE PRACTITIONER

## 2024-12-19 ENCOUNTER — Other Ambulatory Visit: Payer: Self-pay

## 2024-12-19 ENCOUNTER — Encounter (HOSPITAL_BASED_OUTPATIENT_CLINIC_OR_DEPARTMENT_OTHER): Payer: Self-pay | Admitting: NURSE PRACTITIONER

## 2024-12-19 VITALS — BP 130/80 | HR 78 | Ht 60.0 in | Wt 169.0 lb

## 2024-12-19 DIAGNOSIS — G894 Chronic pain syndrome: Secondary | ICD-10-CM | POA: Insufficient documentation

## 2024-12-19 DIAGNOSIS — M4802 Spinal stenosis, cervical region: Secondary | ICD-10-CM | POA: Insufficient documentation

## 2024-12-19 DIAGNOSIS — M47812 Spondylosis without myelopathy or radiculopathy, cervical region: Secondary | ICD-10-CM | POA: Insufficient documentation

## 2024-12-19 NOTE — Nursing Note (Signed)
 FU to injection  Bilateral Cervical (Erector Spinae, Trapezius, and Splenius Capitis) Trigger Point Injection of three or more muscle groups with Ultrasound Guidance 11/15/24 Dr [place]   Neck pain described as frequent aching. Extends into shoulders .  Eases with sitting with back against chair, tens unit, heating pad   Aggravated by bending over   Current pain 1/10 when sitting worst 9/10, least 1 /10  Taking tylenol  - providing some pain relief.  Cynthia Birmingham MA                    12/19/2024   NECK DISABILITY INDEX   Pain Intensity 4   Personal Care (Washing, Dressing, etc.) 2   Lifting 4   Reading 2   Headaches 0   Concentration 0   Work 3   Driving 5   Sleeping 0   Recreation 5   Score 25   Neck Disability Percentage 50     ,       12/19/2024   Conservative Therapies   Type of Injury None   Has the patient participated in Physical Therapy? No   Has the patient participated in Chiropractic Manipulation? Yes   Dates of chiropractic manipulation June-Aug 2025   Actively participating in chiropractic manipulation No   Any relief with chiropractic manipulation No   Has the patient participated in Home Exercise? Yes   Dates of home exercise Daily   Actively participating in home exercise Yes   Any relief with home exercise No   Additional Previous Treatments None   Previous Medications Acetaminophen    Surgical Eval No

## 2024-12-19 NOTE — H&P (Signed)
 PAIN MANAGEMENT, [facility]  [address]  [place] [address]  Operated by [facility]  History and Physical    Name: Cynthia Barrera MRN:  [id number]   Date: 12/19/2024 DOB:  Nov 26, 1941 (84 y.o.)               Provider: Geni Bars, APRN, CNP  PCP: Delon Silversmith, DO  Referring Provider: Delon Silversmith     Reason for visit: Neck Pain      Objective:  Nursing Notes:   Waddell Savers, MA  12/19/24 [id number]  Signed  FU to injection  Bilateral Cervical (Erector Spinae, Trapezius, and Splenius Capitis) Trigger Point Injection of three or more muscle groups with Ultrasound Guidance 11/15/24 Dr Lahoma   Neck pain described as frequent aching. Extends into shoulders .  Eases with sitting with back against chair, tens unit, heating pad   Aggravated by bending over   Current pain 1/10 when sitting worst 9/10, least 1 /10  Taking tylenol  - providing some pain relief.  JAYSON Waddell MA                    12/19/2024   NECK DISABILITY INDEX   Pain Intensity 4   Personal Care (Washing, Dressing, etc.) 2   Lifting 4   Reading 2   Headaches 0   Concentration 0   Work 3   Driving 5   Sleeping 0   Recreation 5   Score 25   Neck Disability Percentage 50     ,       12/19/2024   Conservative Therapies   Type of Injury None   Has the patient participated in Physical Therapy? No   Has the patient participated in Chiropractic Manipulation? Yes   Dates of chiropractic manipulation June-Aug 2025   Actively participating in chiropractic manipulation No   Any relief with chiropractic manipulation No   Has the patient participated in Home Exercise? Yes   Dates of home exercise Daily   Actively participating in home exercise Yes   Any relief with home exercise No   Additional Previous Treatments None   Previous Medications Acetaminophen    Surgical Eval No       HPI:  Patient here for a follow up after having bilateral cervical TPI. Patient states they received 80% relief with improvement in pain,  function, ROM, and mobility. States her relief was only down around her scapulas that she didn't receive any relief in her neck. Patient complains of neck pain that is aching, throbbing pain that goes into her shoulders. Patient states that the pain is worse with walking, standing, rotation, flexion, and extension. Patient states that when she is sitting or laying down the pain isn't there.     The history is provided by the patient.   Neck Pain   This is a chronic problem. The current episode started more than 1 year ago. The problem occurs constantly. The problem has been unchanged. The pain is associated with nothing. The pain is present in the left side, midline and right side. The quality of the pain is described as stabbing, shooting, burning and aching. The pain is moderate. The symptoms are aggravated by bending, position, twisting and stress. She has tried bed rest, acetaminophen , muscle relaxants, ice, home exercises, heat and NSAIDs for the symptoms. The treatment provided moderate relief.       Past Medical History:  Past Medical History:   Diagnosis Date  Anemia     Arthritis     Atrial fibrillation     CKD (chronic kidney disease)     Degenerative joint disease     Diverticulosis     Essential hypertension     H/O urinary frequency     History of fibrocystic disease of breast     History of nephrolithiasis     History of osteoporosis     History of retinopathy     Hypertension     Kidney stones     Lichen sclerosus et atrophicus 2016    left labial/periclitoral hood punch bx-tx with clobetasol    Mixed hyperlipidemia     Osteoporosis     -2.7 left hip, improved 2021, -1.8 spine    Paroxysmal atrial fibrillation (CMS HCC)     Rheumatoid arthritis     Sleep apnea     Thyroid  disease     Ventricular ectopy      Past Surgical History:   Procedure Laterality Date    HX BREAST BIOPSY  2002    FNA x 3 bilaterally    HX CYST INCISION AND DRAINAGE      about 2000    HX HYSTERECTOMY  1980    TAH; Partial    HX  PARATHYROIDECTOMY      HX PARTIAL THYROIDECTOMY      parathyroid  right lube    HX TONSIL AND ADENOIDECTOMY      HX TONSIL AND ADENOIDECTOMY      HX TUBAL LIGATION      HX WRIST FRACTURE TX Left     about 2004     LITHOTRIPSY      TRIGGER POINT INJECTION Bilateral 11/15/2024    Bilateral Cervical Trigger Point Injection      Allergies[1]  Current Outpatient Medications   Medication Sig    ACCRUFER 30 mg Oral Capsule Take 1 Capsule (30 mg total) by mouth Twice daily    acetaminophen  (ARTHRITIS PAIN RELIEF) 650 mg Oral Tablet Sustained Release Take 1 Tablet (650 mg total) by mouth Every 8 hours as needed for Pain    Alendronate  (FOSAMAX ) 35 mg Oral Tablet Take 1 Tablet (35 mg total) by mouth Every 7 days    amiodarone  (PACERONE ) 100 mg Oral Tablet Take 1 Tablet (100 mg total) by mouth Daily    amLODIPine  (NORVASC) 5 mg Oral Tablet Take 1 Tablet (5 mg total) by mouth Once a day    carvedilol  (COREG ) 3.125 mg Oral Tablet Take 1 Tablet (3.125 mg total) by mouth Twice daily with food    cholecalciferol, vitamin D3, 1,000 unit Oral Tablet Take 2 Tablets (2,000 Units total) by mouth Twice daily    cloNIDine  HCL (CATAPRES ) 0.1 mg Oral Tablet Take 1 Tablet (0.1 mg total) by mouth Twice daily    cyanocobalamin (VITAMIN B 12) 1,000 mcg Oral Tablet Take 1 Tablet (1,000 mcg total) by mouth Daily    ELIQUIS  2.5 mg Oral Tablet Take 1 tablet by mouth twice daily    furosemide  (LASIX ) 20 mg Oral Tablet Take 1 Tablet (20 mg total) by mouth Daily    levothyroxine  (SYNTHROID ) 75 mcg Oral Tablet Take 1 Tab (75 mcg total) by mouth Every morning    lisinopriL  (PRINIVIL ) 40 mg Oral Tablet Take 1 Tablet (40 mg total) by mouth Daily    polysaccharide iron  complex (FERREX 150) 150 mg iron  Oral Capsule Take 1 capsule by mouth once daily    potassium chloride  (KLOR-CON ) 10 mEq Oral Tablet Sustained Release  Take 1 Tablet (10 mEq total) by mouth Daily     Family Medical History:       Problem Relation (Age of Onset)    Breast Cancer Daughter (92)     Cancer Other    Coronary Artery Disease Other    Diabetes Sister, Brother, Daughter    Hypertension (High Blood Pressure) Mother, Sister, Brother    Ovarian Cancer Maternal cousin (72)    Stroke Maternal Grandmother    Thyroid  Disease Sister, Brother           Social History     Socioeconomic History    Marital status: Married   Tobacco Use    Smoking status: Never    Smokeless tobacco: Never   Vaping Use    Vaping status: Never Used   Substance and Sexual Activity    Alcohol use: No     Alcohol/week: 0.0 standard drinks of alcohol    Drug use: Never    Sexual activity: Not Currently     Birth control/protection: Post-menopausal, Surgical       Physical Exam:  Vital Signs:  BP 130/80   Pulse 78   Ht 1.524 m (5')   Wt 76.7 kg (169 lb)   LMP 12/01/1980   BMI 33.01 kg/m         Physical Exam  Vitals and nursing note reviewed.   Musculoskeletal:      Cervical back: Spasms, tenderness, bony tenderness and crepitus present. Pain with movement present. Decreased range of motion.         Assessment:    ICD-10-CM    1. 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 CASE REQUEST SURGICAL: BILATERAL C4, C5, C6 (to treat C4-5, C5-6 Levels) MBNB via Fluoroscopic Guidance - DIAGNOSTIC, BILATERAL C4, C5, C6 (to treat C4-5, C5-6 Levels) MBNB via Fluoroscopic Guidance - DIAGNOSTIC      2. Cervical spondylosis  M47.812       3. Chronic pain syndrome  G89.4 CASE REQUEST SURGICAL: BILATERAL C4, C5, C6 (to treat C4-5, C5-6 Levels) MBNB via Fluoroscopic Guidance - DIAGNOSTIC, BILATERAL C4, C5, C6 (to treat C4-5, C5-6 Levels) MBNB via Fluoroscopic Guidance - DIAGNOSTIC      4. Cervical spinal stenosis  M48.02            Plan:  Orders Placed This Encounter    CASE REQUEST SURGICAL: BILATERAL C4, C5, C6 (to treat C4-5, C5-6 Levels) MBNB via Fluoroscopic Guidance - DIAGNOSTIC, BILATERAL C4, C5, C6 (to treat C4-5, C5-6 Levels) MBNB via Fluoroscopic Guidance - DIAGNOSTIC     Patient has tried and failed > 6 month of conservative  treatment such as exercise, physical therapy and/or chiropratic care, NSAID;s and/or muscle relaxants.   Patient QOL and ability to complete ADL's continues to worsen, leading to worsening pain and decreased function  Treatment options were discussed with patient including benefits vs risk.  Patient denies any new neurological s/s changes including bladder or bowel dysfunction.   Patient will call for appointment if pain level changes for the worse.   I will discuss physical therapy, injection therapy, or medication changes if necessary.   Long term goal is to improve pain, function, and quality of life with minimally invasive procedures.   Shared discussion regarding decision-making took place. This risk and benefits of the procedure were discussed with the patient. All questions were addressed, and the patient wishes to proceed.   Will order bilateral cervical MBNB @ C4, C5, C6 to treat C4-5, C5-6  Can repeat  injection   Consider RFA unilaterally 2 weeks apart   Follow up after injection    Return after injections, for In Person Visit.    Geni Bars, APRN, CNP     Portions of this note may be dictated using voice recognition software or a dictation service. Variances in spelling and vocabulary are possible and unintentional. Not all errors are caught/corrected. Please notify the dino if any discrepancies are noted or if the meaning of any statement is not clear.        [1]   Allergies  Allergen Reactions    Adhesive Rash     Needs tape for SENSITIVE SKIN    Bactrim [Sulfamethoxazole-Trimethoprim]     Ciprofloxacin     Digoxin     Plaquenil [Hydroxychloroquine]     Simvastatin     Sulfa (Sulfonamides)

## 2025-01-23 DIAGNOSIS — G894 Chronic pain syndrome: Secondary | ICD-10-CM | POA: Insufficient documentation

## 2025-01-23 DIAGNOSIS — M47812 Spondylosis without myelopathy or radiculopathy, cervical region: Secondary | ICD-10-CM | POA: Insufficient documentation

## 2025-01-24 ENCOUNTER — Ambulatory Visit: Admission: RE | Admit: 2025-01-24 | Admitting: PAIN MANAGEMENT

## 2025-01-24 ENCOUNTER — Encounter (HOSPITAL_BASED_OUTPATIENT_CLINIC_OR_DEPARTMENT_OTHER): Admission: RE | Payer: Self-pay

## 2025-01-24 DIAGNOSIS — G894 Chronic pain syndrome: Secondary | ICD-10-CM | POA: Insufficient documentation

## 2025-01-24 DIAGNOSIS — M47812 Spondylosis without myelopathy or radiculopathy, cervical region: Secondary | ICD-10-CM | POA: Insufficient documentation

## 2025-02-01 ENCOUNTER — Ambulatory Visit (HOSPITAL_BASED_OUTPATIENT_CLINIC_OR_DEPARTMENT_OTHER): Payer: Self-pay | Admitting: NURSE PRACTITIONER

## 2025-09-25 ENCOUNTER — Ambulatory Visit: Admitting: Internal Medicine

## 2025-11-21 ENCOUNTER — Encounter (INDEPENDENT_AMBULATORY_CARE_PROVIDER_SITE_OTHER): Payer: Self-pay
